# Patient Record
Sex: Female | Born: 2000
Health system: Southern US, Community
[De-identification: ages and names within clinical notes are randomized; demographics above are authoritative.]

## PROBLEM LIST (undated history)

## (undated) DIAGNOSIS — O139 Gestational [pregnancy-induced] hypertension without significant proteinuria, unspecified trimester: Secondary | ICD-10-CM

## (undated) DIAGNOSIS — E0590 Thyrotoxicosis, unspecified without thyrotoxic crisis or storm: Secondary | ICD-10-CM

## (undated) DIAGNOSIS — F419 Anxiety disorder, unspecified: Secondary | ICD-10-CM

## (undated) HISTORY — DX: Anxiety disorder, unspecified: F41.9

## (undated) HISTORY — PX: MYRINGOTOMY: SUR874

## (undated) HISTORY — PX: ANKLE RECONSTRUCTION: SHX1151

## (undated) HISTORY — DX: Gestational (pregnancy-induced) hypertension without significant proteinuria, unspecified trimester: O13.9

## (undated) HISTORY — DX: Thyrotoxicosis, unspecified without thyrotoxic crisis or storm: E05.90

---
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
## 2020-01-10 ENCOUNTER — Telehealth: Payer: BC Managed Care – PPO

## 2020-01-10 ENCOUNTER — Telehealth (INDEPENDENT_AMBULATORY_CARE_PROVIDER_SITE_OTHER): Payer: BC Managed Care – PPO | Admitting: Family

## 2020-01-10 ENCOUNTER — Encounter: Payer: Self-pay | Admitting: Family

## 2020-01-10 DIAGNOSIS — J029 Acute pharyngitis, unspecified: Secondary | ICD-10-CM | POA: Diagnosis not present

## 2020-01-10 MED ORDER — AMOXICILLIN 875 MG PO TABS: 875.0000 mg | ORAL_TABLET | Freq: Two times a day (BID) | ORAL | 0 refills | Status: DC

## 2020-01-10 NOTE — Progress Notes (Addendum)
   Virtual Visit via telephone Note Due to COVID-19 pandemic this visit was conducted virtually. This visit type was conducted due to national recommendations for restrictions regarding the COVID-19 Pandemic (e.g. social distancing, sheltering in place) in an effort to limit this patient's exposure and mitigate transmission in our community. All issues noted in this document were discussed and addressed.  A physical exam was not performed with this format.  I connected with Kristen Mejia on 01/10/20 at 9:40 AM  by video and verified that I am speaking with the correct person using two identifiers. Kristen Mejia is currently located at home and no one is currently with her during visit. The provider, [name], FNP is located in their office at time of visit.  I discussed the limitations, risks, security and privacy concerns of performing an evaluation and management service by telephone and the availability of in person appointments. I also discussed with the patient that there may be a patient responsible charge related to this service. The patient expressed understanding and agreed to proceed.   History and Present Illness:  Sore Throat  This is a new problem. The current episode started yesterday. The problem has been gradually worsening. The pain is worse on the right side. There has been no fever. The pain is at a severity of 5/10. The pain is moderate. Associated symptoms include congestion, coughing, headaches, swollen glands and trouble swallowing. She has tried acetaminophen for the symptoms. The treatment provided mild relief.      Review of Systems  HENT: Positive for congestion and trouble swallowing.   Respiratory: Positive for cough.   Neurological: Positive for headaches.  All other systems reviewed and are negative.    Observations/Objective: No SOB or distress noted,   Assessment and Plan: 1. Acute pharyngitis, unspecified etiology - Take meds as prescribed - Use a  cool mist humidifier  -Use saline nose sprays frequently -Force fluids -For any cough or congestion  Use plain Mucinex- regular strength or max strength is fine -For fever or aces or pains- take tylenol or ibuprofen. -Throat lozenges if help -New toothbrush in 3 days RTO if symptoms worsen or do not improve  - amoxicillin (AMOXIL) 875 MG tablet; Take 1 tablet (875 mg total) by mouth 2 (two) times daily.  Dispense: 14 tablet; Refill: 0    I discussed the assessment and treatment plan with the patient. The patient was provided an opportunity to ask questions and all were answered. The patient agreed with the plan and demonstrated an understanding of the instructions.   The patient was advised to call back or seek an in-person evaluation if the symptoms worsen or if the condition fails to improve as anticipated.  The above assessment and management plan was discussed with the patient. The patient verbalized understanding of and has agreed to the management plan. Patient is aware to call the clinic if symptoms persist or worsen. Patient is aware when to return to the clinic for a follow-up visit. Patient educated on when it is appropriate to go to the emergency department.   Time call ended: 9:50 AM     I provided 10 minutes of -face-to-face time during this encounter.    [name], FNP

## 2020-01-11 ENCOUNTER — Other Ambulatory Visit: Payer: Self-pay | Admitting: Family

## 2020-01-12 DIAGNOSIS — J984 Other disorders of lung: Secondary | ICD-10-CM | POA: Diagnosis not present

## 2020-01-12 DIAGNOSIS — J029 Acute pharyngitis, unspecified: Secondary | ICD-10-CM | POA: Diagnosis not present

## 2020-01-12 DIAGNOSIS — R05 Cough: Secondary | ICD-10-CM | POA: Diagnosis not present

## 2020-01-15 ENCOUNTER — Telehealth: Payer: Self-pay | Admitting: Family

## 2020-01-15 NOTE — Telephone Encounter (Signed)
I sent a work note to Pharmacologist.

## 2020-01-15 NOTE — Telephone Encounter (Signed)
NA to let pt know- VM full

## 2020-01-30 ENCOUNTER — Ambulatory Visit: Payer: BC Managed Care – PPO

## 2020-01-31 ENCOUNTER — Telehealth (INDEPENDENT_AMBULATORY_CARE_PROVIDER_SITE_OTHER): Payer: BC Managed Care – PPO | Admitting: Family Medicine

## 2020-01-31 ENCOUNTER — Encounter: Payer: Self-pay | Admitting: Family Medicine

## 2020-01-31 DIAGNOSIS — A084 Viral intestinal infection, unspecified: Secondary | ICD-10-CM | POA: Diagnosis not present

## 2020-01-31 MED ORDER — ONDANSETRON 4 MG PO TBDP: 4.0000 mg | ORAL_TABLET | Freq: Three times a day (TID) | ORAL | 0 refills | Status: DC | PRN

## 2020-01-31 NOTE — Progress Notes (Signed)
Virtual Visit via Video note  I connected with Kristen Mejia on 01/31/20 at 5:15 PM by video and verified that I am speaking with the correct person using two identifiers. Kristen Mejia is currently located at home and nobody is currently with her during visit. The provider, Loman Brooklyn, FNP is located in their office at time of visit.  I discussed the limitations, risks, security and privacy concerns of performing an evaluation and management service by video and the availability of in person appointments. I also discussed with the patient that there may be a patient responsible charge related to this service. The patient expressed understanding and agreed to proceed.  Subjective: PCP: Sharion Balloon, FNP  Chief Complaint  Patient presents with  . Abdominal Pain  . Emesis   Patient reports vomiting that started Monday night, 3 days ago.  She has also had a fever 100-101, which has resolved as of today.  Diarrhea started yesterday.  She has not taken any medications at home.   ROS: Per HPI  Current Outpatient Medications:  .  ondansetron (ZOFRAN ODT) 4 MG disintegrating tablet, Take 1 tablet (4 mg total) by mouth every 8 (eight) hours as needed for nausea or vomiting., Disp: 30 tablet, Rfl: 0  No Known Allergies History reviewed. No pertinent past medical history.  Observations/Objective: Physical Exam Constitutional:      General: She is not in acute distress.    Appearance: Normal appearance. She is not ill-appearing or toxic-appearing.  Eyes:     General: No scleral icterus.       Right eye: No discharge.        Left eye: No discharge.     Conjunctiva/sclera: Conjunctivae normal.  Pulmonary:     Effort: Pulmonary effort is normal. No respiratory distress.  Neurological:     Mental Status: She is alert and oriented to person, place, and time.  Psychiatric:        Mood and Affect: Mood normal.        Behavior: Behavior normal.        Thought Content: Thought  content normal.        Judgment: Judgment normal.    Assessment and Plan: 1. Viral gastroenteritis - Advised to stop N/V with Zofran so that she can stay hydrated but not to try to stop her diarrhea.  Tylenol/ibuprofen for fever.  Note provided for her as she has missed work since Monday.  She does plan to return tomorrow. - ondansetron (ZOFRAN ODT) 4 MG disintegrating tablet; Take 1 tablet (4 mg total) by mouth every 8 (eight) hours as needed for nausea or vomiting.  Dispense: 30 tablet; Refill: 0   Follow Up Instructions:   I discussed the assessment and treatment plan with the patient. The patient was provided an opportunity to ask questions and all were answered. The patient agreed with the plan and demonstrated an understanding of the instructions.   The patient was advised to call back or seek an in-person evaluation if the symptoms worsen or if the condition fails to improve as anticipated.  The above assessment and management plan was discussed with the patient. The patient verbalized understanding of and has agreed to the management plan. Patient is aware to call the clinic if symptoms persist or worsen. Patient is aware when to return to the clinic for a follow-up visit. Patient educated on when it is appropriate to go to the emergency department.   Time call ended: 5:24 PM  I provided  11 minutes of face-to-face time during this encounter.   Deliah Boston, MSN, APRN, FNP-C [facility] 01/31/20

## 2020-02-01 ENCOUNTER — Ambulatory Visit: Payer: BC Managed Care – PPO | Admitting: Physician Assistant

## 2020-02-01 ENCOUNTER — Telehealth: Payer: Self-pay | Admitting: Family

## 2020-02-01 ENCOUNTER — Encounter: Payer: Self-pay | Admitting: Family Medicine

## 2020-02-01 ENCOUNTER — Encounter: Payer: Self-pay | Admitting: *Deleted

## 2020-02-01 NOTE — Telephone Encounter (Signed)
Request for extended note sent through My Chart message also.

## 2020-02-01 NOTE — Telephone Encounter (Signed)
Note ready

## 2020-02-01 NOTE — Telephone Encounter (Signed)
Ok to extend note 

## 2020-02-02 DIAGNOSIS — R531 Weakness: Secondary | ICD-10-CM | POA: Diagnosis not present

## 2020-02-02 DIAGNOSIS — R000 Tachycardia, unspecified: Secondary | ICD-10-CM | POA: Diagnosis not present

## 2020-02-02 DIAGNOSIS — E860 Dehydration: Secondary | ICD-10-CM | POA: Diagnosis not present

## 2020-02-02 DIAGNOSIS — K2900 Acute gastritis without bleeding: Secondary | ICD-10-CM | POA: Diagnosis not present

## 2020-02-02 DIAGNOSIS — R1013 Epigastric pain: Secondary | ICD-10-CM | POA: Diagnosis not present

## 2020-02-02 DIAGNOSIS — K920 Hematemesis: Secondary | ICD-10-CM | POA: Diagnosis not present

## 2020-02-02 DIAGNOSIS — R112 Nausea with vomiting, unspecified: Secondary | ICD-10-CM | POA: Diagnosis not present

## 2020-02-05 ENCOUNTER — Other Ambulatory Visit: Payer: Self-pay | Admitting: Family

## 2020-02-05 DIAGNOSIS — R112 Nausea with vomiting, unspecified: Secondary | ICD-10-CM | POA: Diagnosis not present

## 2020-02-05 DIAGNOSIS — R109 Unspecified abdominal pain: Secondary | ICD-10-CM | POA: Diagnosis not present

## 2020-02-12 ENCOUNTER — Encounter: Payer: Self-pay | Admitting: Internal Medicine

## 2020-02-18 ENCOUNTER — Encounter: Payer: Self-pay | Admitting: Family Medicine

## 2020-02-18 ENCOUNTER — Ambulatory Visit (INDEPENDENT_AMBULATORY_CARE_PROVIDER_SITE_OTHER): Payer: BC Managed Care – PPO | Admitting: Family Medicine

## 2020-02-18 ENCOUNTER — Other Ambulatory Visit: Payer: Self-pay

## 2020-02-18 ENCOUNTER — Other Ambulatory Visit (HOSPITAL_COMMUNITY)
Admission: RE | Admit: 2020-02-18 | Discharge: 2020-02-18 | Disposition: A | Discharge status: Home / Self Care | Payer: BC Managed Care – PPO | Source: Ambulatory Visit | Attending: Family Medicine | Admitting: Family Medicine

## 2020-02-18 VITALS — BP 112/72 | HR 87 | Temp 98.0°F | Resp 20 | Ht 62.0 in | Wt 130.2 lb

## 2020-02-18 DIAGNOSIS — N898 Other specified noninflammatory disorders of vagina: Secondary | ICD-10-CM | POA: Diagnosis not present

## 2020-02-18 NOTE — Progress Notes (Signed)
Chief Complaint  Patient presents with  . Vaginal irritation    HPI  Patient presents today for vaginal itching for 2 to 3 days. Last sexual activity was 3 to 4 days ago with no protection. She has been with the same guy for 2 to 3 years. She is on her period now. Denies discharge. Denies dysuria.  PMH: Smoking status noted ROS: Per HPI  Objective: BP 112/72   Pulse 87   Temp 98 F (36.7 C) (Temporal)   Resp 20   Ht 5\' 2"  (1.575 m)   Wt 130 lb 4 oz (59.1 kg)   SpO2 99%   BMI 23.82 kg/m  Gen: NAD, alert, cooperative with exam HEENT: NCAT, EOMI, PERRL CV: RRR, good S1/S2, no murmur Resp: CTABL, no wheezes, non-labored Genitourinary: Pelvic exam shows moderate blood in the vaginal vault with small amounts coming from the cervix. Cervical specimen was obtained for evaluation. Ext: No edema, warm Neuro: Alert and oriented, No gross deficits  Assessment and plan:  1. Vaginal irritation     No orders of the defined types were placed in this encounter.   Orders Placed This Encounter  Procedures  . WET PREP FOR TRICH, YEAST, CLUE   For now she should use pads instead of tampons. Report any change in her condition. Will contact her with test results and treatment as needed. Follow up as needed.  , MD

## 2020-02-19 LAB — WET PREP FOR TRICH, YEAST, CLUE
Clue Cell Exam: NEGATIVE
Trichomonas Exam: NEGATIVE
Yeast Exam: NEGATIVE

## 2020-02-20 LAB — GC/CHLAMYDIA PROBE AMP (~~LOC~~) NOT AT ARMC
Chlamydia: NEGATIVE
Comment: NEGATIVE
Comment: NORMAL
Neisseria Gonorrhea: NEGATIVE

## 2020-03-10 ENCOUNTER — Telehealth: Payer: Self-pay | Admitting: Family

## 2020-03-11 NOTE — Telephone Encounter (Signed)
See MyChart

## 2020-03-19 ENCOUNTER — Ambulatory Visit: Payer: BC Managed Care – PPO | Admitting: Internal Medicine

## 2020-03-19 ENCOUNTER — Encounter: Payer: Self-pay | Admitting: Internal Medicine

## 2020-04-11 ENCOUNTER — Other Ambulatory Visit: Payer: Self-pay | Admitting: Family

## 2020-04-11 DIAGNOSIS — Z202 Contact with and (suspected) exposure to infections with a predominantly sexual mode of transmission: Secondary | ICD-10-CM

## 2020-06-24 ENCOUNTER — Telehealth: Payer: Self-pay

## 2020-06-24 ENCOUNTER — Encounter: Payer: Self-pay | Admitting: Family

## 2020-06-24 ENCOUNTER — Ambulatory Visit (INDEPENDENT_AMBULATORY_CARE_PROVIDER_SITE_OTHER): Payer: BC Managed Care – PPO | Admitting: Family

## 2020-06-24 ENCOUNTER — Other Ambulatory Visit: Payer: Self-pay

## 2020-06-24 VITALS — BP 110/73 | HR 54 | Temp 97.5°F | Ht 62.0 in | Wt 128.2 lb

## 2020-06-24 DIAGNOSIS — Z23 Encounter for immunization: Secondary | ICD-10-CM

## 2020-06-24 DIAGNOSIS — B009 Herpesviral infection, unspecified: Secondary | ICD-10-CM

## 2020-06-24 DIAGNOSIS — F411 Generalized anxiety disorder: Secondary | ICD-10-CM

## 2020-06-24 MED ORDER — FLUOXETINE HCL 20 MG PO TABS: 20.0000 mg | ORAL_TABLET | Freq: Every day | ORAL | 3 refills | Status: DC

## 2020-06-24 MED ORDER — BUSPIRONE HCL 5 MG PO TABS: 5.0000 mg | ORAL_TABLET | Freq: Three times a day (TID) | ORAL | 1 refills | Status: DC | PRN

## 2020-06-24 NOTE — Telephone Encounter (Signed)
Covermymeds  No Prior Authorization is required at this time based on the alternative drug you chose to prescribe.;CaseId:[id number];Status:Cancelled;  Prozac/Fluoxetine.

## 2020-06-24 NOTE — Progress Notes (Signed)
   Subjective:    Patient ID: Kristen Mejia, female    DOB: 08-30-2000, 19 y.o.   MRN: [id number]  Chief Complaint  Patient presents with  . Anxiety    was on generic prozac and did not like    Pt presents to the office today to discuss GAD. She tried Lexapro, but states she did not feel like this really helped.   PT also requesting to retest STD panel. She had a positive HSV 2 and wants to make sure this is accurate.  Anxiety Presents for follow-up visit. Symptoms include excessive worry, irritability, nervous/anxious behavior and restlessness. Symptoms occur occasionally. The severity of symptoms is moderate.        Review of Systems  Constitutional: Positive for irritability.  Psychiatric/Behavioral: The patient is nervous/anxious.   All other systems reviewed and are negative.      Objective:   Physical Exam Vitals reviewed.  Constitutional:      General: She is not in acute distress.    Appearance: She is well-developed.  HENT:     Head: Normocephalic and atraumatic.     Right Ear: Tympanic membrane normal.     Left Ear: Tympanic membrane normal.  Eyes:     Pupils: Pupils are equal, round, and reactive to light.  Neck:     Thyroid: No thyromegaly.  Cardiovascular:     Rate and Rhythm: Normal rate and regular rhythm.     Heart sounds: Normal heart sounds. No murmur heard.   Pulmonary:     Effort: Pulmonary effort is normal. No respiratory distress.     Breath sounds: Normal breath sounds. No wheezing.  Abdominal:     General: Bowel sounds are normal. There is no distension.     Palpations: Abdomen is soft.     Tenderness: There is no abdominal tenderness.  Musculoskeletal:        General: No tenderness. Normal range of motion.     Cervical back: Normal range of motion and neck supple.  Skin:    General: Skin is warm and dry.  Neurological:     Mental Status: She is alert and oriented to person, place, and time.     Cranial Nerves: No cranial nerve deficit.      Deep Tendon Reflexes: Reflexes are normal and symmetric.  Psychiatric:        Behavior: Behavior normal.        Thought Content: Thought content normal.        Judgment: Judgment normal.       BP 110/73   Pulse (!) 54   Temp (!) 97.5 F (36.4 C) (Temporal)   Ht 5\' 2"  (1.575 m)   Wt 128 lb 3.2 oz (58.2 kg)   BMI 23.45 kg/m      Assessment & Plan:  Kristen Mejia comes in today with chief complaint of Anxiety (was on generic lexapro and did not like )   Diagnosis and orders addressed:  1. GAD (generalized anxiety disorder) Will start Prozac 20 mg today  Buspar 5 mg as needed Stress management  - FLUoxetine (PROZAC) 20 MG tablet; Take 1 tablet (20 mg total) by mouth daily.  Dispense: 90 tablet; Refill: 3 - busPIRone (BUSPAR) 5 MG tablet; Take 1 tablet (5 mg total) by mouth 3 (three) times daily as needed.  Dispense: 90 tablet; Refill: 1  2. Herpes  - STD Screen (8)  [name], FNP

## 2020-06-24 NOTE — Patient Instructions (Signed)

## 2020-06-25 ENCOUNTER — Telehealth: Payer: Self-pay | Admitting: *Deleted

## 2020-06-25 ENCOUNTER — Telehealth: Payer: Self-pay

## 2020-06-25 LAB — STD SCREEN (8)
HIV Screen 4th Generation wRfx: NONREACTIVE
HSV 1 Glycoprotein G Ab, IgG: <0.91 index (ref 0.00–0.90)
HSV 2 IgG, Type Spec: <0.91 index (ref 0.00–0.90)
Hep A IgM: NEGATIVE
Hep B C IgM: NEGATIVE
Hep C Virus Ab: 0.1 s/co ratio (ref 0.0–0.9)
Hepatitis B Surface Ag: NEGATIVE
RPR Ser Ql: NONREACTIVE

## 2020-06-25 MED ORDER — FLUOXETINE HCL 20 MG PO CAPS
20.0000 mg | ORAL_CAPSULE | Freq: Every day | ORAL | 1 refills | Status: DC
Start: 2020-06-25 — End: 2020-09-10

## 2020-06-25 NOTE — Addendum Note (Signed)
Addended by: [name] A on: 06/25/2020 01:08 PM   Modules accepted: Orders

## 2020-06-25 NOTE — Telephone Encounter (Signed)
Rx changed and sent to pharmacy.  °

## 2020-06-25 NOTE — Telephone Encounter (Signed)
PA is not needed for capsules, Rx is written for tablets Please advise on changing to capsules

## 2020-06-25 NOTE — Telephone Encounter (Signed)
See result note.  

## 2020-07-15 ENCOUNTER — Telehealth (INDEPENDENT_AMBULATORY_CARE_PROVIDER_SITE_OTHER): Payer: BC Managed Care – PPO | Admitting: Nurse Practitioner

## 2020-07-15 DIAGNOSIS — G43819 Other migraine, intractable, without status migrainosus: Secondary | ICD-10-CM

## 2020-07-15 MED ORDER — SUMATRIPTAN SUCCINATE 50 MG PO TABS
50.0000 mg | ORAL_TABLET | ORAL | 0 refills | Status: DC | PRN
Start: 2020-07-15 — End: 2020-09-10

## 2020-07-15 NOTE — Progress Notes (Signed)
Virtual Visit via video Note   Due to COVID-19 pandemic this visit was conducted virtually. This visit type was conducted due to national recommendations for restrictions regarding the COVID-19 Pandemic (e.g. social distancing, sheltering in place) in an effort to limit this patient's exposure and mitigate transmission in our community. All issues noted in this document were discussed and addressed.  A physical exam was not performed with this format.  I connected with  Kristen Mejia  on 07/15/20 at 1:05 by video and verified that I am speaking with the correct person using two identifiers. Kristen Mejia is currently located at home and nonoe is currently with her during visit. The provider, Mary-Margaret Daphine Deutscher, FNP is located in their office at time of visit.  I discussed the limitations, risks, security and privacy concerns of performing an evaluation and management service by video  and the availability of in person appointments. I also discussed with the patient that there may be a patient responsible charge related to this service. The patient expressed understanding and agreed to proceed.   History and Present Illness:   Chief Complaint: Migraine   HPI Patient being seen today for migraine. Started yesterday morning at about 6:30. She was on buspar and prozac and she accidently threw it away about 5 days ago and wonders if that is what has caused headache. She has been taking goody powders and BC , Excedrin and motrin and has not helped. Pain is located in the front toward the right temple . Rates pain 6/10 currently. Has slight nausea.  Review of Systems  Eyes: Positive for photophobia. Negative for blurred vision and double vision.  Respiratory: Negative.   Cardiovascular: Negative.   Neurological: Positive for dizziness and headaches.  Psychiatric/Behavioral: Negative.        Observations/Objective: Alert and oriented- answers all questions appropriately No  distress Pupils equal * lost video so could not do cranial nerve assessment   Assessment and Plan: Kristen Mejia in today with chief complaint of Migraine   1. Other migraine without status migrainosus, intractable Rest Turn lights off Cool rag  To head Meds ordered this encounter  Medications  . SUMAtriptan (IMITREX) 50 MG tablet    Sig: Take 1 tablet (50 mg total) by mouth every 2 (two) hours as needed for migraine. May repeat in 2 hours if headache persists or recurs.    Dispense:  10 tablet    Refill:  0    Order Specific Question:   Supervising Provider    Answer:   Arville Care A [[id number]]        Follow Up Instructions: prn    I discussed the assessment and treatment plan with the patient. The patient was provided an opportunity to ask questions and all were answered. The patient agreed with the plan and demonstrated an understanding of the instructions.   The patient was advised to call back or seek an in-person evaluation if the symptoms worsen or if the condition fails to improve as anticipated.  The above assessment and management plan was discussed with the patient. The patient verbalized understanding of and has agreed to the management plan. Patient is aware to call the clinic if symptoms persist or worsen. Patient is aware when to return to the clinic for a follow-up visit. Patient educated on when it is appropriate to go to the emergency department.   Time call ended: 1:17  I provided 12 minutes of face-to-face time during this encounter.    Mary-Margaret Daphine Deutscher, FNP

## 2020-07-17 ENCOUNTER — Telehealth: Payer: Self-pay | Admitting: Family

## 2020-07-17 MED ORDER — CHLORPHEN-PE-ACETAMINOPHEN 4-10-325 MG PO TABS
1.0000 | ORAL_TABLET | Freq: Four times a day (QID) | ORAL | 0 refills | Status: DC | PRN
Start: 2020-07-17 — End: 2020-09-10

## 2020-07-17 NOTE — Telephone Encounter (Signed)
Lest try norel AD and see if coming from sinus congestion- if no better let me know'

## 2020-07-17 NOTE — Telephone Encounter (Signed)
  Incoming Patient Call  07/17/2020  What symptoms do you have? Migraine and congestion   How long have you been sick? Monday morning 12/6  Have you been seen for this problem? Yes, for migraine 12/7 by MMM but not getting better with medicine she prescribed (sumatriptan)  If your provider decides to give you a prescription, which pharmacy would you like for it to be sent to? [facility]    Patient informed that this information will be sent to the clinical staff for review and that they should receive a follow up call.

## 2020-07-22 NOTE — Telephone Encounter (Signed)
NA  No call back - this encounter will be closed.

## 2020-08-07 ENCOUNTER — Ambulatory Visit: Payer: BC Managed Care – PPO | Admitting: Nurse Practitioner

## 2020-08-21 ENCOUNTER — Other Ambulatory Visit: Payer: Self-pay | Admitting: Family

## 2020-08-21 DIAGNOSIS — F411 Generalized anxiety disorder: Secondary | ICD-10-CM

## 2020-09-10 ENCOUNTER — Other Ambulatory Visit: Payer: Self-pay

## 2020-09-10 ENCOUNTER — Ambulatory Visit (INDEPENDENT_AMBULATORY_CARE_PROVIDER_SITE_OTHER): Payer: BC Managed Care – PPO | Admitting: Family Medicine

## 2020-09-10 ENCOUNTER — Ambulatory Visit (INDEPENDENT_AMBULATORY_CARE_PROVIDER_SITE_OTHER): Payer: BC Managed Care – PPO

## 2020-09-10 ENCOUNTER — Ambulatory Visit: Payer: BC Managed Care – PPO | Admitting: Nurse Practitioner

## 2020-09-10 ENCOUNTER — Ambulatory Visit (HOSPITAL_COMMUNITY)
Admission: RE | Admit: 2020-09-10 | Discharge: 2020-09-10 | Disposition: A | Discharge status: Home / Self Care | Payer: BC Managed Care – PPO | Source: Ambulatory Visit | Attending: Family Medicine | Admitting: Family Medicine

## 2020-09-10 ENCOUNTER — Encounter: Payer: Self-pay | Admitting: Family Medicine

## 2020-09-10 VITALS — BP 103/58 | HR 67 | Temp 97.8°F | Ht 62.0 in | Wt 128.4 lb

## 2020-09-10 DIAGNOSIS — N200 Calculus of kidney: Secondary | ICD-10-CM | POA: Diagnosis not present

## 2020-09-10 DIAGNOSIS — R109 Unspecified abdominal pain: Secondary | ICD-10-CM

## 2020-09-10 DIAGNOSIS — R194 Change in bowel habit: Secondary | ICD-10-CM | POA: Diagnosis not present

## 2020-09-10 DIAGNOSIS — R112 Nausea with vomiting, unspecified: Secondary | ICD-10-CM | POA: Insufficient documentation

## 2020-09-10 DIAGNOSIS — N134 Hydroureter: Secondary | ICD-10-CM | POA: Diagnosis not present

## 2020-09-10 DIAGNOSIS — N210 Calculus in bladder: Secondary | ICD-10-CM | POA: Diagnosis not present

## 2020-09-10 DIAGNOSIS — N201 Calculus of ureter: Secondary | ICD-10-CM | POA: Diagnosis not present

## 2020-09-10 LAB — MICROSCOPIC EXAMINATION: RBC, Urine: >30 /hpf — AB (ref 0–2)

## 2020-09-10 LAB — URINALYSIS, COMPLETE
Bilirubin, UA: NEGATIVE
Glucose, UA: NEGATIVE
Ketones, UA: NEGATIVE
Leukocytes,UA: NEGATIVE
Nitrite, UA: NEGATIVE
Specific Gravity, UA: 1.025 (ref 1.005–1.030)
Urobilinogen, Ur: 0.2 mg/dL (ref 0.2–1.0)
pH, UA: 7 (ref 5.0–7.5)

## 2020-09-10 MED ORDER — ONDANSETRON HCL 4 MG PO TABS: 4.0000 mg | ORAL_TABLET | Freq: Three times a day (TID) | ORAL | 0 refills | Status: DC | PRN

## 2020-09-10 MED ORDER — TAMSULOSIN HCL 0.4 MG PO CAPS: 0.4000 mg | ORAL_CAPSULE | Freq: Every day | ORAL | 3 refills | Status: DC

## 2020-09-10 MED ORDER — TRAMADOL HCL 50 MG PO TABS: 50.0000 mg | ORAL_TABLET | Freq: Three times a day (TID) | ORAL | 0 refills | Status: AC | PRN

## 2020-09-10 NOTE — Progress Notes (Signed)
Established Patient Office Visit  Subjective:  Patient ID: Kristen Mejia, female    DOB: [birth year]  Age: 20 y.o. MRN: [id number]  CC:  Chief Complaint  Patient presents with  . Flank Pain    HPI Kristen Mejia presents for right flank pain.  Kristen Mejia reports right flank pain for 2-3 days. The pain is intermittent and a dull achy pain. She reports that the pain has gotten worse. Pain is 5/10. Denies urinary symptoms, fever, or blood in urine. She also reports abdominal pain in the center for 4-5 days. The pain is crampy and constant. It is a 4. She is not sure if it radiates. She also reports nausea and two episodes of non bilious, non bloody vomiting. She denies diarrhea. She has been constipated for about a week, last BM was 2 days ago.    History reviewed. No pertinent past medical history.  Past Surgical History:  Procedure Laterality Date  . ANKLE RECONSTRUCTION Left     History reviewed. No pertinent family history.  Social History   Socioeconomic History  . Marital status: Single    Spouse name: Not on file  . Number of children: Not on file  . Years of education: Not on file  . Highest education level: Not on file  Occupational History  . Not on file  Tobacco Use  . Smoking status: Passive Smoke Exposure - Never Smoker  . Smokeless tobacco: Never Used  Vaping Use  . Vaping Use: Never used  Substance and Sexual Activity  . Alcohol use: No  . Drug use: No  . Sexual activity: Not Currently  Other Topics Concern  . Not on file  Social History Narrative  . Not on file   Social Determinants of Health   Financial Resource Strain: Not on file  Food Insecurity: Not on file  Transportation Needs: Not on file  Physical Activity: Not on file  Stress: Not on file  Social Connections: Not on file  Intimate Partner Violence: Not on file    Outpatient Medications Prior to Visit  Medication Sig Dispense Refill  . busPIRone (BUSPAR) 5 MG tablet Take 1 tablet (5 mg  total) by mouth 3 (three) times daily as needed. (Needs to be seen before next refill) 90 tablet 0  . Chlorphen-PE-Acetaminophen 4-10-325 MG TABS Take 1 tablet by mouth every 6 (six) hours as needed. 20 tablet 0  . FLUoxetine (PROZAC) 20 MG capsule Take 1 capsule (20 mg total) by mouth daily. 90 capsule 1  . SUMAtriptan (IMITREX) 50 MG tablet Take 1 tablet (50 mg total) by mouth every 2 (two) hours as needed for migraine. May repeat in 2 hours if headache persists or recurs. 10 tablet 0   No facility-administered medications prior to visit.    No Known Allergies  ROS Review of Systems Negative unless specially indicated above in HPI.   Objective:    Physical Exam Vitals and nursing note reviewed.  Constitutional:      Appearance: Normal appearance. She is not toxic-appearing or diaphoretic.  Cardiovascular:     Rate and Rhythm: Normal rate and regular rhythm.     Pulses: Normal pulses.     Heart sounds: Normal heart sounds.  Pulmonary:     Effort: Pulmonary effort is normal. No respiratory distress.     Breath sounds: Normal breath sounds. No wheezing or rales.  Abdominal:     General: Abdomen is flat. There is no distension.     Palpations: Abdomen is soft. There  is no shifting dullness, fluid wave, mass or pulsatile mass.     Tenderness: There is abdominal tenderness in the right upper quadrant and right lower quadrant. There is right CVA tenderness. There is no guarding or rebound. Negative signs include Murphy's sign, Rovsing's sign, McBurney's sign, psoas sign and obturator sign.     Hernia: No hernia is present.  Musculoskeletal:     Right lower leg: No edema.     Left lower leg: No edema.  Skin:    General: Skin is warm and dry.     Capillary Refill: Capillary refill takes less than 2 seconds.  Neurological:     Mental Status: She is alert and oriented to person, place, and time.  Psychiatric:        Mood and Affect: Mood normal.        Behavior: Behavior normal.    Urine dipstick shows positive for RBC's.  Micro exam: 0-5 WBC's per HPF, >30 RBC's per HPF and few bacteria.   BP (!) 103/58   Pulse 67   Temp 97.8 F (36.6 C) (Temporal)   Ht 5' 2"  (1.575 m)   Wt 128 lb 6 oz (58.2 kg)   LMP 08/27/2020   BMI 23.48 kg/m  Wt Readings from Last 3 Encounters:  09/10/20 128 lb 6 oz (58.2 kg)  06/24/20 128 lb 3.2 oz (58.2 kg) (50 %, Z= 0.01)*  02/18/20 130 lb 4 oz (59.1 kg) (55 %, Z= 0.13)*   * Growth percentiles are based on CDC (Girls, 2-20 Years) data.     There are no preventive care reminders to display for this patient.  There are no preventive care reminders to display for this patient.  Lab Results  Component Value Date   TSH 1.500 10/01/2014   Lab Results  Component Value Date   WBC 7.1 06/06/2018   HGB 11.8 06/06/2018   HCT 36.1 06/06/2018   MCV 84 06/06/2018   PLT 376 06/06/2018   Lab Results  Component Value Date   NA 140 10/01/2014   K 4.1 10/01/2014   CO2 23 10/01/2014   GLUCOSE 78 10/01/2014   BUN 10 10/01/2014   CREATININE 0.69 10/01/2014   BILITOT 0.8 10/01/2014   ALKPHOS 115 10/01/2014   AST 18 10/01/2014   ALT 15 10/01/2014   PROT 7.3 10/01/2014   ALBUMIN 4.6 10/01/2014   CALCIUM 9.4 10/01/2014   No results found for: CHOL No results found for: HDL No results found for: LDLCALC No results found for: TRIG No results found for: CHOLHDL No results found for: HGBA1C    Assessment & Plan:    Kristen Mejia was seen today for flank pain.  Diagnoses and all orders for this visit:  Flank pain Concern for kidney stone. CT scan ordered stat. Flomax daily, tramadol as needed. Push fluids. -     Urinalysis, Complete -     tamsulosin (FLOMAX) 0.4 MG CAPS capsule; Take 1 capsule (0.4 mg total) by mouth daily. -     traMADol (ULTRAM) 50 MG tablet; Take 1 tablet (50 mg total) by mouth every 8 (eight) hours as needed for up to 5 days. -     CT RENAL STONE STUDY; Future -     Microscopic Examination  Acute abdominal  pain Tenderness on exam. CT ordered stat. Labs as below.  -     CBC with Differential/Platelet -     CMP14+EGFR -     Lipase -     CT RENAL ABD  W/WO; Future -     traMADol (ULTRAM) 50 MG tablet; Take 1 tablet (50 mg total) by mouth every 8 (eight) hours as needed for up to 5 days.  Change in stool habits Normal KUB today in office.  -     DG Abd 1 View; Future  Non-intractable vomiting with nausea, unspecified vomiting type Labs pending as below. CT order for abdominal tenderness and possible renal stone. Zofran as needed for nausea. Stay well hydrated.  -     CBC with Differential/Platelet -     CMP14+EGFR -     Lipase -     CT RENAL ABD W/WO; Future -     ondansetron (ZOFRAN) 4 MG tablet; Take 1 tablet (4 mg total) by mouth every 8 (eight) hours as needed for nausea or vomiting. -     CT RENAL STONE STUDY; Future  Follow-up: Return if symptoms worsen or fail to improve.   The patient indicates understanding of these issues and agrees with the plan.    Gwenlyn Perking, FNP

## 2020-09-10 NOTE — Addendum Note (Signed)
Addended by: Gabriel Earing on: 09/10/2020 08:01 PM   Modules accepted: Level of Service

## 2020-09-10 NOTE — Patient Instructions (Addendum)
Abdominal Pain, Adult Pain in the abdomen (abdominal pain) can be caused by many things. Often, abdominal pain is not serious and it gets better with no treatment or by being treated at home. However, sometimes abdominal pain is serious. Your health care provider will ask questions about your medical history and do a physical exam to try to determine the cause of your abdominal pain. Follow these instructions at home: Medicines  Take over-the-counter and prescription medicines only as told by your health care provider.  Do not take a laxative unless told by your health care provider. General instructions  Watch your condition for any changes.  Drink enough fluid to keep your urine pale yellow.  Keep all follow-up visits as told by your health care provider. This is important.   Contact a health care provider if:  Your abdominal pain changes or gets worse.  You are not hungry or you lose weight without trying.  You are constipated or have diarrhea for more than 2-3 days.  You have pain when you urinate or have a bowel movement.  Your abdominal pain wakes you up at night.  Your pain gets worse with meals, after eating, or with certain foods.  You are vomiting and cannot keep anything down.  You have a fever.  You have blood in your urine. Get help right away if:  Your pain does not go away as soon as your health care provider told you to expect.  You cannot stop vomiting.  Your pain is only in areas of the abdomen, such as the right side or the left lower portion of the abdomen. Pain on the right side could be caused by appendicitis.  You have bloody or black stools, or stools that look like tar.  You have severe pain, cramping, or bloating in your abdomen.  You have signs of dehydration, such as: ? Dark urine, very little urine, or no urine. ? Cracked lips. ? Dry mouth. ? Sunken eyes. ? Sleepiness. ? Weakness.  You have trouble breathing or chest  pain. Summary  Often, abdominal pain is not serious and it gets better with no treatment or by being treated at home. However, sometimes abdominal pain is serious.  Watch your condition for any changes.  Kidney Stones  Kidney stones are solid, rock-like deposits that form inside of the kidneys. The kidneys are a pair of organs that make urine. A kidney stone may form in a kidney and move into other parts of the urinary tract, including the tubes that connect the kidneys to the bladder (ureters), the bladder, and the tube that carries urine out of the body (urethra). As the stone moves through these areas, it can cause intense pain and block the flow of urine. Kidney stones are created when high levels of certain minerals are found in the urine. The stones are usually passed out of the body through urination, but in some cases, medical treatment may be needed to remove them. What are the causes? Kidney stones may be caused by: A condition in which certain glands produce too much parathyroid hormone (primary hyperparathyroidism), which causes too much calcium buildup in the blood. A buildup of uric acid crystals in the bladder (hyperuricosuria). Uric acid is a chemical that the body produces when you eat certain foods. It usually exits the body in the urine. Narrowing (stricture) of one or both of the ureters. A kidney blockage that is present at birth (congenital obstruction). Past surgery on the kidney or the ureters, such  as gastric bypass surgery. What increases the risk? The following factors may make you more likely to develop this condition: Having had a kidney stone in the past. Having a family history of kidney stones. Not drinking enough water. Eating a diet that is high in protein, salt (sodium), or sugar. Being overweight or obese. What are the signs or symptoms? Symptoms of a kidney stone may include: Pain in the side of the abdomen, right below the ribs (flank pain). Pain  usually spreads (radiates) to the groin. Needing to urinate frequently or urgently. Painful urination. Blood in the urine (hematuria). Nausea. Vomiting. Fever and chills. How is this diagnosed? This condition may be diagnosed based on: Your symptoms and medical history. A physical exam. Blood tests. Urine tests. These may be done before and after the stone passes out of your body through urination. Imaging tests, such as a CT scan, abdominal X-ray, or ultrasound. A procedure to examine the inside of the bladder (cystoscopy). How is this treated? Treatment for kidney stones depends on the size, location, and makeup of the stones. Kidney stones will often pass out of the body through urination. You may need to: Increase your fluid intake to help pass the stone. In some cases, you may be given fluids through an IV and may need to be monitored at the hospital. Take medicine for pain. Make changes in your diet to help prevent kidney stones from coming back. Sometimes, medical procedures are needed to remove a kidney stone. This may involve: A procedure to break up kidney stones using: A focused beam of light (laser therapy). Shock waves (extracorporeal shock wave lithotripsy). Surgery to remove kidney stones. This may be needed if you have severe pain or have stones that block your urinary tract. Follow these instructions at home: Medicines Take over-the-counter and prescription medicines only as told by your health care provider. Ask your health care provider if the medicine prescribed to you requires you to avoid driving or using heavy machinery. Eating and drinking Drink enough fluid to keep your urine pale yellow. You may be instructed to drink at least 8-10 glasses of water each day. This will help you pass the kidney stone. If directed, change your diet. This may include: Limiting how much sodium you eat. Eating more fruits and vegetables. Limiting how much animal protein--such as  red meat, poultry, fish, and eggs--you eat. Follow instructions from your health care provider about eating or drinking restrictions. General instructions Collect urine samples as told by your health care provider. You may need to collect a urine sample: 24 hours after you pass the stone. 8-12 weeks after passing the kidney stone, and every 6-12 months after that. Strain your urine every time you urinate, for as long as directed. Use the strainer that your health care provider recommends. Do not throw out the kidney stone after passing it. Keep the stone so it can be tested by your health care provider. Testing the makeup of your kidney stone may help prevent you from getting kidney stones in the future. Keep all follow-up visits as told by your health care provider. This is important. You may need follow-up X-rays or ultrasounds to make sure that your stone has passed. How is this prevented? To prevent another kidney stone: Drink enough fluid to keep your urine pale yellow. This is the best way to prevent kidney stones. Eat a healthy diet and follow recommendations from your health care provider about foods to avoid. You may be instructed to eat  a low-protein diet. Recommendations vary depending on the type of kidney stone that you have. Maintain a healthy weight.   Where to find more information National Kidney Foundation (NKF): www.kidney.org [facility]): www.urologyhealth.org Contact a health care provider if: You have pain that gets worse or does not get better with medicine. Get help right away if: You have a fever or chills. You develop severe pain. You develop new abdominal pain. You faint. You are unable to urinate. Summary Kidney stones are solid, rock-like deposits that form inside of the kidneys. Kidney stones can cause nausea, vomiting, blood in the urine, abdominal pain, and the urge to urinate frequently. Treatment for kidney stones depends on the size,  location, and makeup of the stones. Kidney stones will often pass out of the body through urination. Kidney stones can be prevented by drinking enough fluids, eating a healthy diet, and maintaining a healthy weight. This information is not intended to replace advice given to you by your health care provider. Make sure you discuss any questions you have with your health care provider. Document Revised: 12/12/2018 Document Reviewed: 12/12/2018 Elsevier Patient Education  2021 Elsevier Inc.  Take over-the-counter and prescription medicines only as told by your health care provider.  Contact a health care provider if your abdominal pain changes or gets worse.  Get help right away if you have severe pain, cramping, or bloating in your abdomen. This information is not intended to replace advice given to you by your health care provider. Make sure you discuss any questions you have with your health care provider. Document Revised: 09/14/2019 Document Reviewed: 12/04/2018 Elsevier Patient Education  2021 [employer].

## 2020-09-11 LAB — CMP14+EGFR
ALT: 15 IU/L (ref 0–32)
AST: 20 IU/L (ref 0–40)
Albumin/Globulin Ratio: 2 (ref 1.2–2.2)
Albumin: 4.6 g/dL (ref 3.9–5.0)
Alkaline Phosphatase: 70 IU/L (ref 42–106)
BUN/Creatinine Ratio: 14 (ref 9–23)
BUN: 10 mg/dL (ref 6–20)
Bilirubin Total: 0.8 mg/dL (ref 0.0–1.2)
CO2: 22 mmol/L (ref 20–29)
Calcium: 9.5 mg/dL (ref 8.7–10.2)
Chloride: 105 mmol/L (ref 96–106)
Creatinine, Ser: 0.73 mg/dL (ref 0.57–1.00)
GFR calc Af Amer: 137 mL/min/{1.73_m2} (ref >59)
GFR calc non Af Amer: 119 mL/min/{1.73_m2} (ref >59)
Globulin, Total: 2.3 g/dL (ref 1.5–4.5)
Glucose: 81 mg/dL (ref 65–99)
Potassium: 5.1 mmol/L (ref 3.5–5.2)
Sodium: 141 mmol/L (ref 134–144)
Total Protein: 6.9 g/dL (ref 6.0–8.5)

## 2020-09-11 LAB — CBC WITH DIFFERENTIAL/PLATELET
Basophils Absolute: 0 10*3/uL (ref 0.0–0.2)
Basos: 1 %
EOS (ABSOLUTE): 0.5 10*3/uL — ABNORMAL HIGH (ref 0.0–0.4)
Eos: 7 %
Hematocrit: 36.4 % (ref 34.0–46.6)
Hemoglobin: 12.3 g/dL (ref 11.1–15.9)
Immature Grans (Abs): 0.1 10*3/uL (ref 0.0–0.1)
Immature Granulocytes: 1 %
Lymphocytes Absolute: 1.8 10*3/uL (ref 0.7–3.1)
Lymphs: 23 %
MCH: 30.4 pg (ref 26.6–33.0)
MCHC: 33.8 g/dL (ref 31.5–35.7)
MCV: 90 fL (ref 79–97)
Monocytes Absolute: 0.5 10*3/uL (ref 0.1–0.9)
Monocytes: 7 %
Neutrophils Absolute: 4.8 10*3/uL (ref 1.4–7.0)
Neutrophils: 61 %
Platelets: 288 10*3/uL (ref 150–450)
RBC: 4.05 x10E6/uL (ref 3.77–5.28)
RDW: 13.6 % (ref 11.7–15.4)
WBC: 7.7 10*3/uL (ref 3.4–10.8)

## 2020-09-11 LAB — LIPASE: Lipase: 31 U/L (ref 14–72)

## 2020-10-12 NOTE — Addendum Note (Signed)
Encounter addended by: Novella Olive on: 10/12/2020 12:43 PM  Actions taken: Letter saved

## 2020-10-16 ENCOUNTER — Telehealth: Payer: Self-pay

## 2020-10-16 NOTE — Telephone Encounter (Signed)
Pt called stating that she received a letter in the mail regarding her CT Scan results from 09/10/20 and wanted to know if she needed to make a follow up appt with her PCP.  Reviewed CT Scan results with pt and pt said she has not picked up/taken the flomax Rx that was sent to pharmacy for her. Pt is going to call pharmacy to make sure they still have Rx for her to pick up.  Will call back if there is any issues. Says she is still "kind of" having the same symptoms, but are not as bad.

## 2020-10-16 NOTE — Telephone Encounter (Signed)
Tried to call pt but no answer and voicemail was full

## 2020-10-16 NOTE — Telephone Encounter (Signed)
Per office policy - patient has to been seen if requesting any time of controlled substance Please call patient to scheduled

## 2020-10-16 NOTE — Telephone Encounter (Signed)
No answer, mailbox full

## 2020-10-16 NOTE — Telephone Encounter (Signed)
Pt aware she needs an apt for pain medication. Pt is asking what can she take OTC for pain. Please call back

## 2020-10-16 NOTE — Telephone Encounter (Signed)
Pt called back and said they found a kidney stone on the other side and she would like pain meds

## 2020-10-17 NOTE — Telephone Encounter (Signed)
Attempted to contact patient - NA, mailboxfull  Per office policy she NTBS if wanting pain medication  Please schedule

## 2020-10-17 NOTE — Telephone Encounter (Signed)
Patient is taking Ibuprofen.

## 2020-10-18 DIAGNOSIS — Z20822 Contact with and (suspected) exposure to covid-19: Secondary | ICD-10-CM | POA: Diagnosis not present

## 2020-10-18 DIAGNOSIS — Z110 Encounter for screening for intestinal infectious diseases: Secondary | ICD-10-CM | POA: Diagnosis not present

## 2020-10-18 DIAGNOSIS — R110 Nausea: Secondary | ICD-10-CM | POA: Diagnosis not present

## 2020-10-29 ENCOUNTER — Other Ambulatory Visit: Payer: Self-pay

## 2020-10-29 ENCOUNTER — Ambulatory Visit: Payer: BC Managed Care – PPO | Admitting: Nurse Practitioner

## 2020-11-05 ENCOUNTER — Ambulatory Visit (INDEPENDENT_AMBULATORY_CARE_PROVIDER_SITE_OTHER): Payer: BC Managed Care – PPO | Admitting: Family Medicine

## 2020-11-05 ENCOUNTER — Encounter: Payer: Self-pay | Admitting: Family Medicine

## 2020-11-05 DIAGNOSIS — N200 Calculus of kidney: Secondary | ICD-10-CM | POA: Diagnosis not present

## 2020-11-05 NOTE — Progress Notes (Signed)
   Virtual Visit via Telephone Note  I connected with Kristen Mejia on 11/05/20 at 2:26 AM by telephone and verified that I am speaking with the correct person using two identifiers. Kristen Mejia is currently located at home and nobody is currently with her during this visit. The provider, Gwenlyn Fudge, FNP is located in their home at time of visit.  I discussed the limitations, risks, security and privacy concerns of performing an evaluation and management service by telephone and the availability of in person appointments. I also discussed with the patient that there may be a patient responsible charge related to this service. The patient expressed understanding and agreed to proceed.  Subjective: PCP: Junie Spencer, FNP  Chief Complaint  Patient presents with  . Flank Pain   Patient reports her right side started hurting last night and she threw up this morning.  She reports this is the same way she felt when she was diagnosed with a kidney stone previously.  She has no more of the Flomax.  ROS: Per HPI  Current Outpatient Medications:  .  ondansetron (ZOFRAN) 4 MG tablet, Take 1 tablet (4 mg total) by mouth every 8 (eight) hours as needed for nausea or vomiting., Disp: 20 tablet, Rfl: 0 .  tamsulosin (FLOMAX) 0.4 MG CAPS capsule, Take 1 capsule (0.4 mg total) by mouth daily., Disp: 30 capsule, Rfl: 3  No Known Allergies History reviewed. No pertinent past medical history.  Observations/Objective: A&O  No respiratory distress or wheezing audible over the phone Mood, judgement, and thought processes all WNL   Assessment and Plan: 1. Kidney stone Patient has refills of Flomax at the pharmacy that she was unaware of.  Education sent to her via MyChart on dietary guidelines to help prevent kidney stones.   Follow Up Instructions:  I discussed the assessment and treatment plan with the patient. The patient was provided an opportunity to ask questions and all were answered.  The patient agreed with the plan and demonstrated an understanding of the instructions.   The patient was advised to call back or seek an in-person evaluation if the symptoms worsen or if the condition fails to improve as anticipated.  The above assessment and management plan was discussed with the patient. The patient verbalized understanding of and has agreed to the management plan. Patient is aware to call the clinic if symptoms persist or worsen. Patient is aware when to return to the clinic for a follow-up visit. Patient educated on when it is appropriate to go to the emergency department.   Time call ended: 2:33 PM  I provided 7 minutes of non-face-to-face time during this encounter.  Deliah Boston, MSN, APRN, FNP-C [facility] 11/05/20

## 2020-11-05 NOTE — Patient Instructions (Signed)
Dietary Guidelines to Help Prevent Kidney Stones Kidney stones are deposits of minerals and salts that form inside your kidneys. Your risk of developing kidney stones may be greater depending on your diet, your lifestyle, the medicines you take, and whether you have certain medical conditions. Most people can lower their chances of developing kidney stones by following the instructions below. Your dietitian may give you more specific instructions depending on your overall health and the type of kidney stones you tend to develop. What are tips for following this plan? Reading food labels  Choose foods with "no salt added" or "low-salt" labels. Limit your salt (sodium) intake to less than 1,500 mg a day.  Choose foods with calcium for each meal and snack. Try to eat about 300 mg of calcium at each meal. Foods that contain 200-500 mg of calcium a serving include: ? 8 oz (237 mL) of milk, calcium-fortifiednon-dairy milk, and calcium-fortifiedfruit juice. Calcium-fortified means that calcium has been added to these drinks. ? 8 oz (237 mL) of kefir, yogurt, and soy yogurt. ? 4 oz (114 g) of tofu. ? 1 oz (28 g) of cheese. ? 1 cup (150 g) of dried figs. ? 1 cup (91 g) of cooked broccoli. ? One 3 oz (85 g) can of sardines or mackerel. Most people need 1,000-1,500 mg of calcium a day. Talk to your dietitian about how much calcium is recommended for you.   Shopping  Buy plenty of fresh fruits and vegetables. Most people do not need to avoid fruits and vegetables, even if these foods contain nutrients that may contribute to kidney stones.  When shopping for convenience foods, choose: ? Whole pieces of fruit. ? Pre-made salads with dressing on the side. ? Low-fat fruit and yogurt smoothies.  Avoid buying frozen meals or prepared deli foods. These can be high in sodium.  Look for foods with live cultures, such as yogurt and kefir.  Choose high-fiber grains, such as whole-wheat breads, oat bran, and  wheat cereals. Cooking  Do not add salt to food when cooking. Place a salt shaker on the table and allow each person to add his or her own salt to taste.  Use vegetable protein, such as beans, textured vegetable protein (TVP), or tofu, instead of meat in pasta, casseroles, and soups. Meal planning  Eat less salt, if told by your dietitian. To do this: ? Avoid eating processed or pre-made food. ? Avoid eating fast food.  Eat less animal protein, including cheese, meat, poultry, or fish, if told by your dietitian. To do this: ? Limit the number of times you have meat, poultry, fish, or cheese each week. Eat a diet free of meat at least 2 days a week. ? Eat only one serving each day of meat, poultry, fish, or seafood. ? When you prepare animal protein, cut pieces into small portion sizes. For most meat and fish, one serving is about the size of the palm of your hand.  Eat at least five servings of fresh fruits and vegetables each day. To do this: ? Keep fruits and vegetables on hand for snacks. ? Eat one piece of fruit or a handful of berries with breakfast. ? Have a salad and fruit at lunch. ? Have two kinds of vegetables at dinner.  Limit foods that are high in a substance called oxalate. These include: ? Spinach (cooked), rhubarb, beets, sweet potatoes, and Swiss chard. ? Peanuts. ? Potato chips, french fries, and baked potatoes with skin on. ? Nuts and   nut products. ? Chocolate.  If you regularly take a diuretic medicine, make sure to eat at least 1 or 2 servings of fruits or vegetables that are high in potassium each day. These include: ? Avocado. ? Banana. ? Orange, prune, carrot, or tomato juice. ? Baked potato. ? Cabbage. ? Beans and split peas. Lifestyle  Drink enough fluid to keep your urine pale yellow. This is the most important thing you can do. Spread your fluid intake throughout the day.  If you drink alcohol: ? Limit how much you use to:  0-1 drink a day for  women who are not pregnant.  0-2 drinks a day for men. ? Be aware of how much alcohol is in your drink. In the [place] one drink equals one 12 oz bottle of beer (355 mL), one 5 oz glass of wine (148 mL), or one 1 oz glass of hard liquor (44 mL).  Lose weight if told by your health care provider. Work with your dietitian to find an eating plan and weight loss strategies that work best for you.   General information  Talk to your health care provider and dietitian about taking daily supplements. You may be told the following depending on your health and the cause of your kidney stones: ? Not to take supplements with vitamin C. ? To take a calcium supplement. ? To take a daily probiotic supplement. ? To take other supplements such as magnesium, fish oil, or vitamin B6.  Take over-the-counter and prescription medicines only as told by your health care provider. These include supplements. What foods should I limit? Limit your intake of the following foods, or eat them as told by your dietitian. Vegetables Spinach. Rhubarb. Beets. Canned vegetables. Pickles. Olives. Baked potatoes with skin. Grains Wheat bran. Baked goods. Salted crackers. Cereals high in sugar. Meats and other proteins Nuts. Nut butters. Large portions of meat, poultry, or fish. Salted, precooked, or cured meats, such as sausages, meat loaves, and hot dogs. Dairy Cheese. Beverages Regular soft drinks. Regular vegetable juice. Seasonings and condiments Seasoning blends with salt. Salad dressings. Soy sauce. Ketchup. Barbecue sauce. Other foods Canned soups. Canned pasta sauce. Casseroles. Pizza. Lasagna. Frozen meals. Potato chips. French fries. The items listed above may not be a complete list of foods and beverages you should limit. Contact a dietitian for more information. What foods should I avoid? Talk to your dietitian about specific foods you should avoid based on the type of kidney stones you have and your overall  health. Fruits Grapefruit. The item listed above may not be a complete list of foods and beverages you should avoid. Contact a dietitian for more information. Summary  Kidney stones are deposits of minerals and salts that form inside your kidneys.  You can lower your risk of kidney stones by making changes to your diet.  The most important thing you can do is drink enough fluid. Drink enough fluid to keep your urine pale yellow.  Talk to your dietitian about how much calcium you should have each day, and eat less salt and animal protein as told by your dietitian. This information is not intended to replace advice given to you by your health care provider. Make sure you discuss any questions you have with your health care provider. Document Revised: 07/19/2019 Document Reviewed: 07/19/2019 Elsevier Patient Education  2021 [employer] Inc[employer]  

## 2020-11-06 ENCOUNTER — Encounter: Payer: Self-pay | Admitting: Family Medicine

## 2020-11-06 ENCOUNTER — Telehealth: Payer: Self-pay

## 2020-11-06 NOTE — Telephone Encounter (Signed)
Letter up front - pt aware 

## 2020-11-06 NOTE — Telephone Encounter (Signed)
That is fine 

## 2020-11-06 NOTE — Telephone Encounter (Signed)
Patient had visit with Alona Bene- please advise

## 2020-11-26 ENCOUNTER — Ambulatory Visit (INDEPENDENT_AMBULATORY_CARE_PROVIDER_SITE_OTHER): Payer: BC Managed Care – PPO | Admitting: Family Medicine

## 2020-11-26 ENCOUNTER — Encounter: Payer: Self-pay | Admitting: Family Medicine

## 2020-11-26 DIAGNOSIS — J0100 Acute maxillary sinusitis, unspecified: Secondary | ICD-10-CM

## 2020-11-26 DIAGNOSIS — J301 Allergic rhinitis due to pollen: Secondary | ICD-10-CM

## 2020-11-26 MED ORDER — PREDNISONE 10 MG (21) PO TBPK: ORAL_TABLET | ORAL | 0 refills | Status: DC

## 2020-11-26 MED ORDER — FEXOFENADINE-PSEUDOEPHED ER 180-240 MG PO TB24: 1.0000 | ORAL_TABLET | Freq: Every evening | ORAL | 11 refills | Status: DC

## 2020-11-26 MED ORDER — AMOXICILLIN-POT CLAVULANATE 875-125 MG PO TABS: 1.0000 | ORAL_TABLET | Freq: Two times a day (BID) | ORAL | 0 refills | Status: DC

## 2020-11-26 NOTE — Progress Notes (Signed)
Subjective:    Patient ID: Kristen Mejia, female    DOB: [birth year], 20 y.o.   MRN: [id number]   HPI: Kristen Mejia is a 20 y.o. female presenting for 2 weeks of allergy problems. Using various OTC preps with no relief. So congested that she can't hear from right ear. Minimal pain. Eyes itching, watering. Blowing nose, cough. Dry cough. Clear mucoid nasal drainage Having post nasal drainage. Denies facial pressure. Having HA. No fever.   Depression screen [facility] 2/9 06/24/2020 02/18/2020 06/15/2019 01/03/2019 09/04/2018  Decreased Interest 2 0 3 0 0  Down, Depressed, Hopeless 2 0 2 0 0  PHQ - 2 Score 4 0 5 0 0  Altered sleeping 3 - 3 0 0  Tired, decreased energy 3 - 3 0 0  Change in appetite 2 - 1 0 0  Feeling bad or failure about yourself  1 - 3 0 0  Trouble concentrating 2 - 3 0 0  Moving slowly or fidgety/restless 2 - 2 0 0  Suicidal thoughts 0 - 2 0 0  PHQ-9 Score 17 - 22 0 0  Difficult doing work/chores Very difficult - Very difficult - -  Some recent data might be hidden     Relevant past medical, surgical, family and social history reviewed and updated as indicated.  Interim medical history since our last visit reviewed. Allergies and medications reviewed and updated.  ROS:  Review of Systems  Constitutional: Negative for chills, diaphoresis and fever.  HENT: Positive for congestion, ear pain, hearing loss (on right), postnasal drip and rhinorrhea. Negative for sore throat.   Respiratory: Positive for cough. Negative for chest tightness and shortness of breath.   Cardiovascular: Negative for chest pain.  Skin: Negative for rash.     Social History   Tobacco Use  Smoking Status Passive Smoke Exposure - Never Smoker  Smokeless Tobacco Never Used       Objective:     Wt Readings from Last 3 Encounters:  09/10/20 128 lb 6 oz (58.2 kg)  06/24/20 128 lb 3.2 oz (58.2 kg) (50 %, Z= 0.01)*  02/18/20 130 lb 4 oz (59.1 kg) (55 %, Z= 0.13)*   * Growth percentiles are  based on CDC (Girls, 2-20 Years) data.     Exam deferred. Pt. Harboring due to COVID 19. Phone visit performed.   Assessment & Plan:   1. Seasonal allergic rhinitis due to pollen   2. Acute maxillary sinusitis, recurrence not specified     Meds ordered this encounter  Medications  . fexofenadine-pseudoephedrine (ALLEGRA-D 24) 180-240 MG 24 hr tablet    Sig: Take 1 tablet by mouth every evening. For allergy and congestion    Dispense:  30 tablet    Refill:  11  . predniSONE (STERAPRED UNI-PAK 21 TAB) 10 MG (21) TBPK tablet    Sig: Use as package directs    Dispense:  21 tablet    Refill:  0  . amoxicillin-clavulanate (AUGMENTIN) 875-125 MG tablet    Sig: Take 1 tablet by mouth 2 (two) times daily. Take all of this medication    Dispense:  20 tablet    Refill:  0    No orders of the defined types were placed in this encounter.     Diagnoses and all orders for this visit:  Seasonal allergic rhinitis due to pollen -     fexofenadine-pseudoephedrine (ALLEGRA-D 24) 180-240 MG 24 hr tablet; Take 1 tablet by mouth every evening. For allergy and congestion -  predniSONE (STERAPRED UNI-PAK 21 TAB) 10 MG (21) TBPK tablet; Use as package directs  Acute maxillary sinusitis, recurrence not specified -     amoxicillin-clavulanate (AUGMENTIN) 875-125 MG tablet; Take 1 tablet by mouth 2 (two) times daily. Take all of this medication    Virtual Visit via telephone Note  I discussed the limitations, risks, security and privacy concerns of performing an evaluation and management service by telephone and the availability of in person appointments. The patient was identified with two identifiers. Pt.expressed understanding and agreed to proceed. Pt. Is at home. Dr. Darlyn Read is in his office.  Follow Up Instructions:   I discussed the assessment and treatment plan with the patient. The patient was provided an opportunity to ask questions and all were answered. The patient agreed with the  plan and demonstrated an understanding of the instructions.   The patient was advised to call back or seek an in-person evaluation if the symptoms worsen or if the condition fails to improve as anticipated.   Total minutes including chart review and phone contact time: 13   Follow up plan: Return if symptoms worsen or fail to improve.  Mechele Claude, MD [facility]

## 2020-12-17 ENCOUNTER — Encounter: Payer: Self-pay | Admitting: Family Medicine

## 2020-12-17 ENCOUNTER — Ambulatory Visit (INDEPENDENT_AMBULATORY_CARE_PROVIDER_SITE_OTHER): Payer: BC Managed Care – PPO | Admitting: Family Medicine

## 2020-12-17 DIAGNOSIS — R110 Nausea: Secondary | ICD-10-CM | POA: Diagnosis not present

## 2020-12-17 DIAGNOSIS — R109 Unspecified abdominal pain: Secondary | ICD-10-CM

## 2020-12-17 MED ORDER — ONDANSETRON HCL 4 MG PO TABS: 4.0000 mg | ORAL_TABLET | Freq: Three times a day (TID) | ORAL | 0 refills | Status: DC | PRN

## 2020-12-17 NOTE — Progress Notes (Signed)
   Virtual Visit  Note Due to COVID-19 pandemic this visit was conducted virtually. This visit type was conducted due to national recommendations for restrictions regarding the COVID-19 Pandemic (e.g. social distancing, sheltering in place) in an effort to limit this patient's exposure and mitigate transmission in our community. All issues noted in this document were discussed and addressed.  A physical exam was not performed with this format.  I connected with Kristen Mejia on 12/17/20 at 0846 by telephone and verified that I am speaking with the correct person using two identifiers. Kristen Mejia is currently located at home and no one is currently with her during the visit. The provider, Gabriel Earing, FNP is located in their office at time of visit.  I discussed the limitations, risks, security and privacy concerns of performing an evaluation and management service by telephone and the availability of in person appointments. I also discussed with the patient that there may be a patient responsible charge related to this service. The patient expressed understanding and agreed to proceed.  CC: abdominal cramping  History and Present Illness:  HPI  Kristen Mejia reports abdominal cramping since this morning. She reports that the cramping is all over her abdominal but is worse in her lower abdomen. The pain is moderate. She also reports nausea that started last night. She reports that it is about 2-3 days before her cycle. She denies possibility of pregnancy. Denies fever, vomiting, diarrhea, HA, congestion, cough, shortness of breath, dysuria, frequency, or urgency. She has taken ibuprofen with a little relief.   ROS As per HPI.   Observations/Objective: Alert and oriented x 3. Able to speak in full sentences without difficulty.   Assessment and Plan: Kristen Mejia was seen today for abdominal cramping.  Diagnoses and all orders for this visit:  Abdominal cramping/Nausea Unsure of etiology.  Discussed possibility of stomach virus. Zofran for nausea. Pepto bismol and ibuprofen for cramping. Discussed imodium if diarrhea occurs. Return to office for new or worsening symptoms, or if symptoms persist.  -     ondansetron (ZOFRAN) 4 MG tablet; Take 1 tablet (4 mg total) by mouth every 8 (eight) hours as needed for nausea or vomiting.   Follow Up Instructions: Return for in person evaluation if symptoms worsen or persist.     I discussed the assessment and treatment plan with the patient. The patient was provided an opportunity to ask questions and all were answered. The patient agreed with the plan and demonstrated an understanding of the I nstructions.   The patient was advised to call back or seek an in-person evaluation if the symptoms worsen or if the condition fails to improve as anticipated.  The above assessment and management plan was discussed with the patient. The patient verbalized understanding of and has agreed to the management plan. Patient is aware to call the clinic if symptoms persist or worsen. Patient is aware when to return to the clinic for a follow-up visit. Patient educated on when it is appropriate to go to the emergency department.   Time call ended:  0848  I provided 12 minutes of  non face-to-face time during this encounter.    Gabriel Earing, FNP

## 2020-12-23 ENCOUNTER — Ambulatory Visit: Payer: BC Managed Care – PPO | Admitting: Family

## 2020-12-25 ENCOUNTER — Encounter: Payer: Self-pay | Admitting: Family Medicine

## 2020-12-25 ENCOUNTER — Ambulatory Visit (INDEPENDENT_AMBULATORY_CARE_PROVIDER_SITE_OTHER): Payer: BC Managed Care – PPO | Admitting: Family Medicine

## 2020-12-25 ENCOUNTER — Other Ambulatory Visit: Payer: Self-pay

## 2020-12-25 VITALS — BP 113/79 | HR 84 | Temp 97.9°F | Ht 62.0 in | Wt 134.0 lb

## 2020-12-25 DIAGNOSIS — N898 Other specified noninflammatory disorders of vagina: Secondary | ICD-10-CM

## 2020-12-25 DIAGNOSIS — Z3A01 Less than 8 weeks gestation of pregnancy: Secondary | ICD-10-CM

## 2020-12-25 LAB — URINALYSIS, ROUTINE W REFLEX MICROSCOPIC
Bilirubin, UA: NEGATIVE
Glucose, UA: NEGATIVE
Ketones, UA: NEGATIVE
Leukocytes,UA: NEGATIVE
Nitrite, UA: NEGATIVE
Protein,UA: NEGATIVE
RBC, UA: NEGATIVE
Specific Gravity, UA: >1.03 — ABNORMAL HIGH (ref 1.005–1.030)
Urobilinogen, Ur: 0.2 mg/dL (ref 0.2–1.0)
pH, UA: 5.5 (ref 5.0–7.5)

## 2020-12-25 LAB — WET PREP FOR TRICH, YEAST, CLUE
Clue Cell Exam: NEGATIVE
Trichomonas Exam: NEGATIVE
Yeast Exam: NEGATIVE

## 2020-12-25 NOTE — Progress Notes (Signed)
   Assessment & Plan:  1. Less than [redacted] weeks gestation of pregnancy Advised to start prenatal vitamin and schedule an appointment with OB.   2. Vagina itching Symptom resolved. Wet prep and UA negative.  - WET PREP FOR TRICH, YEAST, CLUE - Urinalysis, Routine w reflex microscopic   Follow up plan: Return if symptoms worsen or fail to improve.  Deliah Boston, MSN, APRN, FNP-C [facility]  Subjective:   Patient ID: Kristen Mejia, female    DOB: 08-26-2000, 20 y.o.   MRN: [id number]  HPI: Kristen Mejia is a 20 y.o. female presenting on 12/25/2020 for Vaginal Itching (X 1 day/) and Urinary Frequency (X 2 days)  Patient reports she was having some vaginal itching yesterday when she got off work. States she does a lot of walking and was hot and sweaty at work. Better this morning.  Also reports urinary frequency x2 days.  After discussing the above and test results, patient reports she has had 4 positive pregnancy tests. Last period the middle of April.    ROS: Negative unless specifically indicated above in HPI.   Relevant past medical history reviewed and updated as indicated.   Allergies and medications reviewed and updated.   Current Outpatient Medications:  .  fexofenadine-pseudoephedrine (ALLEGRA-D 24) 180-240 MG 24 hr tablet, Take 1 tablet by mouth every evening. For allergy and congestion, Disp: 30 tablet, Rfl: 11  No Known Allergies  Objective:   BP 113/79   Pulse 84   Temp 97.9 F (36.6 C) (Temporal)   Ht 5\' 2"  (1.575 m)   Wt 134 lb (60.8 kg)   LMP 12/11/2020 (Approximate)   SpO2 98%   BMI 24.51 kg/m    Physical Exam Vitals reviewed.  Constitutional:      General: She is not in acute distress.    Appearance: Normal appearance. She is not ill-appearing, toxic-appearing or diaphoretic.  HENT:     Head: Normocephalic and atraumatic.  Eyes:     General: No scleral icterus.       Right eye: No discharge.        Left eye: No discharge.      Conjunctiva/sclera: Conjunctivae normal.  Cardiovascular:     Rate and Rhythm: Normal rate.  Pulmonary:     Effort: Pulmonary effort is normal. No respiratory distress.  Musculoskeletal:        General: Normal range of motion.     Cervical back: Normal range of motion.  Skin:    General: Skin is warm and dry.     Capillary Refill: Capillary refill takes less than 2 seconds.  Neurological:     General: No focal deficit present.     Mental Status: She is alert and oriented to person, place, and time. Mental status is at baseline.  Psychiatric:        Mood and Affect: Mood normal.        Behavior: Behavior normal.        Thought Content: Thought content normal.        Judgment: Judgment normal.

## 2020-12-26 ENCOUNTER — Encounter: Payer: Self-pay | Admitting: Family

## 2021-01-08 ENCOUNTER — Encounter: Payer: Self-pay | Admitting: Family

## 2021-01-08 ENCOUNTER — Ambulatory Visit (INDEPENDENT_AMBULATORY_CARE_PROVIDER_SITE_OTHER): Payer: BC Managed Care – PPO | Admitting: Family

## 2021-01-08 DIAGNOSIS — Z3A01 Less than 8 weeks gestation of pregnancy: Secondary | ICD-10-CM

## 2021-01-08 DIAGNOSIS — O219 Vomiting of pregnancy, unspecified: Secondary | ICD-10-CM

## 2021-01-08 DIAGNOSIS — R112 Nausea with vomiting, unspecified: Secondary | ICD-10-CM

## 2021-01-08 MED ORDER — DOXYLAMINE-PYRIDOXINE 10-10 MG PO TBEC
2.0000 | DELAYED_RELEASE_TABLET | Freq: Every day | ORAL | 1 refills | Status: DC
Start: 2021-01-08 — End: 2021-03-17

## 2021-01-08 NOTE — Progress Notes (Signed)
   Virtual Visit  Note Due to COVID-19 pandemic this visit was conducted virtually. This visit type was conducted due to national recommendations for restrictions regarding the COVID-19 Pandemic (e.g. social distancing, sheltering in place) in an effort to limit this patient's exposure and mitigate transmission in our community. All issues noted in this document were discussed and addressed.  A physical exam was not performed with this format.  I connected with Kristen Mejia on 01/08/21 at 1:20 pm  by telephone and verified that I am speaking with the correct person using two identifiers. Kristen Mejia is currently located at home and no one is currently with her during visit. The provider, [name], FNP is located in their office at time of visit.  I discussed the limitations, risks, security and privacy concerns of performing an evaluation and management service by telephone and the availability of in person appointments. I also discussed with the patient that there may be a patient responsible charge related to this service. The patient expressed understanding and agreed to proceed.   History and Present Illness:  HPI Pt calls the office today with nausea. She reports she is pregnant and around 7-8 weeks. She has a GYN visit scheduled for Monday.   However, she reports she has not been able to eat and only keep crackers down. She reports she threw up at work twice yesterday and having fatigue.   She has taken dramamine with mild relief.   Review of Systems  Constitutional: Positive for malaise/fatigue.  Gastrointestinal: Positive for nausea and vomiting.  All other systems reviewed and are negative.    Observations/Objective: No SOB or distress noted   Assessment and Plan: 1. Nausea and vomiting during pregnancy  - Doxylamine-Pyridoxine 10-10 MG TBEC; Take 2 tablets by mouth at bedtime.  Dispense: 60 tablet; Refill: 1  2. Less than [redacted] weeks gestation of pregnancy  Force  fluids  Bland diet Keep GYN appt Prenatal vitamin      I discussed the assessment and treatment plan with the patient. The patient was provided an opportunity to ask questions and all were answered. The patient agreed with the plan and demonstrated an understanding of the instructions.   The patient was advised to call back or seek an in-person evaluation if the symptoms worsen or if the condition fails to improve as anticipated.  The above assessment and management plan was discussed with the patient. The patient verbalized understanding of and has agreed to the management plan. Patient is aware to call the clinic if symptoms persist or worsen. Patient is aware when to return to the clinic for a follow-up visit. Patient educated on when it is appropriate to go to the emergency department.   Time call ended:  1:31 pm     I provided 11 minutes of  non face-to-face time during this encounter.    [name], FNP

## 2021-01-09 ENCOUNTER — Telehealth: Payer: Self-pay | Admitting: Family

## 2021-01-09 NOTE — Telephone Encounter (Signed)
Patient aware note up front to be picked up.  

## 2021-01-09 NOTE — Telephone Encounter (Signed)
Pcp off okay for extension

## 2021-01-14 NOTE — Telephone Encounter (Signed)
Yes that is fine to go ahead and extend the note for her

## 2021-01-14 NOTE — Telephone Encounter (Signed)
PCP off today  Will send to covering provider  Please advise

## 2021-01-14 NOTE — Telephone Encounter (Signed)
Letter done and patient aware

## 2021-01-14 NOTE — Telephone Encounter (Signed)
Pt not feeling well today and wants to know if Kristen Mejia will extended work note. Pt wants to extend work note to go back tomorrow 01/15/2021. Please call back when ready to pick up.

## 2021-01-15 ENCOUNTER — Telehealth: Payer: Self-pay | Admitting: Family

## 2021-01-15 ENCOUNTER — Encounter: Payer: Self-pay | Admitting: Family

## 2021-01-15 NOTE — Telephone Encounter (Signed)
Letter written and placed up front patient aware

## 2021-01-15 NOTE — Telephone Encounter (Signed)
Ok to extend

## 2021-01-15 NOTE — Telephone Encounter (Signed)
LOV 01/08/21 please advise if okay to extend note

## 2021-01-15 NOTE — Telephone Encounter (Signed)
Note was extended yesterday to return to work today Please advise

## 2021-02-16 ENCOUNTER — Ambulatory Visit (INDEPENDENT_AMBULATORY_CARE_PROVIDER_SITE_OTHER): Payer: BC Managed Care – PPO | Admitting: Nurse Practitioner

## 2021-02-16 ENCOUNTER — Encounter: Payer: Self-pay | Admitting: Nurse Practitioner

## 2021-02-16 DIAGNOSIS — R399 Unspecified symptoms and signs involving the genitourinary system: Secondary | ICD-10-CM

## 2021-02-16 MED ORDER — NITROFURANTOIN MONOHYD MACRO 100 MG PO CAPS: 100.0000 mg | ORAL_CAPSULE | Freq: Two times a day (BID) | ORAL | 0 refills | Status: DC

## 2021-02-16 NOTE — Progress Notes (Signed)
   Virtual Visit  Note Due to COVID-19 pandemic this visit was conducted virtually. This visit type was conducted due to national recommendations for restrictions regarding the COVID-19 Pandemic (e.g. social distancing, sheltering in place) in an effort to limit this patient's exposure and mitigate transmission in our community. All issues noted in this document were discussed and addressed.  A physical exam was not performed with this format.  I connected with Kristen Mejia on 02/16/21 at 1:38 by telephone and verified that I am speaking with the correct person using two identifiers. Kristen Mejia is currently located at  work and none is currently with her during visit. The provider, Mary-Margaret Daphine Deutscher, FNP is located in their office at time of visit.  I discussed the limitations, risks, security and privacy concerns of performing an evaluation and management service by telephone and the availability of in person appointments. I also discussed with the patient that there may be a patient responsible charge related to this service. The patient expressed understanding and agreed to proceed.   History and Present Illness:   Chief Complaint: UTI  HPI Patient calls in stating that she has been taking AZO for 3 days . She has constant urge to void. She is only peeing a scant amount.    Review of Systems  Constitutional:  Negative for chills and fever.  Respiratory: Negative.    Cardiovascular: Negative.   Genitourinary:  Positive for frequency and urgency. Negative for dysuria, flank pain and hematuria.  Musculoskeletal:  Negative for back pain.    Observations/Objective: Alert and oriented- answers all questions appropriately No distress    Assessment and Plan: Kristen Mejia in today with chief complaint of Urinary Tract Infection   1. UTI symptoms Take medication as prescribe Cotton underwear Take shower not bath Cranberry juice, yogurt Force fluids AZO over the counter X2  days RTO prn  - nitrofurantoin, macrocrystal-monohydrate, (MACROBID) 100 MG capsule; Take 1 capsule (100 mg total) by mouth 2 (two) times daily. 1 po BId  Dispense: 14 capsule; Refill: 0    Follow Up Instructions: prn    I discussed the assessment and treatment plan with the patient. The patient was provided an opportunity to ask questions and all were answered. The patient agreed with the plan and demonstrated an understanding of the instructions.   The patient was advised to call back or seek an in-person evaluation if the symptoms worsen or if the condition fails to improve as anticipated.  The above assessment and management plan was discussed with the patient. The patient verbalized understanding of and has agreed to the management plan. Patient is aware to call the clinic if symptoms persist or worsen. Patient is aware when to return to the clinic for a follow-up visit. Patient educated on when it is appropriate to go to the emergency department.   Time call ended:  1:50  I provided 12 minutes of  non face-to-face time during this encounter.    Mary-Margaret Daphine Deutscher, FNP

## 2021-03-17 ENCOUNTER — Telehealth: Payer: Self-pay | Admitting: Family

## 2021-03-17 ENCOUNTER — Ambulatory Visit (INDEPENDENT_AMBULATORY_CARE_PROVIDER_SITE_OTHER): Payer: BC Managed Care – PPO | Admitting: Family Medicine

## 2021-03-17 ENCOUNTER — Other Ambulatory Visit: Payer: Self-pay

## 2021-03-17 ENCOUNTER — Encounter: Payer: Self-pay | Admitting: Family Medicine

## 2021-03-17 VITALS — BP 117/78 | HR 75 | Temp 97.6°F | Resp 20 | Ht 62.0 in | Wt 138.0 lb

## 2021-03-17 DIAGNOSIS — L249 Irritant contact dermatitis, unspecified cause: Secondary | ICD-10-CM

## 2021-03-17 MED ORDER — METHYLPREDNISOLONE ACETATE 40 MG/ML IJ SUSP: 60.0000 mg | Freq: Once | INTRAMUSCULAR | Status: DC

## 2021-03-17 NOTE — Telephone Encounter (Signed)
If the person had something on them, such as lotion, that she is allergic to, it could have caused the reaction.        Per Kari Baars, FNP  Patient aware.

## 2021-03-17 NOTE — Progress Notes (Signed)
Subjective:     Kristen Mejia is a 20 y.o. female who presents for evaluation of a rash involving the face. Rash started  several   minutes  ago. Lesions are pink and red, and raised in texture. Rash has not changed over time. Rash is pruritic. Associated symptoms: none. Patient denies: abdominal pain, arthralgia, congestion, cough, crankiness, decrease in appetite, decrease in energy level, fever, headache, irritability, myalgia, nausea, sore throat, and vomiting. Patient has not had contacts with similar rash. Patient has not had new exposures (soaps, lotions, laundry detergents, foods, medications, plants, insects or animals).  The following portions of the patient's history were reviewed and updated as appropriate: allergies, current medications, past family history, past medical history, past social history, past surgical history, and problem list.  Review of Systems A comprehensive review of systems was negative except for: Integument/breast: positive for pruritus, rash, and skin color change    Objective:    BP 117/78   Pulse 75   Temp 97.6 F (36.4 C)   Resp 20   Ht 5\' 2"  (1.575 m)   Wt 138 lb (62.6 kg)   LMP 02/17/2021 (Approximate)   SpO2 99%   BMI 25.24 kg/m  Physical Exam Vitals and nursing note reviewed.  Constitutional:      General: She is not in acute distress.    Appearance: Normal appearance. She is well-developed and well-groomed. She is not ill-appearing, toxic-appearing or diaphoretic.  HENT:     Head: Normocephalic and atraumatic.     Jaw: There is normal jaw occlusion.     Right Ear: Hearing normal.     Left Ear: Hearing normal.     Nose: Nose normal. No congestion or rhinorrhea.     Mouth/Throat:     Lips: Pink.     Mouth: Mucous membranes are moist.     Pharynx: Oropharynx is clear. Uvula midline.  Eyes:     General: Lids are normal.     Extraocular Movements: Extraocular movements intact.     Conjunctiva/sclera: Conjunctivae normal.     Pupils:  Pupils are equal, round, and reactive to light.  Neck:     Thyroid: No thyroid mass, thyromegaly or thyroid tenderness.     Vascular: No carotid bruit or JVD.     Trachea: Trachea and phonation normal.  Cardiovascular:     Rate and Rhythm: Normal rate and regular rhythm.     Chest Wall: PMI is not displaced.     Pulses: Normal pulses.     Heart sounds: Normal heart sounds. No murmur heard.   No friction rub. No gallop.  Pulmonary:     Effort: Pulmonary effort is normal. No respiratory distress.     Breath sounds: Normal breath sounds. No stridor. No wheezing, rhonchi or rales.  Chest:     Chest wall: No tenderness.  Abdominal:     General: Bowel sounds are normal. There is no distension or abdominal bruit.     Palpations: Abdomen is soft. There is no hepatomegaly or splenomegaly.     Tenderness: There is no abdominal tenderness. There is no right CVA tenderness or left CVA tenderness.     Hernia: No hernia is present.  Musculoskeletal:        General: Normal range of motion.     Cervical back: Normal range of motion and neck supple.     Right lower leg: No edema.     Left lower leg: No edema.  Lymphadenopathy:     Cervical:  No cervical adenopathy.  Skin:    General: Skin is warm and dry.     Capillary Refill: Capillary refill takes less than 2 seconds.     Coloration: Skin is not cyanotic, jaundiced or pale.     Findings: Erythema and rash present. Rash is papular.       Neurological:     General: No focal deficit present.     Mental Status: She is alert and oriented to person, place, and time.     Cranial Nerves: Cranial nerves are intact.     Sensory: Sensation is intact.     Motor: Motor function is intact.     Coordination: Coordination is intact.     Gait: Gait is intact.     Deep Tendon Reflexes: Reflexes are normal and symmetric.  Psychiatric:        Attention and Perception: Attention and perception normal.        Mood and Affect: Mood and affect normal.         Speech: Speech normal.        Behavior: Behavior normal. Behavior is cooperative.        Thought Content: Thought content normal.        Cognition and Memory: Cognition and memory normal.        Judgment: Judgment normal.    [name] was seen today for rash.  Diagnoses and all orders for this visit:  Irritant contact dermatitis, unspecified trigger Unknown cause of reaction. No s/s of anaphylaxis present. Local to forehead. Symptomatic care discussed in detail. To use Aveeno eczema daily, claritin or allegra nightly, pepcid 20 mg nightly for 14 days. Pt aware to report any new or worsening symptoms. Will burst with steroids in office today.   -     methylPREDNISolone acetate (DEPO-MEDROL) injection 60 mg   The above assessment and management plan was discussed with the patient. The patient verbalized understanding of and has agreed to the management plan. Patient is aware to call the clinic if they develop any new symptoms or if symptoms fail to improve or worsen. Patient is aware when to return to the clinic for a follow-up visit. Patient educated on when it is appropriate to go to the emergency department.  Kari Baars, FNP-C [facility] [address] [place], [place] [address] [phone]
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
## 2021-03-17 NOTE — Patient Instructions (Signed)
Place rash patient instructions here.

## 2021-05-08 ENCOUNTER — Ambulatory Visit: Payer: BC Managed Care – PPO | Admitting: Nurse Practitioner

## 2021-06-02 ENCOUNTER — Ambulatory Visit: Payer: BC Managed Care – PPO | Admitting: Family Medicine

## 2021-06-10 ENCOUNTER — Other Ambulatory Visit: Payer: Self-pay

## 2021-06-10 ENCOUNTER — Ambulatory Visit (INDEPENDENT_AMBULATORY_CARE_PROVIDER_SITE_OTHER): Payer: BC Managed Care – PPO | Admitting: Nurse Practitioner

## 2021-06-10 ENCOUNTER — Encounter: Payer: Self-pay | Admitting: Nurse Practitioner

## 2021-06-10 VITALS — BP 108/75 | HR 62 | Temp 98.0°F | Ht 62.0 in | Wt 134.8 lb

## 2021-06-10 DIAGNOSIS — N926 Irregular menstruation, unspecified: Secondary | ICD-10-CM | POA: Diagnosis not present

## 2021-06-10 DIAGNOSIS — Z23 Encounter for immunization: Secondary | ICD-10-CM | POA: Diagnosis not present

## 2021-06-10 DIAGNOSIS — R739 Hyperglycemia, unspecified: Secondary | ICD-10-CM | POA: Diagnosis not present

## 2021-06-10 MED ORDER — NORGESTIM-ETH ESTRAD TRIPHASIC 0.18/0.215/0.25 MG-25 MCG PO TABS: 1.0000 | ORAL_TABLET | Freq: Every day | ORAL | 11 refills | Status: DC

## 2021-06-10 NOTE — Progress Notes (Signed)
Acute Office Visit  Subjective:    Patient ID: Kristen Mejia, female    DOB: June 25, 2001, 20 y.o.   MRN: [id number]  Chief Complaint  Patient presents with   Menorrhagia    Always had irregular peroids hx of abortion 01/16/21.    HPI Patient is in today for Irregular Menstruation: Patient complains of irregular menses. Patient's last menstrual period was 06/10/2021. Periods are irregular, lasting 5 days. Dysmenorrhea:moderate, occurring premenstrually. Cyclic symptoms include none.  Current contraception: none. History of infertility: no. History of abnormal Pap smear: Recent abortion 01/16/2021     Past Surgical History:  Procedure Laterality Date   ANKLE RECONSTRUCTION Left     History reviewed. No pertinent family history.  Social History   Socioeconomic History   Marital status: Single    Spouse name: Not on file   Number of children: Not on file   Years of education: Not on file   Highest education level: Not on file  Occupational History   Not on file  Tobacco Use   Smoking status: Passive Smoke Exposure - Never Smoker   Smokeless tobacco: Never  Vaping Use   Vaping Use: Never used  Substance and Sexual Activity   Alcohol use: No   Drug use: No   Sexual activity: Not Currently  Other Topics Concern   Not on file  Social History Narrative   Not on file   Social Determinants of Health   Financial Resource Strain: Not on file  Food Insecurity: Not on file  Transportation Needs: Not on file  Physical Activity: Not on file  Stress: Not on file  Social Connections: Not on file  Intimate Partner Violence: Not on file    Outpatient Medications Prior to Visit  Medication Sig Dispense Refill   methylPREDNISolone acetate (DEPO-MEDROL) injection 60 mg      No facility-administered medications prior to visit.    No Known Allergies  Review of Systems  Constitutional: Negative.   HENT: Negative.    Eyes: Negative.   Respiratory: Negative.     Cardiovascular: Negative.   Gastrointestinal:  Negative for abdominal distention, abdominal pain, constipation and nausea.  Skin:  Negative for rash.  All other systems reviewed and are negative.     Objective:    Physical Exam Vitals and nursing note reviewed.  Constitutional:      Appearance: Normal appearance.  HENT:     Head: Normocephalic.     Right Ear: Ear canal and external ear normal.     Left Ear: Ear canal and external ear normal.     Nose: Nose normal.     Mouth/Throat:     Mouth: Mucous membranes are moist.     Pharynx: Oropharynx is clear.  Eyes:     Conjunctiva/sclera: Conjunctivae normal.  Cardiovascular:     Rate and Rhythm: Normal rate and regular rhythm.     Pulses: Normal pulses.     Heart sounds: Normal heart sounds.  Pulmonary:     Effort: Pulmonary effort is normal.     Breath sounds: Normal breath sounds.  Abdominal:     General: Bowel sounds are normal.  Skin:    General: Skin is warm.     Findings: No rash.  Neurological:     Mental Status: She is alert and oriented to person, place, and time.  Psychiatric:        Behavior: Behavior normal.    BP 108/75   Pulse 62   Temp 98 F (36.7 C) (  Temporal)   Ht 5\' 2"  (1.575 m)   Wt 134 lb 12.8 oz (61.1 kg)   LMP 06/10/2021   BMI 24.66 kg/m  Wt Readings from Last 3 Encounters:  06/10/21 134 lb 12.8 oz (61.1 kg)  03/17/21 138 lb (62.6 kg)  12/25/20 134 lb (60.8 kg)    Health Maintenance Due  Topic Date Due   HPV VACCINES (2 - 3-dose series) 07/18/2017       Topic Date Due   HPV VACCINES (2 - 3-dose series) 07/18/2017     Lab Results  Component Value Date   TSH 1.500 10/01/2014   Lab Results  Component Value Date   WBC 7.7 09/10/2020   HGB 12.3 09/10/2020   HCT 36.4 09/10/2020   MCV 90 09/10/2020   PLT 288 09/10/2020   Lab Results  Component Value Date   NA 141 09/10/2020   K 5.1 09/10/2020   CO2 22 09/10/2020   GLUCOSE 81 09/10/2020   BUN 10 09/10/2020   CREATININE  0.73 09/10/2020   BILITOT 0.8 09/10/2020   ALKPHOS 70 09/10/2020   AST 20 09/10/2020   ALT 15 09/10/2020   PROT 6.9 09/10/2020   ALBUMIN 4.6 09/10/2020   CALCIUM 9.5 09/10/2020      Assessment & Plan:   Problem List Items Addressed This Visit       Other   Irregular periods - Primary    Patient currently on her period, completed blood hCG, started patient on oral contraceptive to help regulate periods.  Provided education to patient printed handouts given.  Rx sent to pharmacy.  Follow-up with unresolved symptoms.      Relevant Medications   Norgestimate-Ethinyl Estradiol Triphasic (ORTHO TRI-CYCLEN LO) 0.18/0.215/0.25 MG-25 MCG tab   Other Relevant Orders   hCG, serum, qualitative   Other Visit Diagnoses     Need for immunization against influenza       Relevant Orders   Flu Vaccine QUAD 25mo+IM (Fluarix, Fluzone & Alfiuria Quad PF) (Completed)        Meds ordered this encounter  Medications   Norgestimate-Ethinyl Estradiol Triphasic (ORTHO TRI-CYCLEN LO) 0.18/0.215/0.25 MG-25 MCG tab    Sig: Take 1 tablet by mouth daily.    Dispense:  28 tablet    Refill:  11    Order Specific Question:   Supervising Provider    Answer:   09-06-1981 Mechele Claude     [[id number]], NP

## 2021-06-10 NOTE — Patient Instructions (Signed)
Abnormal Uterine Bleeding Abnormal uterine bleeding means bleeding more than usual from your womb (uterus). It can include: Bleeding between menstrual periods. Bleeding after sex. Bleeding that is heavier than normal. Menstrual periods that last longer than usual. Bleeding after you have stopped having your menstrual period (menopause). There are many problems that may cause this. You should see a doctor for any kind of bleeding that is not normal. Treatment depends on the cause of the bleeding. Follow these instructions at home: Medicines Take over-the-counter and prescription medicines only as told by your doctor. Tell your doctor about other medicines that you take. If told by your doctor, stop taking aspirin or medicines that have aspirin in them. These medicines can make you bleed more. You may be given iron pills to replace iron that your body loses because of this condition. Take them as told by your doctor. Managing constipation If you are taking iron pills, you may have trouble pooping (constipation). To prevent or treat trouble pooping, you may need to: Drink enough fluid to keep your pee (urine) pale yellow. Take over-the-counter or prescription medicines. Eat foods that are high in fiber. These include beans, whole grains, and fresh fruits and vegetables. Limit foods that are high in fat and sugar. These include fried or sweet foods. General instructions Watch your condition for any changes. Do not use tampons, douche, or have sex, if your doctor tells you not to. Change your pads often. Get regular exams. This includes pelvic exams and cervical cancer screenings. It is up to you to get the results of any tests that are done. Ask your doctor, or the department that is doing the tests, when your results will be ready. Keep all follow-up visits as told by your doctor. This is important. Contact a doctor if: The bleeding lasts more than 1 week. You feel dizzy at times. You feel  like you may vomit (nausea). You vomit. You feel light-headed or weak. Your symptoms get worse. Get help right away if: You pass out. You have to change pads every hour. You have pain in your belly. You have a fever or chills. You get sweaty. You get weak. You pass large blood clots from your vagina. Summary Abnormal uterine bleeding means bleeding more than usual from your womb (uterus). Any kind of bleeding that is not normal should be checked by a doctor. Treatment depends on the cause of the bleeding. Get help right away if you pass out, you have to change pads every hour, or you pass large blood clots from your vagina. This information is not intended to replace advice given to you by your health care provider. Make sure you discuss any questions you have with your health care provider. Document Revised: 05/29/2019 Document Reviewed: 05/29/2019 Elsevier Patient Education  2022 Elsevier Inc.  

## 2021-06-10 NOTE — Assessment & Plan Note (Signed)
Patient currently on her period, completed blood hCG, started patient on oral contraceptive to help regulate periods.  Provided education to patient printed handouts given.  Rx sent to pharmacy.  Follow-up with unresolved symptoms.

## 2021-06-11 LAB — HCG, SERUM, QUALITATIVE: hCG,Beta Subunit,Qual,Serum: NEGATIVE m[IU]/mL (ref <6)

## 2021-07-10 ENCOUNTER — Encounter: Payer: Self-pay | Admitting: Family Medicine

## 2021-07-10 ENCOUNTER — Ambulatory Visit (INDEPENDENT_AMBULATORY_CARE_PROVIDER_SITE_OTHER): Payer: BC Managed Care – PPO | Admitting: Family Medicine

## 2021-07-10 VITALS — BP 107/68 | HR 81 | Temp 98.1°F | Ht 62.0 in | Wt 136.2 lb

## 2021-07-10 DIAGNOSIS — Z113 Encounter for screening for infections with a predominantly sexual mode of transmission: Secondary | ICD-10-CM | POA: Diagnosis not present

## 2021-07-10 DIAGNOSIS — Z7251 High risk heterosexual behavior: Secondary | ICD-10-CM | POA: Diagnosis not present

## 2021-07-10 NOTE — Addendum Note (Signed)
Addended by: Gabriel Earing on: 07/10/2021 02:05 PM   Modules accepted: Orders

## 2021-07-10 NOTE — Patient Instructions (Signed)
Safe Sex Practicing safe sex means taking steps before and during sex to reduce your risk of: Getting an STI (sexually transmitted infection). Giving your partner an STI. Unwanted or unplanned pregnancy. How to practice safe sex Ways you can practice safe sex  Limit your sexual partners to only one partner who is having sex with only you. Avoid using alcohol and drugs before having sex. Alcohol and drugs can affect your judgment. Before having sex with a new partner: Talk to your partner about past partners, past STIs, and drug use. Get screened for STIs and discuss the results with your partner. Ask your partner to get screened too. Check your body regularly for sores, blisters, rashes, or unusual discharge. If you notice any of these problems, visit your health care provider. Avoid sexual contact if you have symptoms of an infection or you are being treated for an STI. While having sex, use a condom. Make sure to: Use a condom every time you have vaginal, oral, or anal sex. Both females and males should wear condoms during oral sex. Keep condoms in place from the beginning to the end of sexual activity. Use a latex condom, if possible. Latex condoms offer the best protection. Use only water-based lubricants with a condom. Using petroleum-based lubricants or oils will weaken the condom and increase the chance that it will break. Ways your health care provider can help you practice safe sex  See your health care provider for regular screenings, exams, and tests for STIs. Talk with your health care provider about what kind of birth control (contraception) is best for you. Get vaccinated against hepatitis B and human papillomavirus (HPV). If you are at risk of being infected with HIV (human immunodeficiency virus), talk with your health care provider about taking a prescription medicine to prevent HIV infection. You are at risk for HIV if you: Are a man who has sex with other men. Are  sexually active with more than one partner. Take drugs by injection. Have a sex partner who has HIV. Have unprotected sex. Have sex with someone who has sex with both men and women. Have had an STI. Follow these instructions at home: Take over-the-counter and prescription medicines only as told by your health care provider. Keep all follow-up visits. This is important. Where to find more information Centers for Disease Control and Prevention: www.cdc.gov Planned Parenthood: www.plannedparenthood.org Office on Women's Health: www.womenshealth.gov Summary Practicing safe sex means taking steps before and during sex to reduce your risk getting an STI, giving your partner an STI, and having an unwanted or unplanned pregnancy. Before having sex with a new partner, talk to your partner about past partners, past STIs, and drug use. Use a condom every time you have vaginal, oral, or anal sex. Both females and males should wear condoms during oral sex. Check your body regularly for sores, blisters, rashes, or unusual discharge. If you notice any of these problems, visit your health care provider. See your health care provider for regular screenings, exams, and tests for STIs. This information is not intended to replace advice given to you by your health care provider. Make sure you discuss any questions you have with your health care provider. Document Revised: 12/31/2019 Document Reviewed: 12/31/2019 Elsevier Patient Education  2022 Elsevier Inc.  

## 2021-07-10 NOTE — Progress Notes (Signed)
Acute Office Visit  Subjective:    Patient ID: Kristen Mejia, female    DOB: Aug 30, 2000, 20 y.o.   MRN: [id number]  Chief Complaint  Patient presents with   std screening    HPI Patient is in today for STD screening. She is sexually active with 1 female partner. She denies any know exposure or symptoms. Her LMP was 06/15/21.   No past medical history on file.  Past Surgical History:  Procedure Laterality Date   ANKLE RECONSTRUCTION Left     No family history on file.  Social History   Socioeconomic History   Marital status: Single    Spouse name: Not on file   Number of children: Not on file   Years of education: Not on file   Highest education level: Not on file  Occupational History   Not on file  Tobacco Use   Smoking status: Never    Passive exposure: Yes   Smokeless tobacco: Never  Vaping Use   Vaping Use: Never used  Substance and Sexual Activity   Alcohol use: No   Drug use: No   Sexual activity: Not Currently  Other Topics Concern   Not on file  Social History Narrative   Not on file   Social Determinants of Health   Financial Resource Strain: Not on file  Food Insecurity: Not on file  Transportation Needs: Not on file  Physical Activity: Not on file  Stress: Not on file  Social Connections: Not on file  Intimate Partner Violence: Not on file    Outpatient Medications Prior to Visit  Medication Sig Dispense Refill   Norgestimate-Ethinyl Estradiol Triphasic (ORTHO TRI-CYCLEN LO) 0.18/0.215/0.25 MG-25 MCG tab Take 1 tablet by mouth daily. 28 tablet 11   No facility-administered medications prior to visit.    No Known Allergies  Review of Systems As per HPI.     Objective:    Physical Exam Vitals and nursing note reviewed.  Constitutional:      General: She is not in acute distress.    Appearance: She is not ill-appearing, toxic-appearing or diaphoretic.  Pulmonary:     Effort: Pulmonary effort is normal. No respiratory distress.   Musculoskeletal:     Right lower leg: No edema.     Left lower leg: No edema.  Skin:    General: Skin is warm and dry.  Neurological:     General: No focal deficit present.     Mental Status: She is alert and oriented to person, place, and time.  Psychiatric:        Mood and Affect: Mood normal.        Behavior: Behavior normal.    BP 107/68   Pulse 81   Temp 98.1 F (36.7 C) (Temporal)   Ht 5\' 2"  (1.575 m)   Wt 136 lb 4 oz (61.8 kg)   LMP 06/10/2021   BMI 24.92 kg/m  Wt Readings from Last 3 Encounters:  07/10/21 136 lb 4 oz (61.8 kg)  06/10/21 134 lb 12.8 oz (61.1 kg)  03/17/21 138 lb (62.6 kg)    Health Maintenance Due  Topic Date Due   HPV VACCINES (2 - 3-dose series) 07/18/2017       Topic Date Due   HPV VACCINES (2 - 3-dose series) 07/18/2017     Lab Results  Component Value Date   TSH 1.500 10/01/2014   Lab Results  Component Value Date   WBC 7.7 09/10/2020   HGB 12.3 09/10/2020  HCT 36.4 09/10/2020   MCV 90 09/10/2020   PLT 288 09/10/2020   Lab Results  Component Value Date   NA 141 09/10/2020   K 5.1 09/10/2020   CO2 22 09/10/2020   GLUCOSE 81 09/10/2020   BUN 10 09/10/2020   CREATININE 0.73 09/10/2020   BILITOT 0.8 09/10/2020   ALKPHOS 70 09/10/2020   AST 20 09/10/2020   ALT 15 09/10/2020   PROT 6.9 09/10/2020   ALBUMIN 4.6 09/10/2020   CALCIUM 9.5 09/10/2020   No results found for: CHOL No results found for: HDL No results found for: LDLCALC No results found for: TRIG No results found for: CHOLHDL No results found for: YVOP9Y     Assessment & Plan:   Kristen Mejia was seen today for std screening.  Diagnoses and all orders for this visit:  Screening for STD (sexually transmitted disease) Labs pending as below. Safe sex discussed and handout given. Will notify patient of results.  -     HepB+HepC+HIV Panel -     RPR -     HIV antibody (with reflex) -     GC/Chlamydia Probe Amp  Return to office for new or worsening  symptoms, or if symptoms persist.   The patient indicates understanding of these issues and agrees with the plan.  Gabriel Earing, FNP

## 2021-07-12 ENCOUNTER — Telehealth: Payer: BC Managed Care – PPO | Admitting: Family

## 2021-07-12 DIAGNOSIS — K120 Recurrent oral aphthae: Secondary | ICD-10-CM

## 2021-07-12 MED ORDER — TRIAMCINOLONE ACETONIDE 0.1 % MT PSTE: 1.0000 "application " | PASTE | Freq: Two times a day (BID) | OROMUCOSAL | 0 refills | Status: DC

## 2021-07-12 NOTE — Progress Notes (Signed)
Virtual Visit Consent   [name], you are scheduled for a virtual visit with a [facility] provider today.     Just as with appointments in the office, your consent must be obtained to participate.  Your consent will be active for this visit and any virtual visit you may have with one of our providers in the next 365 days.     If you have a MyChart account, a copy of this consent can be sent to you electronically.  All virtual visits are billed to your insurance company just like a traditional visit in the office.    As this is a virtual visit, video technology does not allow for your provider to perform a traditional examination.  This may limit your provider's ability to fully assess your condition.  If your provider identifies any concerns that need to be evaluated in person or the need to arrange testing (such as labs, EKG, etc.), we will make arrangements to do so.     Although advances in technology are sophisticated, we cannot ensure that it will always work on either your end or our end.  If the connection with a video visit is poor, the visit may have to be switched to a telephone visit.  With either a video or telephone visit, we are not always able to ensure that we have a secure connection.     I need to obtain your verbal consent now.   Are you willing to proceed with your visit today?    [name] has provided verbal consent on 07/12/2021 for a virtual visit (video or telephone).   Kristen Rodney, FNP   Date: 07/12/2021 8:53 AM   Virtual Visit via Video Note   I, Kristen Mejia, connected with  Kristen Mejia  ([id number], 03-20-2001) on 07/12/21 at  8:45 AM EST by a video-enabled telemedicine application and verified that I am speaking with the correct person using two identifiers.  Location: Patient: Virtual Visit Location Patient: Home Provider: Virtual Visit Location Provider: Home Office   I discussed the limitations of evaluation and management by telemedicine and  the availability of in person appointments. The patient expressed understanding and agreed to proceed.    History of Present Illness: Kristen Mejia is a 20 y.o. who identifies as a female who was assigned female at birth, and is being seen today for a lesion right inner jaw that she noticed yesterday. Unsure if she bite her jaw, but this morning it is larger. She report mild aching pain of 3 out 10.   HPI: HPI  Problems:  Patient Active Problem List   Diagnosis Date Noted   Irregular periods 06/10/2021   Irritable bowel syndrome with both constipation and diarrhea 10/20/2018   GAD (generalized anxiety disorder) 04/25/2018    Allergies: No Known Allergies Medications:  Current Outpatient Medications:    triamcinolone (KENALOG) 0.1 % paste, Use as directed 1 application in the mouth or throat 2 (two) times daily., Disp: 10 g, Rfl: 0  Observations/Objective: Patient is well-developed, well-nourished in no acute distress.  Resting comfortably  at home.  Head is normocephalic, atraumatic.  No labored breathing.  Speech is clear and coherent with logical content.  Patient is alert and oriented at baseline.  Small ulcer in right inner cheek  Assessment and Plan: 1. Canker sore - triamcinolone (KENALOG) 0.1 % paste; Use as directed 1 application in the mouth or throat 2 (two) times daily.  Dispense: 10 g; Refill: 0 Avoid spicy or salty  foods Use Kenalog cream BID Rinse mouth after meals Follow up if symptoms worsen or do not improve   Follow Up Instructions: I discussed the assessment and treatment plan with the patient. The patient was provided an opportunity to ask questions and all were answered. The patient agreed with the plan and demonstrated an understanding of the instructions.  A copy of instructions were sent to the patient via MyChart unless otherwise noted below.     The patient was advised to call back or seek an in-person evaluation if the symptoms worsen or if the  condition fails to improve as anticipated.  Time:  I spent 11 minutes with the patient via telehealth technology discussing the above problems/concerns.    Kristen Rodney, FNP

## 2021-07-12 NOTE — Patient Instructions (Signed)
Canker Sores ?Canker sores are small, painful sores that develop inside your mouth. You can get one or more canker sores on the inside of your lips or cheeks, on your tongue, or anywhere inside your mouth. Canker sores cannot be passed from person to person (are not contagious). These sores are different from the sores that you may get on the outside of your lips (cold sores or fever blisters). ?What are the causes? ?The cause of this condition is not known. The condition may be passed down from a parent (genetic). ?What increases the risk? ?This condition is more likely to develop in: ?Women. ?People in their teens or 20s. ?Women who are having their menstrual period. ?People who are under a lot of emotional stress. ?People who do not get enough iron or B vitamins. ?People who do not take care of their mouth and teeth (have poor oral hygiene). ?People who have an injury inside the mouth, such as after having dental work or from chewing something hard. ?What are the signs or symptoms? ?Canker sores usually start as painful red bumps. Then they turn into small white, yellow, or gray sores that have red borders. The sores may be painful, and the pain may get worse when you eat or drink. Along with the canker sore, symptoms may also include: ?Fever. ?Fatigue. ?Swollen lymph nodes in your neck. ?How is this diagnosed? ?This condition may be diagnosed based on your symptoms and an exam of the inside of your mouth. If you get canker sores often or if they are very bad, you may have tests, such as: ?Blood tests to rule out possible causes. ?Swabbing a fluid sample from the sore to be tested for infection. ?Removing a small tissue sample from the sore (biopsy) to test it for cancer. ?How is this treated? ?Most canker sores go away without treatment in about 1 week. Home care is usually the only treatment that you will need. Over-the-counter medicines can relieve discomfort. If you have severe canker sores, your health care  provider may prescribe: ?Numbing ointment to relieve pain. ?Do not use numbing gels or products containing benzocaine in children who are 2 years of age or younger. ?Vitamins. ?Steroid medicines. These may be given as pills, mouth rinses, or gels. ?Antibiotic mouth rinse. ?Follow these instructions at home: ? ?Apply, take, or use over-the-counter and prescription medicines only as told by your health care provider. These include vitamins and ointments. ?If you were prescribed an antibiotic mouth rinse, use it as told by your health care provider. Do not stop using the antibiotic even if your condition improves. ?Until the sores are healed: ?Do not drink coffee or citrus juices. ?Do not eat spicy or salty foods. ?Use a mild, over-the-counter mouth rinse as recommended by your health care provider. ?Practice good oral hygiene by: ?Flossing your teeth every day. ?Brushing your teeth with a soft toothbrush twice each day. ?Contact a health care provider if: ?Your symptoms do not get better after 2 weeks. ?You also have a fever or swollen glands in your neck. ?You get canker sores often. ?You have a canker sore that is getting larger. ?You cannot eat or drink due to your canker sores. ?Summary ?Canker sores are small, painful sores that develop inside your mouth. ?Canker sores usually start as painful red bumps that turn into small white, yellow, or gray sores that have red borders. ?The sores may be quite painful, and the pain may get worse when you eat or drink. ?Most canker sores   clear up without treatment in about 1 week. Over-the-counter medicines can relieve discomfort. ?This information is not intended to replace advice given to you by your health care provider. Make sure you discuss any questions you have with your health care provider. ?Document Revised: 01/29/2021 Document Reviewed: 04/18/2020 ?Elsevier Patient Education ? 2022 [employer] ? ?

## 2021-07-13 ENCOUNTER — Telehealth: Payer: Self-pay | Admitting: Family

## 2021-07-13 LAB — HEPB+HEPC+HIV PANEL
HIV Screen 4th Generation wRfx: NONREACTIVE
Hep B C IgM: NEGATIVE
Hep B Core Total Ab: POSITIVE — AB
Hep B E Ab: NEGATIVE
Hep B E Ag: NEGATIVE
Hep B Surface Ab, Qual: REACTIVE
Hep C Virus Ab: 0.2 s/co ratio (ref 0.0–0.9)
Hepatitis B Surface Ag: NEGATIVE

## 2021-07-13 LAB — RPR: RPR Ser Ql: NONREACTIVE

## 2021-07-13 NOTE — Telephone Encounter (Signed)
Pt called stating that she sees some of her lab results on her my chart and needs clarification about the HEP B that says positive and reactive.   Please advise and call patient.

## 2021-07-13 NOTE — Telephone Encounter (Signed)
Please see result note 

## 2021-07-15 ENCOUNTER — Other Ambulatory Visit: Payer: Self-pay | Admitting: Family Medicine

## 2021-07-15 DIAGNOSIS — N760 Acute vaginitis: Secondary | ICD-10-CM

## 2021-07-15 LAB — CT, NG, MYCOPLASMAS NAA, URINE
Chlamydia trachomatis, NAA: NEGATIVE
Mycoplasma genitalium NAA: POSITIVE — AB
Mycoplasma hominis NAA: NEGATIVE
Neisseria gonorrhoeae, NAA: NEGATIVE
Ureaplasma spp NAA: POSITIVE — AB

## 2021-07-15 MED ORDER — DOXYCYCLINE HYCLATE 100 MG PO TABS
100.0000 mg | ORAL_TABLET | Freq: Two times a day (BID) | ORAL | 0 refills | Status: AC
Start: 2021-07-15 — End: 2021-07-22

## 2021-07-21 NOTE — Telephone Encounter (Signed)
Results have been discussed with patient since her phone call by patient message.

## 2021-09-23 ENCOUNTER — Ambulatory Visit: Payer: BC Managed Care – PPO | Admitting: Family Medicine

## 2021-09-30 ENCOUNTER — Encounter: Payer: Self-pay | Admitting: Family Medicine

## 2021-09-30 ENCOUNTER — Ambulatory Visit (INDEPENDENT_AMBULATORY_CARE_PROVIDER_SITE_OTHER): Payer: BC Managed Care – PPO | Admitting: Family Medicine

## 2021-09-30 DIAGNOSIS — R1011 Right upper quadrant pain: Secondary | ICD-10-CM

## 2021-09-30 NOTE — Progress Notes (Signed)
Virtual Visit via telephone Note  I connected with Kristen Mejia on 09/30/21 at 1707 by telephone and verified that I am speaking with the correct person using two identifiers. Kristen Mejia is currently located at home and patient are currently with her during visit. The provider, Kristen Kaufmann Annaliyah Willig, MD is located in their office at time of visit.  Call ended at 1719  I discussed the limitations, risks, security and privacy concerns of performing an evaluation and management service by telephone and the availability of in person appointments. I also discussed with the patient that there may be a patient responsible charge related to this service. The patient expressed understanding and agreed to proceed.   History and Present Illness: Patient is calling for right side pain that started yesterday.  This worsened today. She has pain that is worsening. She denies bowel or urinary symptoms.  She denies nausea but it hurts more if eats.  Worse after and during bowel movements.  She denies recent trauma. She describes it more of a sharp abdominal pain in 1 specific spot. She had this in the past and had a kidney stone.   1. Right upper quadrant abdominal pain     Outpatient Encounter Medications as of 09/30/2021  Medication Sig   triamcinolone (KENALOG) 0.1 % paste Use as directed 1 application in the mouth or throat 2 (two) times daily.   No facility-administered encounter medications on file as of 09/30/2021.    Review of Systems  Constitutional:  Negative for chills and fever.  Eyes:  Negative for visual disturbance.  Respiratory:  Negative for chest tightness and shortness of breath.   Cardiovascular:  Negative for chest pain and leg swelling.  Gastrointestinal:  Positive for abdominal pain and nausea. Negative for blood in stool, constipation, diarrhea and vomiting.  Genitourinary:  Negative for difficulty urinating, dysuria, flank pain and hematuria.  Musculoskeletal:  Negative for  back pain and gait problem.  Skin:  Negative for rash.  Neurological:  Negative for light-headedness and headaches.  Psychiatric/Behavioral:  Negative for agitation and behavioral problems.   All other systems reviewed and are negative.  Observations/Objective: Patient sounds comfortable and in no acute distress  Assessment and Plan: Problem List Items Addressed This Visit   None Visit Diagnoses     Right upper quadrant abdominal pain    -  Primary     Due to nature of abdominal pain we will wave this visit and turned into an in person visit tomorrow morning.   Want her to leave urine and do a physical exam and then we can assess whether or not she needs a CT scan Follow up plan: Return if symptoms worsen or fail to improve.     I discussed the assessment and treatment plan with the patient. The patient was provided an opportunity to ask questions and all were answered. The patient agreed with the plan and demonstrated an understanding of the instructions.   The patient was advised to call back or seek an in-person evaluation if the symptoms worsen or if the condition fails to improve as anticipated.  The above assessment and management plan was discussed with the patient. The patient verbalized understanding of and has agreed to the management plan. Patient is aware to call the clinic if symptoms persist or worsen. Patient is aware when to return to the clinic for a follow-up visit. Patient educated on when it is appropriate to go to the emergency department.    I provided 12  minutes of non-face-to-face time during this encounter.    Worthy Rancher, MD

## 2021-10-01 ENCOUNTER — Ambulatory Visit: Payer: BC Managed Care – PPO | Admitting: Family Medicine

## 2021-11-04 ENCOUNTER — Ambulatory Visit (INDEPENDENT_AMBULATORY_CARE_PROVIDER_SITE_OTHER): Payer: BC Managed Care – PPO | Admitting: Nurse Practitioner

## 2021-11-04 ENCOUNTER — Encounter: Payer: Self-pay | Admitting: Nurse Practitioner

## 2021-11-04 DIAGNOSIS — R110 Nausea: Secondary | ICD-10-CM

## 2021-11-04 DIAGNOSIS — R109 Unspecified abdominal pain: Secondary | ICD-10-CM

## 2021-11-04 MED ORDER — NAPROXEN 500 MG PO TABS: 500.0000 mg | ORAL_TABLET | Freq: Two times a day (BID) | ORAL | 0 refills | Status: DC

## 2021-11-04 MED ORDER — ONDANSETRON HCL 4 MG PO TABS: 4.0000 mg | ORAL_TABLET | Freq: Three times a day (TID) | ORAL | 0 refills | Status: DC | PRN

## 2021-11-04 NOTE — Progress Notes (Signed)
? ?  Virtual Visit  Note ?Due to COVID-19 pandemic this visit was conducted virtually. This visit type was conducted due to national recommendations for restrictions regarding the COVID-19 Pandemic (e.g. social distancing, sheltering in place) in an effort to limit this patient's exposure and mitigate transmission in our community. All issues noted in this document were discussed and addressed.  A physical exam was not performed with this format. ? ?I connected with Kristen Mejia on 11/04/21 at 2:30 pm  by telephone and verified that I am speaking with the correct person using two identifiers. Harlan Vinal is currently located at home  during visit. The provider, Daryll Drown, NP is located in their office at time of visit. ? ?I discussed the limitations, risks, security and privacy concerns of performing an evaluation and management service by telephone and the availability of in person appointments. I also discussed with the patient that there may be a patient responsible charge related to this service. The patient expressed understanding and agreed to proceed. ? ? ?History and Present Illness: ? ?Abdominal Pain ?This is a new problem. The current episode started yesterday. The onset quality is sudden. The problem is unchanged. The pain is located in the LLQ and RLQ. The pain is at a severity of 7/10. The pain is severe. The quality of the pain is described as cramping and aching. The pain does not radiate. Associated symptoms include nausea. Pertinent negatives include no anorexia, fever or rash. Nothing relieves the symptoms. Treatments tried: Midol. The treatment provided no improvement relief.  ? ? ? ?Review of Systems  ?Constitutional: Negative.  Negative for fever and malaise/fatigue.  ?HENT: Negative.    ?Respiratory: Negative.    ?Cardiovascular: Negative.   ?Gastrointestinal:  Positive for abdominal pain and nausea. Negative for anorexia.  ?Skin: Negative.  Negative for rash.  ?All other systems  reviewed and are negative. ? ? ?Observations/Objective: ?Televisit patient not in distress ? ?Assessment and Plan: ?Patient presents with abdominal pain/cramping caused from monthly menstrual period ?-Encourage patient to increase hydration ?-Naproxen 500 mg tablet by mouth every 12 hours as needed for pain ?-Zofran for nausea ? ? ?Follow Up Instructions: ?Follow-up for worsening unresolved symptoms. ? ?  ?I discussed the assessment and treatment plan with the patient. The patient was provided an opportunity to ask questions and all were answered. The patient agreed with the plan and demonstrated an understanding of the instructions. ?  ?The patient was advised to call back or seek an in-person evaluation if the symptoms worsen or if the condition fails to improve as anticipated. ? ?The above assessment and management plan was discussed with the patient. The patient verbalized understanding of and has agreed to the management plan. Patient is aware to call the clinic if symptoms persist or worsen. Patient is aware when to return to the clinic for a follow-up visit. Patient educated on when it is appropriate to go to the emergency department.  ? ?Time call ended: 2:41 PM ? ?I provided 11 minutes of  non face-to-face time during this encounter. ? ? ? ?Daryll Drown, NP ?  ?

## 2021-11-05 ENCOUNTER — Telehealth: Payer: Self-pay | Admitting: Family

## 2021-11-05 NOTE — Telephone Encounter (Signed)
Okay for extended note? Please advise   ?

## 2021-11-05 NOTE — Telephone Encounter (Signed)
Patient aware and verbalized understanding. °

## 2021-11-05 NOTE — Telephone Encounter (Signed)
Ok to extend note. Note sent to MyChart ? ?[name], FNP ? ?

## 2022-03-04 ENCOUNTER — Encounter: Payer: Self-pay | Admitting: Family

## 2022-03-04 ENCOUNTER — Ambulatory Visit: Payer: BC Managed Care – PPO | Admitting: Family Medicine

## 2022-03-26 IMAGING — CT CT RENAL STONE PROTOCOL
2 of 6 series · 15 of 46 positions shown, 18 images · non-contrast
Comparison: 09/26/2019

CLINICAL DATA: Right flank pain.  Microscopic hematuria for 3 days.

EXAM:
CT ABDOMEN AND PELVIS WITHOUT CONTRAST
TECHNIQUE: Multidetector CT imaging of the abdomen and pelvis was performed
following the standard protocol without IV contrast.

[Series 2: axial st · axial · 0.59mm/px · z∈[+833,+1218]mm · 12 of 88 slices shown, 15 images]
[im 7/88  soft-tissue]
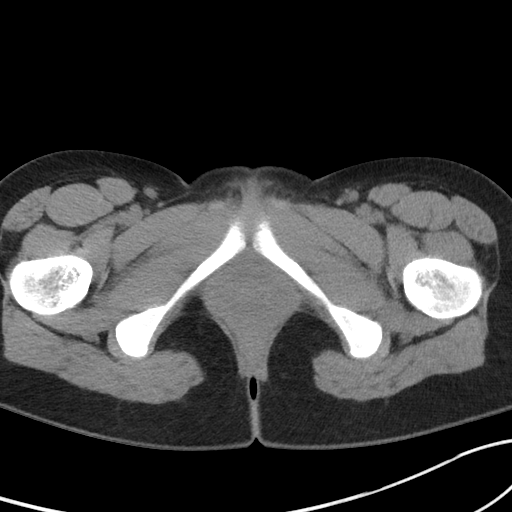
[im 7/88  bone]
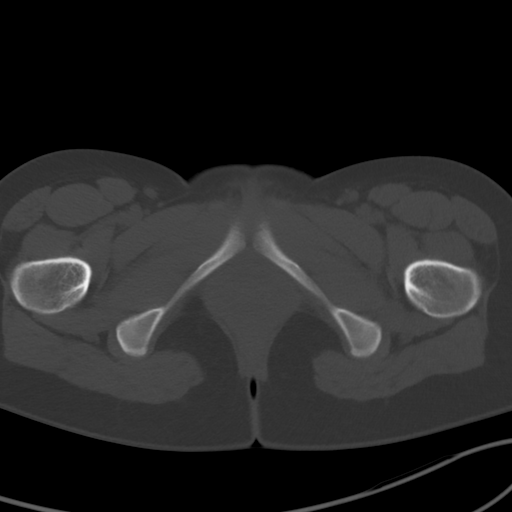
[im 17/88  soft-tissue]
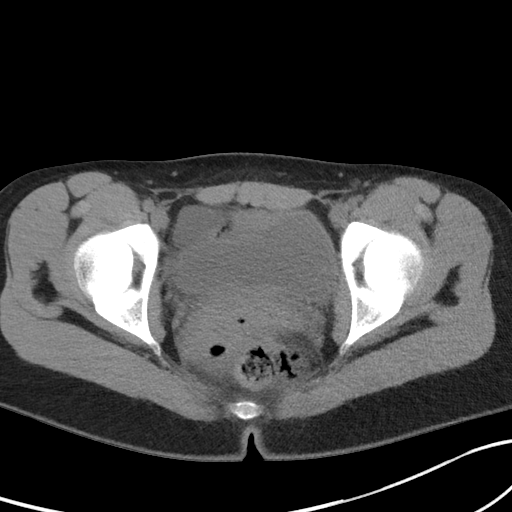
[im 26/88  soft-tissue]
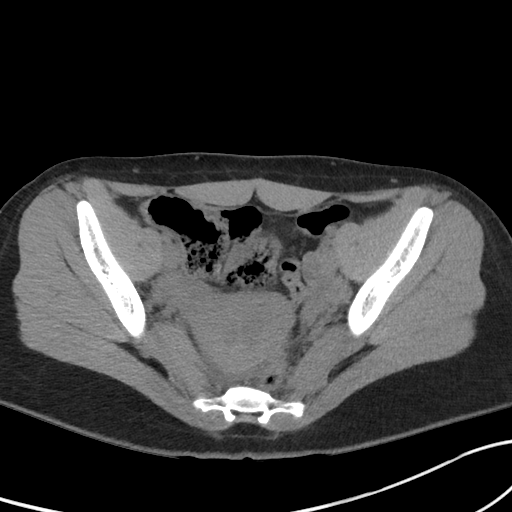
[im 36/88  soft-tissue]
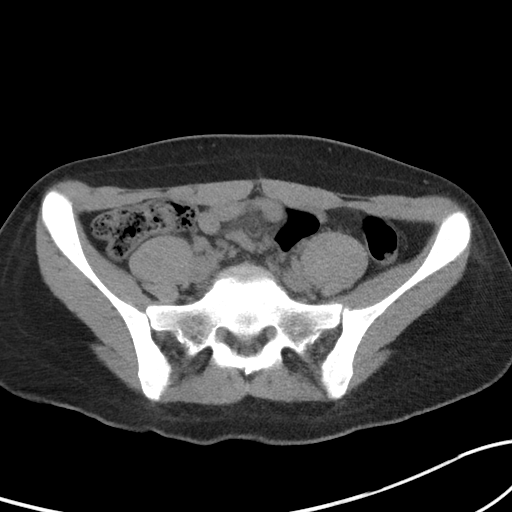
[im 46/88  soft-tissue]
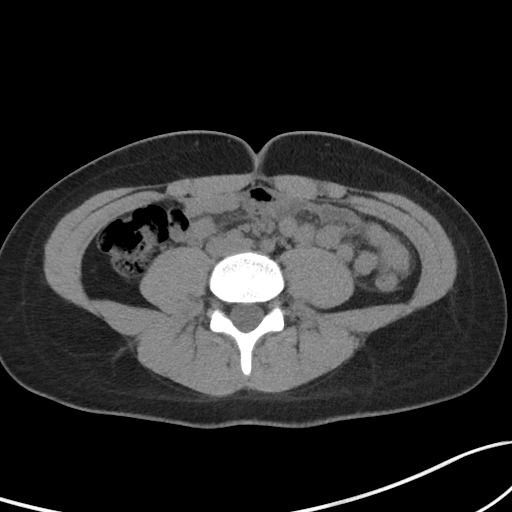
[im 52/88  soft-tissue]
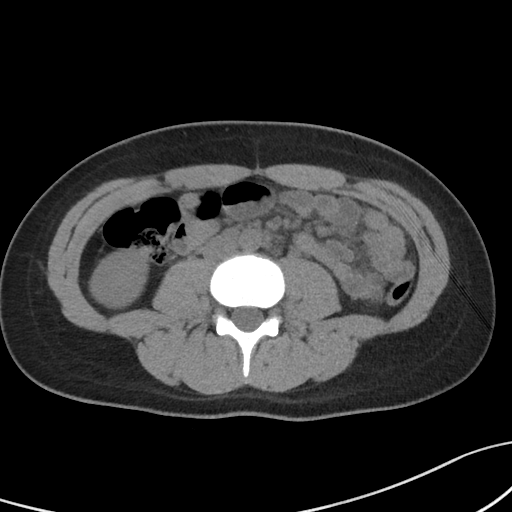
[im 62/88  soft-tissue]
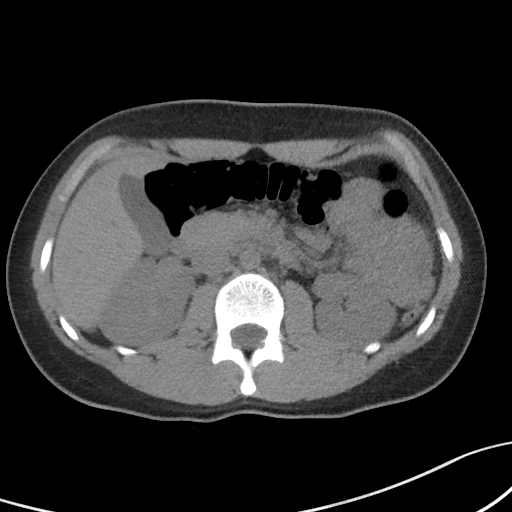
[im 71/88  soft-tissue]
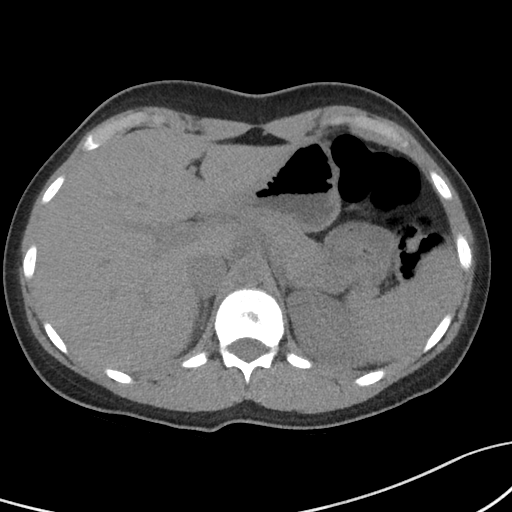
[im 75/88  lung]
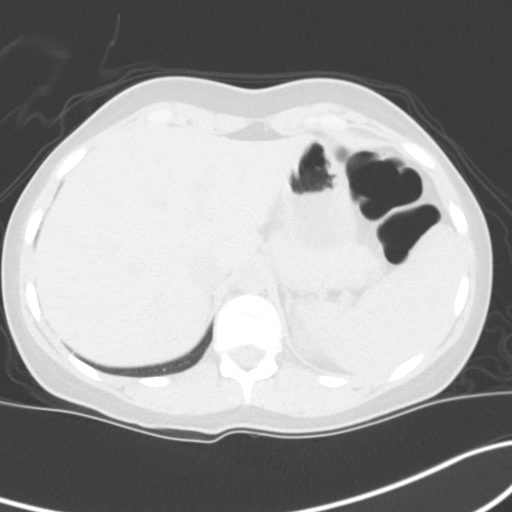
[im 78/88  lung]
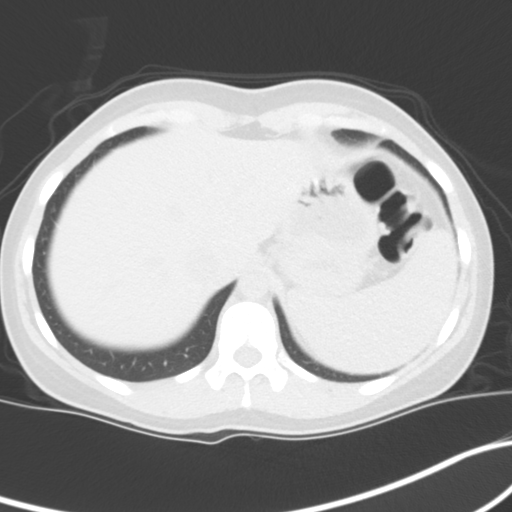
[im 81/88  soft-tissue]
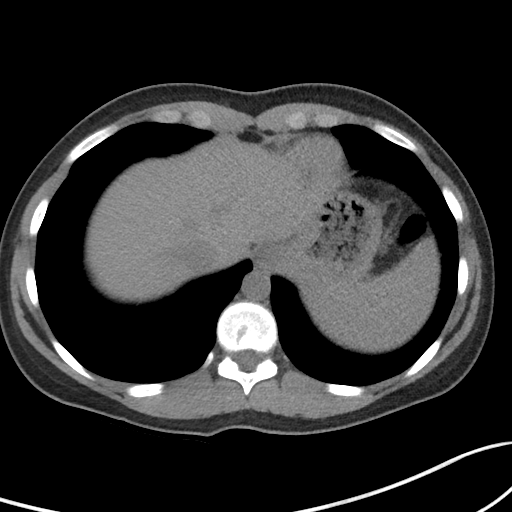
[im 81/88  lung]
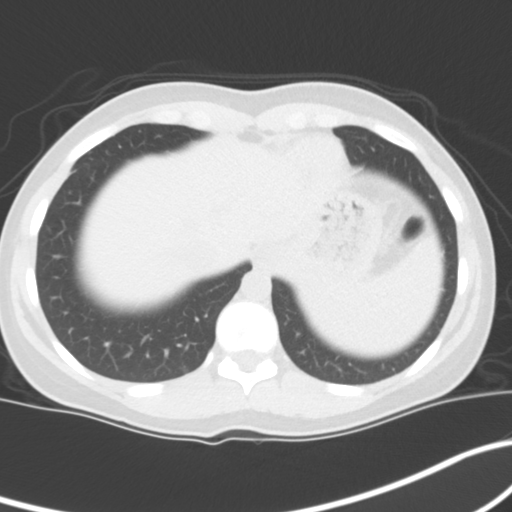
[im 81/88  bone]
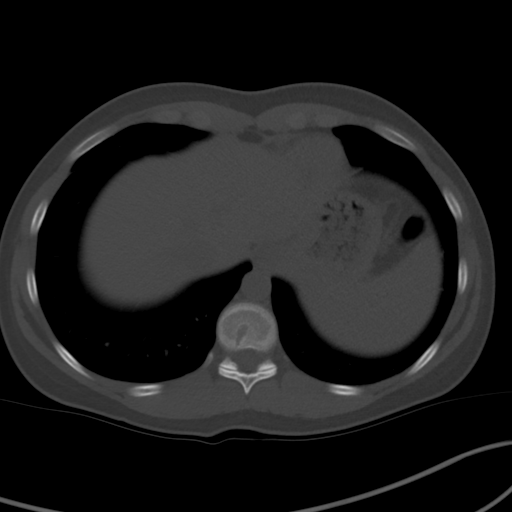
[im 84/88  lung]
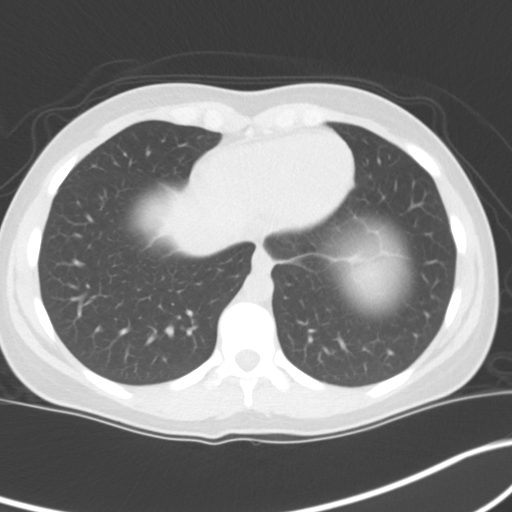

[Series 6: coronal st · coronal · 0.72mm/px · 3 of 77 slices shown]
[im 20/77  soft-tissue]
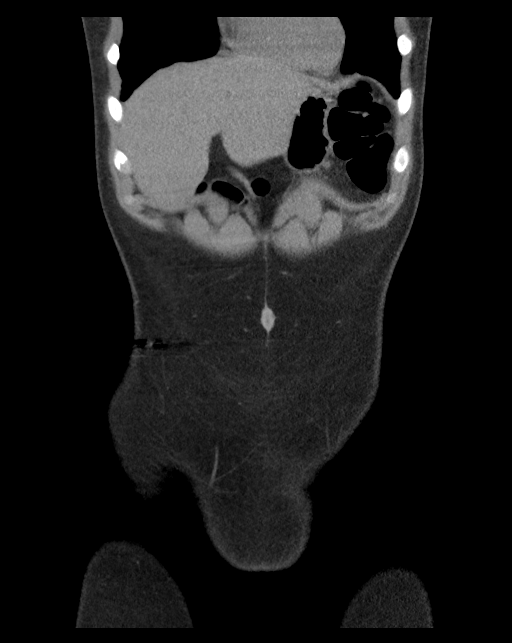
[im 39/77  soft-tissue]
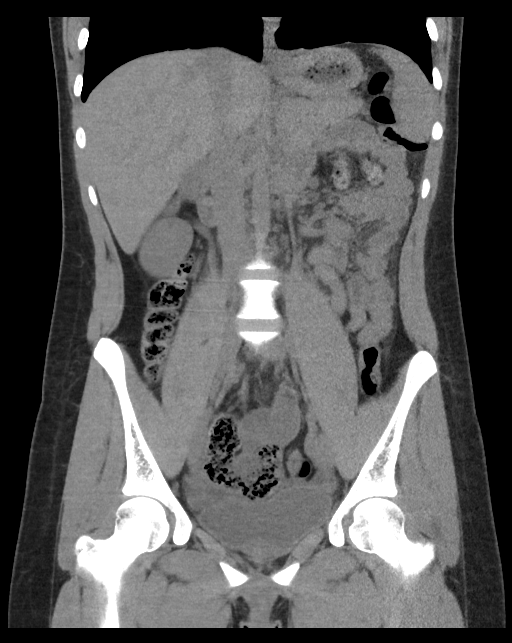
[im 58/77  soft-tissue]
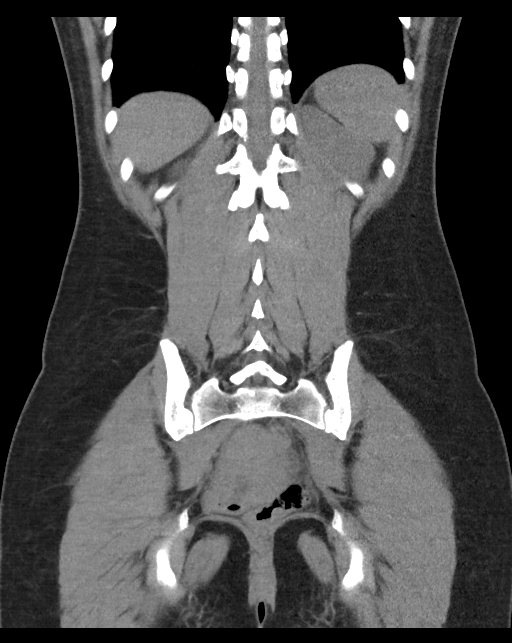

[15 of 46 positions shown; findings below may reference images not displayed]

FINDINGS: Lower chest: Unremarkable

Hepatobiliary: Unremarkable

Pancreas: Unremarkable

Spleen: Unremarkable

Adrenals/Urinary Tract: 2 mm left mid kidney nonobstructive renal
calculus on image 51 series 6.

Borderline right hydroureter. A punctate calcification posterior to
the urinary bladder on image 70 of series 2 is probably vascular
rather than a distal ureteral stone, but was not readily apparent on
the prior CT of 09/26/2019. Tissue planes in this region are
indistinct.

Adrenal glands unremarkable.

Stomach/Bowel: Unremarkable

Vascular/Lymphatic: Scattered small retroperitoneal lymph nodes do
not appear pathologically enlarged by size criteria.

Reproductive: Trace free pelvic fluid. The uterus is not assessed in
great detail, but the possibility of a septate uterus is raised.

Other: Small amount of free pelvic fluid.

Musculoskeletal: Schmorl's nodes at T12 and potentially also at
lower thoracic levels.
IMPRESSION: 1. Borderline right hydroureter. A punctate calcification posterior
to the urinary bladder is probably vascular rather than a distal
ureteral stone, but was not readily apparent on the prior CT of
09/26/2019.
[DATE]. [DATE] mm left mid kidney nonobstructive renal calculus.
3. Small amount of free pelvic fluid, nonspecific.
4. The uterus is not assessed in great detail, but the possibility
of a septate uterus is raised. Consider pelvic sonography to further
investigate the uterus, and also better characterize the ovaries in
the setting of the small amount of free pelvic fluid.

## 2022-04-09 ENCOUNTER — Encounter (HOSPITAL_COMMUNITY): Payer: Self-pay

## 2022-04-09 ENCOUNTER — Other Ambulatory Visit: Payer: Self-pay | Admitting: Family

## 2022-04-09 ENCOUNTER — Ambulatory Visit (INDEPENDENT_AMBULATORY_CARE_PROVIDER_SITE_OTHER): Payer: BC Managed Care – PPO | Admitting: Family

## 2022-04-09 ENCOUNTER — Ambulatory Visit (HOSPITAL_COMMUNITY): Admission: RE | Admit: 2022-04-09 | Payer: BC Managed Care – PPO | Source: Ambulatory Visit

## 2022-04-09 ENCOUNTER — Ambulatory Visit (HOSPITAL_COMMUNITY)
Admission: RE | Admit: 2022-04-09 | Discharge: 2022-04-09 | Disposition: A | Discharge status: Home / Self Care | Payer: BC Managed Care – PPO | Source: Ambulatory Visit | Attending: Family | Admitting: Family

## 2022-04-09 ENCOUNTER — Encounter: Payer: Self-pay | Admitting: Family

## 2022-04-09 VITALS — BP 109/73 | HR 58 | Temp 97.7°F | Ht 62.0 in | Wt 138.0 lb

## 2022-04-09 DIAGNOSIS — R1031 Right lower quadrant pain: Secondary | ICD-10-CM | POA: Insufficient documentation

## 2022-04-09 DIAGNOSIS — R109 Unspecified abdominal pain: Secondary | ICD-10-CM | POA: Diagnosis not present

## 2022-04-09 LAB — URINALYSIS, COMPLETE
Bilirubin, UA: NEGATIVE
Glucose, UA: NEGATIVE
Ketones, UA: NEGATIVE
Leukocytes,UA: NEGATIVE
Nitrite, UA: NEGATIVE
Protein,UA: NEGATIVE
Specific Gravity, UA: >1.03 — ABNORMAL HIGH (ref 1.005–1.030)
Urobilinogen, Ur: 0.2 mg/dL (ref 0.2–1.0)
pH, UA: 5.5 (ref 5.0–7.5)

## 2022-04-09 LAB — MICROSCOPIC EXAMINATION
RBC, Urine: NONE SEEN /hpf (ref 0–2)
Renal Epithel, UA: NONE SEEN /hpf
WBC, UA: NONE SEEN /hpf (ref 0–5)

## 2022-04-09 LAB — CBC WITH DIFFERENTIAL/PLATELET
Basophils Absolute: 0.1 10*3/uL (ref 0.0–0.2)
Basos: 1 %
EOS (ABSOLUTE): 0.6 10*3/uL — ABNORMAL HIGH (ref 0.0–0.4)
Eos: 8 %
Hematocrit: 40.8 % (ref 34.0–46.6)
Hemoglobin: 13.9 g/dL (ref 11.1–15.9)
Immature Grans (Abs): 0 10*3/uL (ref 0.0–0.1)
Immature Granulocytes: 0 %
Lymphocytes Absolute: 1.6 10*3/uL (ref 0.7–3.1)
Lymphs: 22 %
MCH: 31.4 pg (ref 26.6–33.0)
MCHC: 34.1 g/dL (ref 31.5–35.7)
MCV: 92 fL (ref 79–97)
Monocytes Absolute: 0.6 10*3/uL (ref 0.1–0.9)
Monocytes: 8 %
Neutrophils Absolute: 4.5 10*3/uL (ref 1.4–7.0)
Neutrophils: 61 %
Platelets: 317 10*3/uL (ref 150–450)
RBC: 4.43 x10E6/uL (ref 3.77–5.28)
RDW: 12.1 % (ref 11.7–15.4)
WBC: 7.3 10*3/uL (ref 3.4–10.8)

## 2022-04-09 MED ORDER — IOHEXOL 300 MG/ML  SOLN
100.0000 mL | Freq: Once | INTRAMUSCULAR | Status: AC | PRN
  Administered 2022-04-09: 100 mL via INTRAVENOUS

## 2022-04-09 NOTE — Progress Notes (Addendum)
   Subjective:    Patient ID: Kristen Mejia, female    DOB: 2001-03-19, 21 y.o.   MRN: [id number]  Chief Complaint  Patient presents with   Abdominal Pain   PT presents to the office today today with right lower abdominal pain that started a few weeks ago that has worsen.   Denies any fever, constipation, diarrhea, or dysuria. Does report decrease appetite.  Abdominal Pain This is a new problem. The current episode started in the past 7 days. The onset quality is gradual. The problem occurs intermittently. The pain is located in the RLQ. The pain is at a severity of 5/10. The pain is mild. The pain does not radiate. Relieved by: walking, lifting. Treatments tried: motrin. The treatment provided mild relief.      Review of Systems  Gastrointestinal:  Positive for abdominal pain.  All other systems reviewed and are negative.      Objective:   Physical Exam Vitals reviewed.  Constitutional:      General: She is not in acute distress.    Appearance: She is well-developed.  HENT:     Head: Normocephalic and atraumatic.     Right Ear: External ear normal.  Eyes:     Pupils: Pupils are equal, round, and reactive to light.  Neck:     Thyroid: No thyromegaly.  Cardiovascular:     Rate and Rhythm: Normal rate and regular rhythm.     Heart sounds: Normal heart sounds. No murmur heard. Pulmonary:     Effort: Pulmonary effort is normal. No respiratory distress.     Breath sounds: Normal breath sounds. No wheezing.  Abdominal:     General: Bowel sounds are normal. There is no distension.     Palpations: Abdomen is soft.     Tenderness: There is abdominal tenderness in the right upper quadrant.  Musculoskeletal:        General: No tenderness. Normal range of motion.     Cervical back: Normal range of motion and neck supple.  Skin:    General: Skin is warm and dry.  Neurological:     Mental Status: She is alert and oriented to person, place, and time.     Cranial Nerves: No cranial  nerve deficit.     Deep Tendon Reflexes: Reflexes are normal and symmetric.  Psychiatric:        Behavior: Behavior normal.        Thought Content: Thought content normal.        Judgment: Judgment normal.     BP 109/73   Pulse (!) 58   Temp 97.7 F (36.5 C) (Temporal)   Ht 5\' 2"  (1.575 m)   Wt 138 lb (62.6 kg)   LMP 03/25/2022 (Approximate)   BMI 25.24 kg/m      Assessment & Plan:  Kristen Mejia comes in today with chief complaint of Abdominal Pain   Diagnosis and orders addressed:  1. Right lower quadrant abdominal pain Stat [name] ordered to rule out appendix  Do not eat or drink until cleared Appendix Vs ovarian cyst? Labs pending  - Urinalysis, Complete - CBC with Differential/Platelet - CT Abdomen Pelvis W Contrast; Future     [place], FNP

## 2022-04-09 NOTE — Addendum Note (Signed)
Addended by: [name] A on: 04/09/2022 04:30 PM   Modules accepted: Orders

## 2022-04-09 NOTE — Patient Instructions (Signed)

## 2022-04-09 NOTE — Addendum Note (Signed)
Addended by: [name] A on: 04/09/2022 12:08 PM   Modules accepted: Orders

## 2022-04-11 LAB — URINE CULTURE

## 2022-05-05 ENCOUNTER — Ambulatory Visit: Payer: BC Managed Care – PPO | Admitting: Family Medicine

## 2022-05-06 ENCOUNTER — Encounter: Payer: Self-pay | Admitting: Family

## 2022-05-28 ENCOUNTER — Encounter: Payer: Self-pay | Admitting: Family

## 2022-05-28 ENCOUNTER — Telehealth (INDEPENDENT_AMBULATORY_CARE_PROVIDER_SITE_OTHER): Payer: BC Managed Care – PPO | Admitting: Family

## 2022-05-28 DIAGNOSIS — R1030 Lower abdominal pain, unspecified: Secondary | ICD-10-CM

## 2022-05-28 DIAGNOSIS — R109 Unspecified abdominal pain: Secondary | ICD-10-CM

## 2022-05-28 MED ORDER — DICYCLOMINE HCL 10 MG PO CAPS: 10.0000 mg | ORAL_CAPSULE | Freq: Three times a day (TID) | ORAL | 1 refills | Status: DC

## 2022-05-28 NOTE — Progress Notes (Signed)
Virtual Visit Consent   Kristen Mejia, you are scheduled for a virtual visit with a [facility] provider today. Just as with appointments in the office, your consent must be obtained to participate. Your consent will be active for this visit and any virtual visit you may have with one of our providers in the next 365 days. If you have a MyChart account, a copy of this consent can be sent to you electronically.  As this is a virtual visit, video technology does not allow for your provider to perform a traditional examination. This may limit your provider's ability to fully assess your condition. If your provider identifies any concerns that need to be evaluated in person or the need to arrange testing (such as labs, EKG, etc.), we will make arrangements to do so. Although advances in technology are sophisticated, we cannot ensure that it will always work on either your end or our end. If the connection with a video visit is poor, the visit may have to be switched to a telephone visit. With either a video or telephone visit, we are not always able to ensure that we have a secure connection.  By engaging in this virtual visit, you consent to the provision of healthcare and authorize for your insurance to be billed (if applicable) for the services provided during this visit. Depending on your insurance coverage, you may receive a charge related to this service.  I need to obtain your verbal consent now. Are you willing to proceed with your visit today? Delmar Dondero has provided verbal consent on 05/28/2022 for a virtual visit (video or telephone). Evelina Dun, FNP  Date: 05/28/2022 1:19 PM  Virtual Visit via Video Note   I, Evelina Dun, connected with  Kristen Mejia  ([id number], [birth year]) on 05/28/22 at  5:15 PM EDT by a video-enabled telemedicine application and verified that I am speaking with the correct person using two identifiers.  Location: Patient: Virtual Visit Location Patient:  Home Provider: Virtual Visit Location Provider: Home Office   I discussed the limitations of evaluation and management by telemedicine and the availability of in person appointments. The patient expressed understanding and agreed to proceed.    History of Present Illness: Kristen Mejia is a 21 y.o. who identifies as a female who was assigned female at birth, and is being seen today for abdominal pain. This pain is recurrent and we had thought it was related to an ovarian cyst. She had a CT scan that was negative.   HPI: Abdominal Pain This is a recurrent problem. The current episode started in the past 7 days (three days ago). The onset quality is gradual. The problem occurs intermittently. The pain is located in the suprapubic region. The pain is at a severity of 5/10. The pain is moderate. The quality of the pain is described as cramping. The pain does not radiate. Associated symptoms include diarrhea. Pertinent negatives include no anxiety, belching, constipation, dysuria, fever, flatus, myalgias, rash or sore throat. (Nausea ) Relieved by: heating pad.    Problems:  Patient Active Problem List   Diagnosis Date Noted   Irregular periods 06/10/2021   Irritable bowel syndrome with both constipation and diarrhea 10/20/2018   GAD (generalized anxiety disorder) 04/25/2018    Allergies: No Known Allergies Medications:  Current Outpatient Medications:    dicyclomine (BENTYL) 10 MG capsule, Take 1 capsule (10 mg total) by mouth 4 (four) times daily -  before meals and at bedtime., Disp: 90 capsule, Rfl: 1  Observations/Objective:  Patient is well-developed, well-nourished in no acute distress.  Resting comfortably  at home.  Head is normocephalic, atraumatic.  No labored breathing.  Speech is clear and coherent with logical content.  Patient is alert and oriented at baseline.    Assessment and Plan: 1. Lower abdominal pain - Ambulatory referral to Gastroenterology  2. Abdominal  cramping - dicyclomine (BENTYL) 10 MG capsule; Take 1 capsule (10 mg total) by mouth 4 (four) times daily -  before meals and at bedtime.  Dispense: 90 capsule; Refill: 1 - Ambulatory referral to Gastroenterology  Will do referral to GI Start Fiber  Bentyl as needed Follow up if symptoms worsen or do not improve   Follow Up Instructions: I discussed the assessment and treatment plan with the patient. The patient was provided an opportunity to ask questions and all were answered. The patient agreed with the plan and demonstrated an understanding of the instructions.  A copy of instructions were sent to the patient via MyChart unless otherwise noted below.    The patient was advised to call back or seek an in-person evaluation if the symptoms worsen or if the condition fails to improve as anticipated.  Time:  I spent 10 minutes with the patient via telehealth technology discussing the above problems/concerns.    Evelina Dun, FNP

## 2022-05-28 NOTE — Patient Instructions (Signed)
Irritable Bowel Syndrome, Adult  Irritable bowel syndrome (IBS) is a group of symptoms that affects the organs responsible for digestion (gastrointestinal tract, or GI tract). IBS is not one specific disease. To regulate how the GI tract works, the body sends signals back and forth between the intestines and the brain. If you have IBS, there may be a problem with these signals. As a result, the GI tract does not function normally. The intestines may become more sensitive and overreact to certain things. This may be especially true when you eat certain foods or when you are under stress. There are four main types of IBS. These may be determined based on the consistency of your stool (feces): IBS with mostly (predominance of) diarrhea. IBS with predominance of constipation. IBS with mixed bowel habits. This includes both diarrhea and constipation. IBS unclassified. This includes IBS that cannot be categorized into one of the other three main types. It is important to know which type of IBS you have. Certain treatments are more likely to be helpful for certain types of IBS. What are the causes? The exact cause of IBS is not known. What increases the risk? You may have a higher risk for IBS if you: Are female. Are younger than 40 years. Have a family history of IBS. Have a mental health condition, such as depression, anxiety, or post-traumatic stress disorder. Have had a bacterial infection of your GI tract. What are the signs or symptoms? Symptoms of IBS vary from person to person. The main symptom is abdominal pain or discomfort. Other symptoms usually include one or more of the following: Diarrhea, constipation, or both. Swelling or bloating in the abdomen. Feeling full after eating a small or regular-sized meal. Frequent gas. Mucus in the stool. A feeling of having more stool left after a bowel movement. Symptoms tend to come and go. They may be triggered by stress, mental health  conditions, or certain foods. How is this diagnosed? This condition may be diagnosed based on a physical exam, your medical history, and your symptoms. You may have tests, such as: Blood tests. Stool test. Colonoscopy. This is a procedure in which your GI tract is viewed with a long, thin, flexible tube. How is this treated? There is no cure for IBS, but treatment can help relieve symptoms. Treatment depends on the type of IBS you have, and may include: Changes to your diet, such as: Avoiding foods that cause symptoms. Drinking more water. Following a low-FODMAP (fermentable oligosaccharides, disaccharides, monosaccharides, and polyols) diet for up to 6 weeks, or as told by your health care provider. FODMAPs are sugars that are hard for some people to digest. Eating more fiber. Eating small meals at the same times every day. Medicines. These may include: Fiber supplements, if you have constipation. Medicine to control diarrhea (antidiarrheal medicines). Medicine to help control muscle tightening (spasms) in your GI tract (antispasmodic medicines). Medicines to help with mental health conditions, such as antidepressants. Talk therapy or counseling. Working with a dietitian to help create a food plan that is right for you. Managing your stress. Follow these instructions at home: Eating and drinking  Eat a healthy diet. Eat 5-6 small meals a day. Try to eat meals at about the same times each day. Do not eat large meals. Gradually eat more fiber-rich foods. These include whole grains, fruits, and vegetables. This may be especially helpful if you have IBS with constipation. Eat a diet low in FODMAPs. You may need to avoid foods such as   citrus fruits, cabbage, garlic, and onions. Drink enough fluid to keep your urine pale yellow. Keep a journal of foods that seem to trigger symptoms. Avoid foods and drinks that: Contain added sugar. Make your symptoms worse. These may include dairy  products, caffeinated drinks, and carbonated drinks. Alcohol use Do not drink alcohol if: Your health care provider tells you not to drink. You are pregnant, may be pregnant, or are planning to become pregnant. If you drink alcohol: Limit how much you have to: 0-1 drink a day for women. 0-2 drinks a day for men. Know how much alcohol is in your drink. In the [place] one drink equals one 12 oz bottle of beer (355 mL), one 5 oz glass of wine (148 mL), or one 1 oz glass of hard liquor (44 mL) General instructions Take over-the-counter and prescription medicines only as told by your health care provider. This includes supplements. Get enough exercise. Do at least 150 minutes of moderate-intensity exercise each week. Manage your stress. Getting enough sleep and exercise can help you manage stress. Keep all follow-up visits. This is important. This includes all visits with your health care provider and therapist. Where to find more information International Foundation for Functional Gastrointestinal Disorders: aboutibs.org National Institute of Diabetes and Digestive and Kidney Diseases: niddk.nih.gov Contact a health care provider if: You have constant pain. You lose weight. You have diarrhea that gets worse. You have bleeding from the rectum. You vomit often. You have a fever. Get help right away if: You have severe abdominal pain. You have diarrhea with symptoms of dehydration, such as dizziness or dry mouth. You have bloody or black stools. You have severe abdominal bloating. You have vomiting that does not stop. You have blood in your vomit. Summary Irritable bowel syndrome (IBS) is not one specific disease. It is a group of symptoms that affects digestion. Your intestines may become more sensitive and overreact to certain things. This may be especially true when you eat certain foods or when you are under stress. There is no cure for IBS, but treatment can help relieve  symptoms. This information is not intended to replace advice given to you by your health care provider. Make sure you discuss any questions you have with your health care provider. Document Revised: 07/08/2021 Document Reviewed: 07/08/2021 Elsevier Patient Education  2023 Elsevier Inc.  

## 2022-05-30 DIAGNOSIS — R1030 Lower abdominal pain, unspecified: Secondary | ICD-10-CM

## 2022-05-31 ENCOUNTER — Other Ambulatory Visit: Payer: BC Managed Care – PPO

## 2022-05-31 ENCOUNTER — Encounter: Payer: Self-pay | Admitting: Family

## 2022-05-31 ENCOUNTER — Ambulatory Visit (INDEPENDENT_AMBULATORY_CARE_PROVIDER_SITE_OTHER): Payer: BC Managed Care – PPO | Admitting: Family

## 2022-05-31 ENCOUNTER — Ambulatory Visit (HOSPITAL_COMMUNITY): Admission: RE | Admit: 2022-05-31 | Payer: BC Managed Care – PPO | Source: Ambulatory Visit

## 2022-05-31 DIAGNOSIS — R1032 Left lower quadrant pain: Secondary | ICD-10-CM | POA: Diagnosis not present

## 2022-05-31 DIAGNOSIS — R1030 Lower abdominal pain, unspecified: Secondary | ICD-10-CM | POA: Diagnosis not present

## 2022-05-31 NOTE — Progress Notes (Signed)
   Subjective:    Patient ID: Kristen Mejia, female    DOB: [birth year], 21 y.o.   MRN: [id number]  PT presents to the office today with recurrent left lower abdominal pain. She had a video visit on on 05/28/22 and was given Bentyl  for abdominal cramping. States this helped with nausea, but not pain. She had a negative CT on 04/09/22 and thought this was related to an ovarian cyst.   She reports her pain has worsen. We did place a referral to GI.   Denies any fever, diarrhea, or constipation. Abdominal Pain This is a recurrent problem. The current episode started in the past 7 days. The problem occurs constantly. The pain is located in the LLQ. The pain is at a severity of 7/10. The pain is moderate. The quality of the pain is described as cramping. Associated symptoms include nausea. Pertinent negatives include no fever, flatus or frequency.      Review of Systems  Constitutional:  Negative for fever.  Gastrointestinal:  Positive for abdominal pain and nausea. Negative for flatus.  Genitourinary:  Negative for frequency.  All other systems reviewed and are negative.      Objective:   Physical Exam Vitals reviewed.  Constitutional:      General: She is not in acute distress.    Appearance: She is well-developed.  HENT:     Head: Normocephalic and atraumatic.     Right Ear: Tympanic membrane normal.     Left Ear: Tympanic membrane normal.  Eyes:     Pupils: Pupils are equal, round, and reactive to light.  Neck:     Thyroid: No thyromegaly.  Cardiovascular:     Rate and Rhythm: Normal rate and regular rhythm.     Heart sounds: Normal heart sounds. No murmur heard. Pulmonary:     Effort: Pulmonary effort is normal. No respiratory distress.     Breath sounds: Normal breath sounds. No wheezing.  Abdominal:     General: Bowel sounds are normal. There is no distension.     Palpations: Abdomen is soft.     Tenderness: There is abdominal tenderness (LLQ).  Musculoskeletal:         General: No tenderness. Normal range of motion.     Cervical back: Normal range of motion and neck supple.  Skin:    General: Skin is warm and dry.  Neurological:     Mental Status: She is alert and oriented to person, place, and time.     Cranial Nerves: No cranial nerve deficit.     Deep Tendon Reflexes: Reflexes are normal and symmetric.  Psychiatric:        Behavior: Behavior normal.        Thought Content: Thought content normal.        Judgment: Judgment normal.        Assessment & Plan:   Mallory Schaad comes in today with chief complaint of No chief complaint on file.   Diagnosis and orders addressed:  1. Left lower quadrant abdominal pain Will order transvaginal US to rule out ovarian cyst Keep follow up with GI Tylenol as needed CBC pending  Work note given   Go to ED if fevers or increase in pain - US Pelvic Complete With Transvaginal; Future   Evelina Dun, FNP

## 2022-05-31 NOTE — Telephone Encounter (Signed)
Pt is asking for a work note to be excused for 05/28/2022 thru 05/31/2022. Return tomorrow. Please call when ready

## 2022-06-01 ENCOUNTER — Other Ambulatory Visit: Payer: Self-pay | Admitting: Family Medicine

## 2022-06-01 DIAGNOSIS — R109 Unspecified abdominal pain: Secondary | ICD-10-CM

## 2022-06-01 LAB — CBC WITH DIFFERENTIAL/PLATELET
Basophils Absolute: 0 10*3/uL (ref 0.0–0.2)
Basos: 0 %
EOS (ABSOLUTE): 0.2 10*3/uL (ref 0.0–0.4)
Eos: 4 %
Hematocrit: 39.1 % (ref 34.0–46.6)
Hemoglobin: 13.5 g/dL (ref 11.1–15.9)
Immature Grans (Abs): 0 10*3/uL (ref 0.0–0.1)
Immature Granulocytes: 0 %
Lymphocytes Absolute: 1.2 10*3/uL (ref 0.7–3.1)
Lymphs: 24 %
MCH: 31 pg (ref 26.6–33.0)
MCHC: 34.5 g/dL (ref 31.5–35.7)
MCV: 90 fL (ref 79–97)
Monocytes Absolute: 0.5 10*3/uL (ref 0.1–0.9)
Monocytes: 10 %
Neutrophils Absolute: 3.1 10*3/uL (ref 1.4–7.0)
Neutrophils: 62 %
Platelets: 310 10*3/uL (ref 150–450)
RBC: 4.36 x10E6/uL (ref 3.77–5.28)
RDW: 11.8 % (ref 11.7–15.4)
WBC: 5.1 10*3/uL (ref 3.4–10.8)

## 2022-06-01 MED ORDER — DICYCLOMINE HCL 10 MG PO CAPS: 10.0000 mg | ORAL_CAPSULE | Freq: Three times a day (TID) | ORAL | 1 refills | Status: DC

## 2022-06-24 ENCOUNTER — Encounter: Payer: Self-pay | Admitting: Family

## 2022-06-24 ENCOUNTER — Ambulatory Visit (INDEPENDENT_AMBULATORY_CARE_PROVIDER_SITE_OTHER): Payer: BC Managed Care – PPO | Admitting: Family

## 2022-06-24 VITALS — BP 105/62 | HR 60 | Temp 97.8°F | Ht 62.0 in | Wt 132.0 lb

## 2022-06-24 DIAGNOSIS — F902 Attention-deficit hyperactivity disorder, combined type: Secondary | ICD-10-CM | POA: Diagnosis not present

## 2022-06-24 DIAGNOSIS — Z79899 Other long term (current) drug therapy: Secondary | ICD-10-CM | POA: Diagnosis not present

## 2022-06-24 MED ORDER — LISDEXAMFETAMINE DIMESYLATE 30 MG PO CAPS: 30.0000 mg | ORAL_CAPSULE | Freq: Every day | ORAL | 0 refills | Status: DC

## 2022-06-24 NOTE — Progress Notes (Signed)
Subjective:    Patient ID: Nathalya Wolanski, female    DOB: Jan 28, 2001, 21 y.o.   MRN: [id number]  Chief Complaint  Patient presents with   ADHD    Evaluation     HPI PT presents to the office today for ADHD evaluation. Reports since high school she has had a hard time focusing in class, but never wanted to start medications. Now she is college and working and states it is interrupting her life. She states she notices she "day dreams " a lot and can not stay on task. She also reports feeling racing thoughts. She reports her current grades are B's and C's.    Review of Systems  All other systems reviewed and are negative.      Objective:   Physical Exam Vitals reviewed.  Constitutional:      General: She is not in acute distress.    Appearance: She is well-developed.  HENT:     Head: Normocephalic and atraumatic.     Right Ear: Tympanic membrane normal.     Left Ear: Tympanic membrane normal.  Eyes:     Pupils: Pupils are equal, round, and reactive to light.  Neck:     Thyroid: No thyromegaly.  Cardiovascular:     Rate and Rhythm: Normal rate and regular rhythm.     Heart sounds: Normal heart sounds. No murmur heard. Pulmonary:     Effort: Pulmonary effort is normal. No respiratory distress.     Breath sounds: Normal breath sounds. No wheezing.  Abdominal:     General: Bowel sounds are normal. There is no distension.     Palpations: Abdomen is soft.     Tenderness: There is no abdominal tenderness.  Musculoskeletal:        General: No tenderness. Normal range of motion.     Cervical back: Normal range of motion and neck supple.  Skin:    General: Skin is warm and dry.  Neurological:     Mental Status: She is alert and oriented to person, place, and time.     Cranial Nerves: No cranial nerve deficit.     Deep Tendon Reflexes: Reflexes are normal and symmetric.  Psychiatric:        Behavior: Behavior normal.        Thought Content: Thought content normal.         Judgment: Judgment normal.      BP 105/62   Pulse 60   Temp 97.8 F (36.6 C) (Temporal)   Ht 5\' 2"  (1.575 m)   Wt 132 lb (59.9 kg)   BMI 24.14 kg/m      Assessment & Plan:  Jazmen Lindenbaum comes in today with chief complaint of ADHD (Evaluation )   Diagnosis and orders addressed:  1. Attention deficit hyperactivity disorder (ADHD), combined type Start Vyvyanse 30 mg  Meds as prescribed Behavior modification as needed Positive for ASRS, will scan into chart Follow-up for recheck in 3 months Meds as prescribed Behavior modification as needed Follow-up for recheck in 3 months - lisdexamfetamine (VYVANSE) 30 MG capsule; Take 1 capsule (30 mg total) by mouth daily before breakfast.  Dispense: 30 capsule; Refill: 0 - lisdexamfetamine (VYVANSE) 30 MG capsule; Take 1 capsule (30 mg total) by mouth daily before breakfast.  Dispense: 30 capsule; Refill: 0 - lisdexamfetamine (VYVANSE) 30 MG capsule; Take 1 capsule (30 mg total) by mouth daily before breakfast.  Dispense: 30 capsule; Refill: 0  2. Controlled substance agreement signed  Evelina Dun, FNP

## 2022-06-24 NOTE — Patient Instructions (Signed)
Attention Deficit Hyperactivity Disorder, Adult Attention deficit hyperactivity disorder (ADHD) is a mental health disorder that starts during childhood. For many people with ADHD, the disorder continues into the adult years. Treatment can help you manage your symptoms. There are three main types of ADHD: Inattentive. With this type, adults have difficulty paying attention. This may affect cognitive abilities. Hyperactive-impulsive. With this type, adults have a lot of energy and have difficulty controlling their behavior. Combination type. Some people may have symptoms of both types. What are the causes? The exact cause of ADHD is not known. Most experts believe a person's genes and environment possibly contribute to ADHD. What increases the risk? The following factors may make you more likely to develop this condition: Having a first-degree relative such as a parent, brother, or sister, with the condition. Being born before 37 weeks of pregnancy (prematurely) or at a low birth weight. Being born to a mother who smoked tobacco or drank alcohol during pregnancy. Having experienced a brain injury. Being exposed to lead or other toxins in the womb or early in life. What are the signs or symptoms? Symptoms of this condition depend on the type of ADHD. Symptoms of the inattentive type include: Difficulty paying attention or following instructions. Often making simple mistakes. Being disorganized. Avoiding tasks that require time and attention. Losing and forgetting things. Symptoms of the hyperactive-impulsive type include: Restlessness. Talking out of turn, interrupting others, or talking too much. Difficulty with: Sitting still. Feeling motivated. Relaxing. Waiting in line or waiting for a turn. People with the combination type have symptoms of both of the other types. In adults, this condition may lead to certain problems, such as: Keeping jobs. Performing tasks at work. Having  stable relationships. Being on time or keeping to a schedule. How is this diagnosed? This condition is diagnosed based on your current symptoms and your history of symptoms. The diagnosis can be made by a health care provider such as a primary care provider or a mental health care specialist. Your health care provider may use a symptom checklist or a behavior rating scale to evaluate your symptoms. Your health care provider may also want to talk with people who have observed your behaviors throughout your life. How is this treated? This condition can be treated with medicines and behavior therapy. Medicines may be the best option to reduce impulsive behaviors and improve attention. Your health care provider may recommend: Stimulant medicines. These are the most common medicines used for adult ADHD. They affect certain chemicals in the brain (neurotransmitters) and improve your ability to control your symptoms. A non-stimulant medicine. These medicines can also improve focus, attention, and impulsive behavior. It may take weeks to months to see the effects of this medicine. Counseling and behavioral management are also important for treating ADHD. Counseling is often used along with medicine. Your health care provider may suggest: Cognitive behavioral therapy (CBT). This type of therapy teaches you to replace negative thoughts and actions with positive thoughts and actions. When used as part of ADHD treatment, this therapy may also include: Coping strategies for organization, time management, impulse control, and stress reduction. Mindfulness and meditation training. Behavioral management. You may work with a coach who is specially trained to help people with ADHD manage and organize activities and function more effectively. Follow these instructions at home: Medicines  Take over-the-counter and prescription medicines only as told by your health care provider. Talk with your health care provider  about the possible side effects of your medicines and   how to manage them. Alcohol use Do not drink alcohol if: Your health care provider tells you not to drink. You are pregnant, may be pregnant, or are planning to become pregnant. If you drink alcohol: Limit how much you use to: 0-1 drink a day for women. 0-2 drinks a day for men. Know how much alcohol is in your drink. In the [place] one drink equals one 12 oz bottle of beer (355 mL), one 5 oz glass of wine (148 mL), or one 1 oz glass of hard liquor (44 mL). Lifestyle  Do not use illegal drugs. Get enough sleep. Eat a healthy diet. Exercise regularly. Exercise can help to reduce stress and anxiety. General instructions Learn as much as you can about adult ADHD, and work closely with your health care providers to find the treatments that work best for you. Follow the same schedule each day. Use reminder devices like notes, calendars, and phone apps to stay on time and organized. Keep all follow-up visits. Your health care provider will need to monitor your condition and adjust your treatment over time. Where to find more information A health care provider may be able to recommend resources that are available online or over the phone. You could start with: Attention Deficit Disorder Association (ADDA): add.org National Institute of Mental Health (NIMH): nimh.nih.gov Contact a health care provider if: Your symptoms continue to cause problems. You have side effects from your medicine, such as: Repeated muscle twitches, coughing, or speech outbursts. Sleep problems. Loss of appetite. Dizziness. Unusually fast heartbeat. Stomach pains. Headaches. You are struggling with anxiety, depression, or substance abuse. Get help right away if: You have a severe reaction to a medicine. This symptom may be an emergency. Get help right away. Call 911. Do not wait to see if the symptom will go away. Do not drive yourself to the hospital. Take  one of these steps if you feel like you may hurt yourself or others, or have thoughts about taking your own life: Go to your nearest emergency room. Call 911. Call the National Suicide Prevention Lifeline at 1-800-273-8255 or 988. This is open 24 hours a day Text the Crisis Text Line at 741741. Summary ADHD is a mental health disorder that starts during childhood and often continues into your adult years. The exact cause of ADHD is not known. Most experts believe genetics and environmental factors contribute to ADHD. There is no cure for ADHD, but treatment with medicine, cognitive behavioral therapy, or behavioral management can help you manage your condition. This information is not intended to replace advice given to you by your health care provider. Make sure you discuss any questions you have with your health care provider. Document Revised: 11/13/2021 Document Reviewed: 11/13/2021 Elsevier Patient Education  2023 Elsevier Inc.  

## 2022-07-10 DIAGNOSIS — R5383 Other fatigue: Secondary | ICD-10-CM | POA: Diagnosis not present

## 2022-07-10 DIAGNOSIS — J029 Acute pharyngitis, unspecified: Secondary | ICD-10-CM | POA: Diagnosis not present

## 2022-07-16 ENCOUNTER — Telehealth: Payer: BC Managed Care – PPO | Admitting: Physician Assistant

## 2022-07-16 NOTE — Progress Notes (Signed)
The patient no-showed for appointment despite this provider sending direct link x 2 with no response and waiting for at least 10 minutes from appointment time for patient to join. They will be marked as a NS for this appointment/time.   Antonia Jicha M Lakisha Peyser, PA-C    

## 2022-07-17 ENCOUNTER — Telehealth: Payer: BC Managed Care – PPO | Admitting: Nurse Practitioner

## 2022-07-17 DIAGNOSIS — R399 Unspecified symptoms and signs involving the genitourinary system: Secondary | ICD-10-CM | POA: Diagnosis not present

## 2022-07-17 MED ORDER — FLUCONAZOLE 150 MG PO TABS: 150.0000 mg | ORAL_TABLET | Freq: Once | ORAL | 0 refills | Status: AC

## 2022-07-17 MED ORDER — NITROFURANTOIN MONOHYD MACRO 100 MG PO CAPS: 100.0000 mg | ORAL_CAPSULE | Freq: Two times a day (BID) | ORAL | 0 refills | Status: AC

## 2022-07-17 NOTE — Progress Notes (Signed)
I have spent 5 minutes in review of e-visit questionnaire, review and updating patient chart, medical decision making and response to patient.  ° °Mitchelle Goerner W Caryl Fate, NP ° °  °

## 2022-07-17 NOTE — Progress Notes (Signed)

## 2022-07-19 ENCOUNTER — Ambulatory Visit: Payer: BC Managed Care – PPO | Admitting: Family Medicine

## 2022-07-19 ENCOUNTER — Encounter: Payer: Self-pay | Admitting: Nurse Practitioner

## 2022-07-19 ENCOUNTER — Ambulatory Visit (INDEPENDENT_AMBULATORY_CARE_PROVIDER_SITE_OTHER): Payer: BC Managed Care – PPO | Admitting: Nurse Practitioner

## 2022-07-19 DIAGNOSIS — R300 Dysuria: Secondary | ICD-10-CM | POA: Diagnosis not present

## 2022-07-19 NOTE — Progress Notes (Signed)
   Virtual Visit  Note Due to COVID-19 pandemic this visit was conducted virtually. This visit type was conducted due to national recommendations for restrictions regarding the COVID-19 Pandemic (e.g. social distancing, sheltering in place) in an effort to limit this patient's exposure and mitigate transmission in our community. All issues noted in this document were discussed and addressed.  A physical exam was not performed with this format.  I connected with Kristen Mejia on 07/19/22 at 9:10 am  by telephone and verified that I am speaking with the correct person using two identifiers. Kristen Mejia is currently located at home during visit. The provider, Daryll Drown, NP is located in their office at time of visit.  I discussed the limitations, risks, security and privacy concerns of performing an evaluation and management service by telephone and the availability of in person appointments. I also discussed with the patient that there may be a patient responsible charge related to this service. The patient expressed understanding and agreed to proceed.   History and Present Illness:  Dysuria  This is a new problem. Episode onset: 2 days ago. The problem occurs every urination. The problem has been unchanged. The quality of the pain is described as burning. Associated symptoms include frequency and urgency. Pertinent negatives include no chills, flank pain or nausea. She has tried antibiotics for the symptoms. The treatment provided mild relief.      Review of Systems  Constitutional:  Negative for chills.  HENT: Negative.    Cardiovascular: Negative.   Gastrointestinal:  Negative for nausea.  Genitourinary:  Positive for dysuria, frequency and urgency. Negative for flank pain.  Skin: Negative.  Negative for itching and rash.  All other systems reviewed and are negative.    Observations/Objective: Televisit patient not in distress.  Assessment and Plan: Patient was seen via  televisit for dysuria 48 hours ago.  Patient was started on Macrobid.  She is following up today because medication is non therapeutic.  I provided education to patient that it takes a little while for medication to work.  Advised patient to increase hydration, wait for cultures to resolve, AZO over-the-counter and follow-up with unresolved symptoms. Patient verbalized understanding.   -All questions answered   Follow Up Instructions: Follow-up with unresolved symptoms.    I discussed the assessment and treatment plan with the patient. The patient was provided an opportunity to ask questions and all were answered. The patient agreed with the plan and demonstrated an understanding of the instructions.   The patient was advised to call back or seek an in-person evaluation if the symptoms worsen or if the condition fails to improve as anticipated.  The above assessment and management plan was discussed with the patient. The patient verbalized understanding of and has agreed to the management plan. Patient is aware to call the clinic if symptoms persist or worsen. Patient is aware when to return to the clinic for a follow-up visit. Patient educated on when it is appropriate to go to the emergency department.   Time call ended:  9:09 am  I provided 9 minutes of  non face-to-face time during this encounter.    Daryll Drown, NP

## 2022-07-19 NOTE — Patient Instructions (Signed)

## 2022-07-23 ENCOUNTER — Encounter: Payer: BC Managed Care – PPO | Admitting: Family

## 2022-08-03 ENCOUNTER — Encounter: Payer: Self-pay | Admitting: Family

## 2022-08-16 ENCOUNTER — Encounter: Payer: BC Managed Care – PPO | Admitting: Family

## 2022-08-17 ENCOUNTER — Encounter: Payer: Self-pay | Admitting: Family

## 2022-09-03 DIAGNOSIS — S20212A Contusion of left front wall of thorax, initial encounter: Secondary | ICD-10-CM | POA: Diagnosis not present

## 2022-09-03 DIAGNOSIS — R58 Hemorrhage, not elsewhere classified: Secondary | ICD-10-CM | POA: Diagnosis not present

## 2022-09-03 DIAGNOSIS — R000 Tachycardia, unspecified: Secondary | ICD-10-CM | POA: Diagnosis not present

## 2022-09-03 DIAGNOSIS — S0990XA Unspecified injury of head, initial encounter: Secondary | ICD-10-CM | POA: Diagnosis not present

## 2022-09-03 DIAGNOSIS — R52 Pain, unspecified: Secondary | ICD-10-CM | POA: Diagnosis not present

## 2022-09-03 DIAGNOSIS — S022XXA Fracture of nasal bones, initial encounter for closed fracture: Secondary | ICD-10-CM | POA: Diagnosis not present

## 2022-10-15 ENCOUNTER — Other Ambulatory Visit: Payer: Self-pay

## 2022-10-15 ENCOUNTER — Encounter (HOSPITAL_BASED_OUTPATIENT_CLINIC_OR_DEPARTMENT_OTHER): Payer: Self-pay | Admitting: Otolaryngology

## 2022-10-15 DIAGNOSIS — S022XXA Fracture of nasal bones, initial encounter for closed fracture: Secondary | ICD-10-CM | POA: Insufficient documentation

## 2022-10-15 DIAGNOSIS — S022XXD Fracture of nasal bones, subsequent encounter for fracture with routine healing: Secondary | ICD-10-CM | POA: Diagnosis not present

## 2022-10-17 ENCOUNTER — Encounter (HOSPITAL_BASED_OUTPATIENT_CLINIC_OR_DEPARTMENT_OTHER): Payer: Self-pay | Admitting: Otolaryngology

## 2022-10-17 NOTE — Anesthesia Preprocedure Evaluation (Signed)
Anesthesia Evaluation  Patient identified by MRN, date of birth, ID band Patient awake    Reviewed: Allergy & Precautions, NPO status , Patient's Chart, lab work & pertinent test results  Airway Mallampati: II       Dental no notable dental hx. (+) Teeth Intact   Pulmonary neg pulmonary ROS   Pulmonary exam normal breath sounds clear to auscultation       Cardiovascular negative cardio ROS Normal cardiovascular exam+ Valvular Problems/Murmurs  Rhythm:Regular Rate:Normal     Neuro/Psych  PSYCHIATRIC DISORDERS Anxiety     ADHDnegative neurological ROS     GI/Hepatic negative GI ROS,,,(+)     substance abuse  Hx/o substance abuse disorder IBS   Endo/Other  negative endocrine ROS    Renal/GU negative Renal ROS  negative genitourinary   Musculoskeletal Nasal septal Fx   Abdominal   Peds  Hematology negative hematology ROS (+)   Anesthesia Other Findings   Reproductive/Obstetrics                              Anesthesia Physical Anesthesia Plan  ASA: 2  Anesthesia Plan: General   Post-op Pain Management: Minimal or no pain anticipated   Induction: Intravenous  PONV Risk Score and Plan: 4 or greater and Treatment may vary due to age or medical condition, Midazolam and Ondansetron  Airway Management Planned: Mask, Simple Face Mask and LMA  Additional Equipment: None  Intra-op Plan:   Post-operative Plan: Extubation in OR  Informed Consent: I have reviewed the patients History and Physical, chart, labs and discussed the procedure including the risks, benefits and alternatives for the proposed anesthesia with the patient or authorized representative who has indicated his/her understanding and acceptance.     Dental advisory given  Plan Discussed with: Anesthesiologist and CRNA  Anesthesia Plan Comments:          Anesthesia Quick Evaluation

## 2022-10-18 ENCOUNTER — Encounter (HOSPITAL_BASED_OUTPATIENT_CLINIC_OR_DEPARTMENT_OTHER)
Admission: RE | Disposition: A | Discharge status: Home / Self Care | Payer: Self-pay | Source: Ambulatory Visit | Attending: Otolaryngology

## 2022-10-18 ENCOUNTER — Other Ambulatory Visit: Payer: Self-pay

## 2022-10-18 ENCOUNTER — Ambulatory Visit (HOSPITAL_BASED_OUTPATIENT_CLINIC_OR_DEPARTMENT_OTHER): Payer: BC Managed Care – PPO | Admitting: Anesthesiology

## 2022-10-18 ENCOUNTER — Ambulatory Visit (HOSPITAL_BASED_OUTPATIENT_CLINIC_OR_DEPARTMENT_OTHER)
Admission: RE | Admit: 2022-10-18 | Discharge: 2022-10-18 | Disposition: A | Discharge status: Home / Self Care | Payer: BC Managed Care – PPO | Source: Ambulatory Visit | Attending: Otolaryngology | Admitting: Otolaryngology

## 2022-10-18 ENCOUNTER — Encounter (HOSPITAL_BASED_OUTPATIENT_CLINIC_OR_DEPARTMENT_OTHER): Payer: Self-pay | Admitting: Otolaryngology

## 2022-10-18 DIAGNOSIS — S022XXA Fracture of nasal bones, initial encounter for closed fracture: Secondary | ICD-10-CM | POA: Diagnosis not present

## 2022-10-18 DIAGNOSIS — S022XXD Fracture of nasal bones, subsequent encounter for fracture with routine healing: Secondary | ICD-10-CM | POA: Diagnosis not present

## 2022-10-18 DIAGNOSIS — K589 Irritable bowel syndrome without diarrhea: Secondary | ICD-10-CM | POA: Insufficient documentation

## 2022-10-18 DIAGNOSIS — Z01818 Encounter for other preprocedural examination: Secondary | ICD-10-CM

## 2022-10-18 HISTORY — PX: CLOSED REDUCTION NASAL FRACTURE: SHX5365

## 2022-10-18 LAB — POCT PREGNANCY, URINE: Preg Test, Ur: NEGATIVE

## 2022-10-18 SURGERY — CLOSED REDUCTION, FRACTURE, NASAL BONE
Anesthesia: General | Site: Nose

## 2022-10-18 MED ORDER — MIDAZOLAM HCL 2 MG/2ML IJ SOLN
INTRAMUSCULAR | Status: AC
  Filled 2022-10-18: qty 2

## 2022-10-18 MED ORDER — FENTANYL CITRATE (PF) 100 MCG/2ML IJ SOLN
25.0000 ug | INTRAMUSCULAR | Status: DC | PRN
  Administered 2022-10-18: 50 ug via INTRAVENOUS

## 2022-10-18 MED ORDER — LIDOCAINE-EPINEPHRINE 1 %-1:100000 IJ SOLN
INTRAMUSCULAR | Status: DC | PRN
  Administered 2022-10-18: 2 mL

## 2022-10-18 MED ORDER — LIDOCAINE HCL (CARDIAC) PF 100 MG/5ML IV SOSY
PREFILLED_SYRINGE | INTRAVENOUS | Status: DC | PRN
  Administered 2022-10-18: 60 mg via INTRAVENOUS

## 2022-10-18 MED ORDER — OXYMETAZOLINE HCL 0.05 % NA SOLN
2.0000 | NASAL | Status: DC
  Administered 2022-10-18: 2 via NASAL
  Administered 2022-10-18: 1 via NASAL

## 2022-10-18 MED ORDER — LIDOCAINE 2% (20 MG/ML) 5 ML SYRINGE
INTRAMUSCULAR | Status: AC
  Filled 2022-10-18: qty 5

## 2022-10-18 MED ORDER — LACTATED RINGERS IV SOLN: INTRAVENOUS | Status: DC

## 2022-10-18 MED ORDER — DROPERIDOL 2.5 MG/ML IJ SOLN: 0.6250 mg | Freq: Once | INTRAMUSCULAR | Status: DC | PRN

## 2022-10-18 MED ORDER — DEXAMETHASONE SODIUM PHOSPHATE 10 MG/ML IJ SOLN
INTRAMUSCULAR | Status: AC
  Filled 2022-10-18: qty 1

## 2022-10-18 MED ORDER — ONDANSETRON HCL 4 MG/2ML IJ SOLN
INTRAMUSCULAR | Status: AC
  Filled 2022-10-18: qty 2

## 2022-10-18 MED ORDER — FENTANYL CITRATE (PF) 100 MCG/2ML IJ SOLN
INTRAMUSCULAR | Status: AC
  Filled 2022-10-18: qty 2

## 2022-10-18 MED ORDER — FENTANYL CITRATE (PF) 100 MCG/2ML IJ SOLN
INTRAMUSCULAR | Status: DC | PRN
  Administered 2022-10-18 (×2): 50 ug via INTRAVENOUS

## 2022-10-18 MED ORDER — PROPOFOL 10 MG/ML IV BOLUS
INTRAVENOUS | Status: DC | PRN
  Administered 2022-10-18: 200 mg via INTRAVENOUS

## 2022-10-18 MED ORDER — OXYCODONE HCL 5 MG PO TABS
ORAL_TABLET | ORAL | Status: AC
  Filled 2022-10-18: qty 1

## 2022-10-18 MED ORDER — OXYCODONE HCL 5 MG PO TABS
5.0000 mg | ORAL_TABLET | Freq: Once | ORAL | Status: AC | PRN
  Administered 2022-10-18: 5 mg via ORAL

## 2022-10-18 MED ORDER — OXYMETAZOLINE HCL 0.05 % NA SOLN
NASAL | Status: AC
  Filled 2022-10-18: qty 30

## 2022-10-18 MED ORDER — ONDANSETRON HCL 4 MG/2ML IJ SOLN
INTRAMUSCULAR | Status: DC | PRN
  Administered 2022-10-18: 4 mg via INTRAVENOUS

## 2022-10-18 MED ORDER — OXYCODONE HCL 5 MG/5ML PO SOLN: 5.0000 mg | Freq: Once | ORAL | Status: AC | PRN

## 2022-10-18 MED ORDER — OXYMETAZOLINE HCL 0.05 % NA SOLN
NASAL | Status: DC | PRN
  Administered 2022-10-18: 1 via TOPICAL

## 2022-10-18 MED ORDER — DEXAMETHASONE SODIUM PHOSPHATE 10 MG/ML IJ SOLN
INTRAMUSCULAR | Status: DC | PRN
  Administered 2022-10-18: 10 mg via INTRAVENOUS

## 2022-10-18 MED ORDER — ONDANSETRON HCL 4 MG/2ML IJ SOLN: 4.0000 mg | Freq: Once | INTRAMUSCULAR | Status: DC | PRN

## 2022-10-18 MED ORDER — MIDAZOLAM HCL 5 MG/5ML IJ SOLN
INTRAMUSCULAR | Status: DC | PRN
  Administered 2022-10-18: 2 mg via INTRAVENOUS

## 2022-10-18 SURGICAL SUPPLY — 32 items
APL SKNCLS STERI-STRIP NONHPOA (GAUZE/BANDAGES/DRESSINGS) ×1
BENZOIN TINCTURE PRP APPL 2/3 (GAUZE/BANDAGES/DRESSINGS) ×1 IMPLANT
CANISTER SUCT 1200ML W/VALVE (MISCELLANEOUS) ×1 IMPLANT
CNTNR URN SCR LID CUP LEK RST (MISCELLANEOUS) IMPLANT
CONT SPEC 4OZ STRL OR WHT (MISCELLANEOUS) ×1
COVER BACK TABLE 60X90IN (DRAPES) ×1 IMPLANT
DRSG CURAD 3X16 NADH (PACKING) IMPLANT
DRSG NASAL KENNEDY LMNT 8CM (GAUZE/BANDAGES/DRESSINGS) IMPLANT
DRSG NASOPORE 8CM (GAUZE/BANDAGES/DRESSINGS) IMPLANT
DRSG TELFA 3X8 NADH STRL (GAUZE/BANDAGES/DRESSINGS) IMPLANT
GAUZE PACKING IODOFORM 1/2INX (GAUZE/BANDAGES/DRESSINGS) IMPLANT
GAUZE SPONGE 2X2 STRL 8-PLY (GAUZE/BANDAGES/DRESSINGS) ×1 IMPLANT
GAUZE SPONGE 4X4 12PLY STRL LF (GAUZE/BANDAGES/DRESSINGS) ×1 IMPLANT
GLOVE ECLIPSE 7.5 STRL STRAW (GLOVE) ×1 IMPLANT
GOWN STRL REUS W/ TWL LRG LVL3 (GOWN DISPOSABLE) ×1 IMPLANT
GOWN STRL REUS W/TWL LRG LVL3 (GOWN DISPOSABLE)
MARKER SKIN DUAL TIP RULER LAB (MISCELLANEOUS) IMPLANT
NDL HYPO 27GX1-1/4 (NEEDLE) ×1 IMPLANT
NEEDLE HYPO 27GX1-1/4 (NEEDLE) ×1 IMPLANT
PATTIES SURGICAL .5 X3 (DISPOSABLE) ×1 IMPLANT
SHEET MEDIUM DRAPE 40X70 STRL (DRAPES) ×1 IMPLANT
SHEET SILICONE 2X3 0.03 REINF (MISCELLANEOUS) IMPLANT
SPIKE FLUID TRANSFER (MISCELLANEOUS) IMPLANT
SPLINT NASAL DENVER LRG BLUSH (MISCELLANEOUS) ×1 IMPLANT
SPLINT NASAL DENVER SM/MD BLUS (MISCELLANEOUS) IMPLANT
SPONGE SURGIFOAM ABS GEL 12-7 (HEMOSTASIS) IMPLANT
STRIP CLOSURE SKIN 1/4X4 (GAUZE/BANDAGES/DRESSINGS) ×1 IMPLANT
SUT ETHILON 3 0 PS 1 (SUTURE) IMPLANT
SYR CONTROL 10ML LL (SYRINGE) ×1 IMPLANT
TOWEL GREEN STERILE FF (TOWEL DISPOSABLE) ×1 IMPLANT
TUBE CONNECTING 20X1/4 (TUBING) ×1 IMPLANT
YANKAUER SUCT BULB TIP NO VENT (SUCTIONS) IMPLANT
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
## 2022-10-18 NOTE — Anesthesia Postprocedure Evaluation (Signed)
Anesthesia Post Note  Patient: Kristen Mejia  Procedure(s) Performed: CLOSED REDUCTION NASAL FRACTURE (Nose)     Patient location during evaluation: PACU Anesthesia Type: General Level of consciousness: awake and alert and oriented Pain management: pain level controlled Vital Signs Assessment: post-procedure vital signs reviewed and stable Respiratory status: spontaneous breathing, nonlabored ventilation and respiratory function stable Cardiovascular status: blood pressure returned to baseline and stable Postop Assessment: no apparent nausea or vomiting Anesthetic complications: no   No notable events documented.  Last Vitals:  Vitals:   10/18/22 1153 10/18/22 1200  BP: 136/80 131/75  Pulse: 97 100  Resp: 19 16  Temp: 36.8 C   SpO2: 100% 99%    Last Pain:  Vitals:   10/18/22 1211  TempSrc:   PainSc: 5                  Kristen Mejia A.

## 2022-10-18 NOTE — Op Note (Signed)
OPERATIVE REPORT  DATE OF SURGERY: 10/18/2022  PATIENT:  Kristen Mejia,  22 y.o. female  PRE-OPERATIVE DIAGNOSIS:  NASAL FRACTURE  POST-OPERATIVE DIAGNOSIS:  NASAL FRACTURE  PROCEDURE:  Procedure(s): CLOSED REDUCTION NASAL FRACTURE  SURGEON:  Beckie Salts, MD  ASSISTANTS: None  ANESTHESIA:   General   EBL: Less than 20 ml  DRAINS: None  LOCAL MEDICATIONS USED: 1% Xylocaine with epinephrine  SPECIMEN:  none  COUNTS:  Correct  PROCEDURE DETAILS: The patient was taken to the operating room and placed on the operating table in the supine position. Following induction of general endotracheal anesthesia, using laryngeal mask airway, the nose was prepped and draped in standard fashion.  Oxymetazoline spray was used preoperatively in the nasal cavities.  1% Xylocaine with epinephrine was infiltrated into the left side nasal septum and the mucosal and skin side of the nasal bone.  Afrin pledgets were placed bilaterally for several minutes.  A butter knife nasal elevator was used to elevate the left nasal bone with simultaneous digital pressure on the right and was able to achieve movement and what appeared to be anatomic reduction.  The bones were stable in place.  There is minimal bleeding.    PATIENT DISPOSITION:  To PACU, stable

## 2022-10-18 NOTE — Discharge Instructions (Addendum)
Keep the nose dry until the splint falls off.  Once the splint falls off you may pull off the adhesive strips if they come off easily.  Avoid any accidental trauma to the nose for 6 weeks.  Okay to use Tylenol/Motrin if needed.   Post Anesthesia Home Care Instructions  Activity: Get plenty of rest for the remainder of the day. A responsible individual must stay with you for 24 hours following the procedure.  For the next 24 hours, DO NOT: -Drive a car -Paediatric nurse -Drink alcoholic beverages -Take any medication unless instructed by your physician -Make any legal decisions or sign important papers.  Meals: Start with liquid foods such as gelatin or soup. Progress to regular foods as tolerated. Avoid greasy, spicy, heavy foods. If nausea and/or vomiting occur, drink only clear liquids until the nausea and/or vomiting subsides. Call your physician if vomiting continues.  Special Instructions/Symptoms: Your throat may feel dry or sore from the anesthesia or the breathing tube placed in your throat during surgery. If this causes discomfort, gargle with warm salt water. The discomfort should disappear within 24 hours.  If you had a scopolamine patch placed behind your ear for the management of post- operative nausea and/or vomiting:  1. The medication in the patch is effective for 72 hours, after which it should be removed.  Wrap patch in a tissue and discard in the trash. Wash hands thoroughly with soap and water. 2. You may remove the patch earlier than 72 hours if you experience unpleasant side effects which may include dry mouth, dizziness or visual disturbances. 3. Avoid touching the patch. Wash your hands with soap and water after contact with the patch.

## 2022-10-18 NOTE — Anesthesia Procedure Notes (Signed)
Procedure Name: LMA Insertion Date/Time: 10/18/2022 11:24 AM  Performed by: Lowella Dell, CRNAPre-anesthesia Checklist: Patient identified, Emergency Drugs available, Suction available and Patient being monitored Patient Re-evaluated:Patient Re-evaluated prior to induction Oxygen Delivery Method: Circle System Utilized Preoxygenation: Pre-oxygenation with 100% oxygen Induction Type: IV induction Ventilation: Mask ventilation without difficulty LMA: LMA inserted LMA Size: 4.0 Number of attempts: 1 Airway Equipment and Method: Bite block Placement Confirmation: positive ETCO2 Tube secured with: Tape Dental Injury: Teeth and Oropharynx as per pre-operative assessment

## 2022-10-18 NOTE — Transfer of Care (Signed)
Immediate Anesthesia Transfer of Care Note  Patient: Kristen Mejia  Procedure(s) Performed: CLOSED REDUCTION NASAL FRACTURE (Nose)  Patient Location: PACU  Anesthesia Type:General  Level of Consciousness: awake, alert , and oriented  Airway & Oxygen Therapy: Patient Spontanous Breathing and Patient connected to face mask oxygen  Post-op Assessment: Report given to RN and Post -op Vital signs reviewed and stable  Post vital signs: Reviewed and stable  Last Vitals:  Vitals Value Taken Time  BP 136/80 10/18/22 1153  Temp    Pulse 104 10/18/22 1155  Resp 19 10/18/22 1155  SpO2 100 % 10/18/22 1155  Vitals shown include unvalidated device data.  Last Pain:  Vitals:   10/18/22 0957  TempSrc: Oral  PainSc: 0-No pain         Complications: No notable events documented.

## 2022-10-18 NOTE — Interval H&P Note (Signed)
History and Physical Interval Note:  10/18/2022 10:58 AM  Kristen Mejia  has presented today for surgery, with the diagnosis of NASAL FRACTURE.  The various methods of treatment have been discussed with the patient and family. After consideration of risks, benefits and other options for treatment, the patient has consented to  Procedure(s): CLOSED REDUCTION NASAL FRACTURE (N/A) as a surgical intervention.  The patient's history has been reviewed, patient examined, no change in status, stable for surgery.  I have reviewed the patient's chart and labs.  Questions were answered to the patient's satisfaction.     [name]

## 2022-10-19 ENCOUNTER — Encounter (HOSPITAL_BASED_OUTPATIENT_CLINIC_OR_DEPARTMENT_OTHER): Payer: Self-pay | Admitting: Otolaryngology

## 2022-11-02 DIAGNOSIS — S022XXD Fracture of nasal bones, subsequent encounter for fracture with routine healing: Secondary | ICD-10-CM | POA: Diagnosis not present

## 2022-11-06 ENCOUNTER — Emergency Department (HOSPITAL_COMMUNITY): Payer: BC Managed Care – PPO

## 2022-11-06 ENCOUNTER — Other Ambulatory Visit: Payer: Self-pay

## 2022-11-06 ENCOUNTER — Emergency Department (HOSPITAL_COMMUNITY)
Admission: EM | Admit: 2022-11-06 | Discharge: 2022-11-06 | Disposition: A | Discharge status: Home / Self Care | Payer: BC Managed Care – PPO | Attending: Emergency Medicine | Admitting: Emergency Medicine

## 2022-11-06 ENCOUNTER — Encounter (HOSPITAL_COMMUNITY): Payer: Self-pay

## 2022-11-06 DIAGNOSIS — K2921 Alcoholic gastritis with bleeding: Secondary | ICD-10-CM | POA: Diagnosis not present

## 2022-11-06 DIAGNOSIS — R109 Unspecified abdominal pain: Secondary | ICD-10-CM | POA: Diagnosis not present

## 2022-11-06 DIAGNOSIS — Z0389 Encounter for observation for other suspected diseases and conditions ruled out: Secondary | ICD-10-CM | POA: Diagnosis not present

## 2022-11-06 DIAGNOSIS — K2920 Alcoholic gastritis without bleeding: Secondary | ICD-10-CM | POA: Diagnosis not present

## 2022-11-06 DIAGNOSIS — R1110 Vomiting, unspecified: Secondary | ICD-10-CM | POA: Diagnosis not present

## 2022-11-06 DIAGNOSIS — R000 Tachycardia, unspecified: Secondary | ICD-10-CM | POA: Diagnosis not present

## 2022-11-06 LAB — URINALYSIS, ROUTINE W REFLEX MICROSCOPIC
Bilirubin Urine: NEGATIVE
Glucose, UA: NEGATIVE mg/dL
Ketones, ur: 5 mg/dL — AB
Leukocytes,Ua: NEGATIVE
Nitrite: NEGATIVE
Protein, ur: 100 mg/dL — AB
Specific Gravity, Urine: 1.035 — ABNORMAL HIGH (ref 1.005–1.030)
pH: 5 (ref 5.0–8.0)

## 2022-11-06 LAB — CBC WITH DIFFERENTIAL/PLATELET
Abs Immature Granulocytes: 0.02 10*3/uL (ref 0.00–0.07)
Basophils Absolute: 0 10*3/uL (ref 0.0–0.1)
Basophils Relative: 0 %
Eosinophils Absolute: 0 10*3/uL (ref 0.0–0.5)
Eosinophils Relative: 0 %
HCT: 44.5 % (ref 36.0–46.0)
Hemoglobin: 15.2 g/dL — ABNORMAL HIGH (ref 12.0–15.0)
Immature Granulocytes: 0 %
Lymphocytes Relative: 4 %
Lymphs Abs: 0.4 10*3/uL — ABNORMAL LOW (ref 0.7–4.0)
MCH: 29.7 pg (ref 26.0–34.0)
MCHC: 34.2 g/dL (ref 30.0–36.0)
MCV: 87.1 fL (ref 80.0–100.0)
Monocytes Absolute: 0.5 10*3/uL (ref 0.1–1.0)
Monocytes Relative: 5 %
Neutro Abs: 9.4 10*3/uL — ABNORMAL HIGH (ref 1.7–7.7)
Neutrophils Relative %: 91 %
Platelets: 409 10*3/uL — ABNORMAL HIGH (ref 150–400)
RBC: 5.11 MIL/uL (ref 3.87–5.11)
RDW: 12.7 % (ref 11.5–15.5)
WBC: 10.4 10*3/uL (ref 4.0–10.5)
nRBC: 0 % (ref 0.0–0.2)

## 2022-11-06 LAB — COMPREHENSIVE METABOLIC PANEL
ALT: 30 U/L (ref 0–44)
AST: 30 U/L (ref 15–41)
Albumin: 4.6 g/dL (ref 3.5–5.0)
Alkaline Phosphatase: 112 U/L (ref 38–126)
Anion gap: 13 (ref 5–15)
BUN: 16 mg/dL (ref 6–20)
CO2: 20 mmol/L — ABNORMAL LOW (ref 22–32)
Calcium: 9.5 mg/dL (ref 8.9–10.3)
Chloride: 103 mmol/L (ref 98–111)
Creatinine, Ser: 0.6 mg/dL (ref 0.44–1.00)
GFR, Estimated: >60 mL/min (ref >60)
Glucose, Bld: 119 mg/dL — ABNORMAL HIGH (ref 70–99)
Potassium: 3.7 mmol/L (ref 3.5–5.1)
Sodium: 136 mmol/L (ref 135–145)
Total Bilirubin: 2.3 mg/dL — ABNORMAL HIGH (ref 0.3–1.2)
Total Protein: 8.8 g/dL — ABNORMAL HIGH (ref 6.5–8.1)

## 2022-11-06 LAB — HCG, QUANTITATIVE, PREGNANCY: hCG, Beta Chain, Quant, S: <1 m[IU]/mL (ref <5)

## 2022-11-06 LAB — POC URINE PREG, ED: Preg Test, Ur: NEGATIVE

## 2022-11-06 LAB — TYPE AND SCREEN
ABO/RH(D): B POS
Antibody Screen: NEGATIVE

## 2022-11-06 LAB — LIPASE, BLOOD: Lipase: 28 U/L (ref 11–51)

## 2022-11-06 LAB — LACTIC ACID, PLASMA
Lactic Acid, Venous: 2.3 mmol/L (ref 0.5–1.9)
Lactic Acid, Venous: 1.7 mmol/L (ref 0.5–1.9)

## 2022-11-06 MED ORDER — LACTATED RINGERS IV BOLUS
1000.0000 mL | Freq: Once | INTRAVENOUS | Status: AC
  Administered 2022-11-06: 1000 mL via INTRAVENOUS

## 2022-11-06 MED ORDER — LORAZEPAM 2 MG/ML IJ SOLN
1.0000 mg | Freq: Once | INTRAMUSCULAR | Status: AC
  Administered 2022-11-06: 1 mg via INTRAVENOUS
  Filled 2022-11-06: qty 1

## 2022-11-06 MED ORDER — IOHEXOL 300 MG/ML  SOLN
100.0000 mL | Freq: Once | INTRAMUSCULAR | Status: AC | PRN
  Administered 2022-11-06: 100 mL via INTRAVENOUS

## 2022-11-06 MED ORDER — ONDANSETRON HCL 4 MG/2ML IJ SOLN
4.0000 mg | Freq: Once | INTRAMUSCULAR | Status: AC
  Administered 2022-11-06: 4 mg via INTRAVENOUS
  Filled 2022-11-06: qty 2

## 2022-11-06 MED ORDER — ONDANSETRON HCL 4 MG PO TABS: 4.0000 mg | ORAL_TABLET | Freq: Four times a day (QID) | ORAL | 0 refills | Status: DC

## 2022-11-06 MED ORDER — PANTOPRAZOLE SODIUM 20 MG PO TBEC: 20.0000 mg | DELAYED_RELEASE_TABLET | Freq: Every day | ORAL | 2 refills | Status: DC

## 2022-11-06 MED ORDER — PANTOPRAZOLE SODIUM 40 MG IV SOLR
40.0000 mg | Freq: Once | INTRAVENOUS | Status: AC
  Administered 2022-11-06: 40 mg via INTRAVENOUS
  Filled 2022-11-06: qty 10

## 2022-11-06 NOTE — ED Notes (Signed)
POC urine pred NEGATIVE

## 2022-11-06 NOTE — ED Notes (Signed)
Pt resting well.  No tremors noted
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
## 2022-11-06 NOTE — ED Triage Notes (Signed)
Patient arrives with complaints of vomiting since this morning. Patient states she had breakfast and soon after that she started having black emesis. Patient states she has pain on the left lower quadrant of abdomen.

## 2022-11-06 NOTE — ED Notes (Signed)
Pt states she feels so much better.  No nausea and pain relieved

## 2022-11-06 NOTE — ED Notes (Signed)
Pt has noted tremors of arms and hands.  Pt does admit to drinking a lot of alcohol for spring break in [place]. Pt amb to BR with no difficulty.  HR increased up to 138 and goes back down to 100.s when sitting.   Noted ativan has been ordered

## 2022-11-06 NOTE — ED Provider Notes (Signed)
[facility] Provider Note   CSN: [id number] Arrival date & time: 11/06/22  1927     History {Add pertinent medical, surgical, social history, OB history to HPI:1} Chief Complaint  Patient presents with   Emesis    Kristen Mejia is a 22 y.o. female.   Emesis    This is a 22 year old female with history of ADHD, GAD, IBS presents to the emergency department due to hematemesis and melena.  Symptoms started today after eating breakfast at [employer], started having emesis with straight black vomit.  She vomited 10-15 times all with block emesis.  Following the emesis started having epigastric and left lower quadrant abdominal pain which is worse with any vomiting, she also had a dark stool movement without any diarrhea.  No previous abdominal surgeries, she is not on blood thinners, has never needed a blood transfusion.  Denies any anti-inflammatory use but last 3 to 5 weeks has been drinking between 4-5 mixed alcoholic beverages daily.  No history of ulcers.  Patient did take pepto after vomiting before the dark stool.   Home Medications Prior to Admission medications   Medication Sig Start Date End Date Taking? Authorizing Provider  acetaminophen (TYLENOL) 500 MG tablet Take 500 mg by mouth every 6 (six) hours as needed for mild pain.   Yes [provider]  anti-nausea (EMETROL) solution Take 10 mLs by mouth once.   Yes [provider]  ondansetron (ZOFRAN) 4 MG tablet Take 1 tablet (4 mg total) by mouth every 6 (six) hours. 11/06/22  Yes Sherrill Raring, PA-C  pantoprazole (PROTONIX) 20 MG tablet Take 1 tablet (20 mg total) by mouth daily. 11/06/22  Yes Sherrill Raring, PA-C      Allergies    Patient has no known allergies.    Review of Systems   Review of Systems  Gastrointestinal:  Positive for vomiting.    Physical Exam Updated Vital Signs BP 129/69   Pulse (!) 112   Temp 98.3 F (36.8 C) (Oral)   Resp 20   Ht 5\' 5"   (1.651 m)   Wt 65 kg   LMP 09/28/2022 (Exact Date) Comment: DOS UPREG NEGATIVE  SpO2 100%   BMI 23.85 kg/m  Physical Exam Vitals and nursing note reviewed. Exam conducted with a chaperone present.  Constitutional:      Appearance: Normal appearance.  HENT:     Head: Normocephalic and atraumatic.  Eyes:     General: No scleral icterus.       Right eye: No discharge.        Left eye: No discharge.     Extraocular Movements: Extraocular movements intact.     Pupils: Pupils are equal, round, and reactive to light.  Cardiovascular:     Rate and Rhythm: Regular rhythm. Tachycardia present.     Pulses: Normal pulses.     Heart sounds: Normal heart sounds.     No friction rub. No gallop.  Pulmonary:     Effort: Pulmonary effort is normal. No respiratory distress.     Breath sounds: Normal breath sounds.  Abdominal:     General: Abdomen is flat. Bowel sounds are normal. There is no distension.     Palpations: Abdomen is soft.     Tenderness: There is abdominal tenderness.     Comments: Epigastric tenderness, left lower quadrant tenderness.  No rebound tenderness or guarding.  Genitourinary:    Comments: Rectal exam performed with chaperone RN Belenda Cruise in room.  Guaiac negative, no melanotic stool or active hemorrhaging from the rectum.  1 small external hemorrhoid 6:00. Skin:    General: Skin is warm and dry.     Coloration: Skin is not jaundiced.  Neurological:     Mental Status: She is alert. Mental status is at baseline.     Coordination: Coordination normal.     ED Results / Procedures / Treatments   Labs (all labs ordered are listed, but only abnormal results are displayed) Labs Reviewed  COMPREHENSIVE METABOLIC PANEL - Abnormal; Notable for the following components:      Result Value   CO2 20 (*)    Glucose, Bld 119 (*)    Total Protein 8.8 (*)    Total Bilirubin 2.3 (*)    All other components within normal limits  URINALYSIS, ROUTINE W REFLEX MICROSCOPIC -  Abnormal; Notable for the following components:   APPearance HAZY (*)    Specific Gravity, Urine 1.035 (*)    Hgb urine dipstick SMALL (*)    Ketones, ur 5 (*)    Protein, ur 100 (*)    Bacteria, UA RARE (*)    All other components within normal limits  CBC WITH DIFFERENTIAL/PLATELET - Abnormal; Notable for the following components:   Hemoglobin 15.2 (*)    Platelets 409 (*)    Neutro Abs 9.4 (*)    Lymphs Abs 0.4 (*)    All other components within normal limits  LACTIC ACID, PLASMA - Abnormal; Notable for the following components:   Lactic Acid, Venous 2.3 (*)    All other components within normal limits  LIPASE, BLOOD  HCG, QUANTITATIVE, PREGNANCY  LACTIC ACID, PLASMA  POC OCCULT BLOOD, ED  POC URINE PREG, ED  TYPE AND SCREEN    EKG EKG Interpretation  Date/Time:  Saturday November 06 2022 19:44:45 EDT Ventricular Rate:  135 PR Interval:  94 QRS Duration: 72 QT Interval:  278 QTC Calculation: 417 R Axis:   106 Text Interpretation: *** Suspect arm lead reversal, interpretation assumes no reversal Sinus tachycardia with short PR Rightward axis Borderline ECG When compared with ECG of 06-Nov-2022 19:44, No significant change was found Confirmed by Wynona Dove ([id number]) on 11/06/2022 8:19:32 PM  Radiology CT ABDOMEN PELVIS W CONTRAST  Result Date: 11/06/2022 CLINICAL DATA:  Left lower quadrant pain EXAM: CT ABDOMEN AND PELVIS WITH CONTRAST TECHNIQUE: Multidetector CT imaging of the abdomen and pelvis was performed using the standard protocol following bolus administration of intravenous contrast. RADIATION DOSE REDUCTION: This exam was performed according to the departmental dose-optimization program which includes automated exposure control, adjustment of the mA and/or kV according to patient size and/or use of iterative reconstruction technique. CONTRAST:  164mL OMNIPAQUE IOHEXOL 300 MG/ML  SOLN COMPARISON:  04/09/2022 FINDINGS: Lower chest: No acute abnormality Hepatobiliary: No  focal hepatic abnormality. Gallbladder unremarkable. Pancreas: No focal abnormality or ductal dilatation. Spleen: No focal abnormality.  Normal size. Adrenals/Urinary Tract: No adrenal abnormality. No focal renal abnormality. No stones or hydronephrosis. Urinary bladder is unremarkable. Stomach/Bowel: Stomach, large and small bowel grossly unremarkable. Appendix not definitively seen. No pericecal inflammation. Vascular/Lymphatic: No evidence of aneurysm or adenopathy. Reproductive: Uterus and adnexa unremarkable.  No mass. Other: Trace free fluid in the cul-de-sac.  No free air. Musculoskeletal: No acute bony abnormality. IMPRESSION: No acute findings in the abdomen or pelvis. Electronically Signed   By: Rolm Baptise M.D.   On: 11/06/2022 21:28   DG Chest Portable 1 View  Result Date: 11/06/2022 CLINICAL DATA:  Question  free air EXAM: PORTABLE CHEST 1 VIEW COMPARISON:  04/11/2013 FINDINGS: Heart and mediastinal shadows are normal. The lungs are clear. No pneumothorax or hemothorax. No free air seen under the diaphragm. No abnormal bone finding. IMPRESSION: No active disease. No pneumothorax or evidence of intraperitoneal air on this single image. Electronically Signed   By: Nelson Chimes M.D.   On: 11/06/2022 20:20    Procedures Procedures  {Document cardiac monitor, telemetry assessment procedure when appropriate:1}  Medications Ordered in ED Medications  lactated ringers bolus 1,000 mL (0 mLs Intravenous Stopped 11/06/22 2158)  pantoprazole (PROTONIX) injection 40 mg (40 mg Intravenous Given 11/06/22 2016)  ondansetron (ZOFRAN) injection 4 mg (4 mg Intravenous Given 11/06/22 2015)  LORazepam (ATIVAN) injection 1 mg (1 mg Intravenous Given 11/06/22 2101)  iohexol (OMNIPAQUE) 300 MG/ML solution 100 mL (100 mLs Intravenous Contrast Given 11/06/22 2113)  lactated ringers bolus 1,000 mL (1,000 mLs Intravenous New Bag/Given 11/06/22 2104)  LORazepam (ATIVAN) injection 1 mg (1 mg Intravenous Given 11/06/22  2242)    ED Course/ Medical Decision Making/ A&P Clinical Course as of 11/06/22 2251  Sat Nov 06, 2022  2037 POC occult blood, ED Negative  [HS]    Clinical Course User Index [HS] Sherrill Raring, PA-C   {   Click here for ABCD2, HEART and other calculatorsREFRESH Note before signing :1}                          Medical Decision Making Amount and/or Complexity of Data Reviewed Labs: ordered. Decision-making details documented in ED Course. Radiology: ordered.  Risk Prescription drug management.   Patient presents to the emergency department due to abdominal pain, melena hematemesis.  Differential includes GI bleed, symptomatic anemia, diverticulitis, perforated gastric ulcer, gastritis, ovarian cyst, ovarian torsion, diverticulitis, mesenteric ischemia, ectopic pregnancy.   Patient is significantly tachycardic on exam but blood pressure is stable and not hypotensive.  Fecal occult was negative for blood, will treat with antiemetics, PPIs, fluid rehydration.  Will start with labs, CT abdomen pelvis.  I do not really have a high suspicion for mesenteric ischemia based on age and lack of risk factors.    X-ray was obtained to evaluate for perforated ulcer or free air, this was negative.  CT abdomen is negative for acute intra-abdominal process, decreased radiologist.  Patient's abdominal pain is resolved, I do not really think presentation consistent with an ovarian cyst or torsion.  Especially in the setting of possible hematemesis.  Laboratory workup is without any anemia, no gross electrolyte derangement or AKI.  Patient's lactic was initially elevated 2.4.  Likely secondary to dehydration, second liter bolus ordered.  Patient is very well-appearing on reevaluation, she is still mildly tachycardic but heart rate significantly improved compared to initial vitals.  Repeat lactic shows resolution of lactic acidosis, 1.7 compared to 2.4.  I do think that the darker stool was most likely  from the Pepto-Bismol, the darker emesis could have been blood-streaked secondary to alcohol induced gastritis.  He did removed in the emergency department for multiple hours without any active emesis or dry heaving, unable to visualize any of the emesis but questioning if it is actually hematemesis.  It definitely was not coffee-ground, but could been darker colored emesis.  Lower suspicion for an ulcer at this time but I will have her on antiacid medicine, follow-up closely with GI for reevaluation.  Given patient's vitals have improved, pain is improved and there is been no repeat of  bleeding I do not think any additional evaluation is needed emergently.   {Document critical care time when appropriate:1} {Document review of labs and clinical decision tools ie heart score, Chads2Vasc2 etc:1}  {Document your independent review of radiology images, and any outside records:1} {Document your discussion with family members, caretakers, and with consultants:1} {Document social determinants of health affecting pt's care:1} {Document your decision making why or why not admission, treatments were needed:1} Final Clinical Impression(s) / ED Diagnoses Final diagnoses:  Acute alcoholic gastritis with hemorrhage    Rx / DC Orders ED Discharge Orders          Ordered    pantoprazole (PROTONIX) 20 MG tablet  Daily        11/06/22 2240    ondansetron (ZOFRAN) 4 MG tablet  Every 6 hours        11/06/22 2250

## 2022-11-06 NOTE — ED Notes (Signed)
Pt's HR running from 101-120's,  Dr Pearline Cables notified.  Pt DC to home. Instructed to drink lots of water. And no alsohol

## 2022-11-08 LAB — POC OCCULT BLOOD, ED: Fecal Occult Bld: NEGATIVE

## 2022-12-20 ENCOUNTER — Telehealth: Payer: Self-pay | Admitting: Family

## 2022-12-20 NOTE — Telephone Encounter (Signed)
Apppt made patient aware

## 2022-12-21 ENCOUNTER — Ambulatory Visit: Payer: BC Managed Care – PPO | Admitting: Family

## 2022-12-22 ENCOUNTER — Encounter: Payer: Self-pay | Admitting: Family

## 2023-01-10 ENCOUNTER — Encounter: Payer: Self-pay | Admitting: Nurse Practitioner

## 2023-01-10 ENCOUNTER — Ambulatory Visit (INDEPENDENT_AMBULATORY_CARE_PROVIDER_SITE_OTHER): Payer: BC Managed Care – PPO | Admitting: Nurse Practitioner

## 2023-01-10 VITALS — BP 126/83 | HR 91 | Temp 97.8°F | Ht 65.0 in | Wt 156.2 lb

## 2023-01-10 DIAGNOSIS — Z3009 Encounter for other general counseling and advice on contraception: Secondary | ICD-10-CM | POA: Diagnosis not present

## 2023-01-10 DIAGNOSIS — Z30011 Encounter for initial prescription of contraceptive pills: Secondary | ICD-10-CM | POA: Insufficient documentation

## 2023-01-10 DIAGNOSIS — R6889 Other general symptoms and signs: Secondary | ICD-10-CM | POA: Diagnosis not present

## 2023-01-10 DIAGNOSIS — N926 Irregular menstruation, unspecified: Secondary | ICD-10-CM | POA: Diagnosis not present

## 2023-01-10 LAB — PREGNANCY, URINE: Preg Test, Ur: NEGATIVE

## 2023-01-10 MED ORDER — NORGESTIM-ETH ESTRAD TRIPHASIC 0.18/0.215/0.25 MG-25 MCG PO TABS: 1.0000 | ORAL_TABLET | Freq: Every day | ORAL | 1 refills | Status: DC

## 2023-01-10 MED ORDER — NORGESTIM-ETH ESTRAD TRIPHASIC 0.18/0.215/0.25 MG-25 MCG PO TABS: 1.0000 | ORAL_TABLET | Freq: Every day | ORAL | 11 refills | Status: DC

## 2023-01-10 NOTE — Progress Notes (Signed)
Acute Office Visit  Subjective:     Patient ID: Kristen Mejia, female    DOB: 2000-12-25, 22 y.o.   MRN: [id number]  Chief Complaint  Patient presents with   Menstrual Problem    Last period 12/20/22 ended 12/25/22 then started again 01/09/23    HPI Kristen Mejia 22 year old female seen today as an acute visit due to concern of irregular cycle. "last cycle for 13-14, did not have in May.  My Cycle started yesterday and I am concern because since the first time that happen".  Client reports menarche at 22 years old and has been having regular cycles until April of this year.  She denies the possibility that she was pregnant.  She reports experiencing a lot of cramping with this cycle advised on taking Advil for the cramping is heat for relief.  hCG today is negative.  She reports doing with increased stress due to school and work, and eating healthy diet most of fruits and vegetables. She is sexually active and currently not on contraceptive.  "I was on p.o. contraceptive a few years ago and I did not like the way it made me feel so I stopped taking it" today we discussed possible restarting contraceptives she was given the option of Nexplanon IUD or the patch.  She decided to do restart p.o. contraceptive and she does not like it the next option will be the patch. We discussed checking CBC, TSH She denied the use of tobacco, EtOH, illicit drugs  She is due for her first PAP  LMP 01/09/2023, HCG negative  ROS Negative unless indicated in HPI    Objective:    BP 126/83   Pulse 91   Temp 97.8 F (36.6 C) (Temporal)   Ht 5\' 5"  (1.651 m)   Wt 156 lb 3.2 oz (70.9 kg)   LMP 01/09/2023   SpO2 98%   BMI 25.99 kg/m  BP Readings from Last 3 Encounters:  01/10/23 126/83  11/06/22 110/77  10/18/22 (!) 115/94   Wt Readings from Last 3 Encounters:  01/10/23 156 lb 3.2 oz (70.9 kg)  11/06/22 143 lb 4.8 oz (65 kg)  10/18/22 144 lb 13.5 oz (65.7 kg)      Physical Exam Vitals and nursing  note reviewed.  Constitutional:      General: She is not in acute distress.    Appearance: Normal appearance.  HENT:     Head: Normocephalic and atraumatic.  Eyes:     General: No scleral icterus.    Extraocular Movements: Extraocular movements intact.     Conjunctiva/sclera: Conjunctivae normal.     Pupils: Pupils are equal, round, and reactive to light.  Cardiovascular:     Rate and Rhythm: Normal rate.     Heart sounds: Normal heart sounds.  Pulmonary:     Effort: Pulmonary effort is normal.     Breath sounds: Normal breath sounds.  Abdominal:     General: Bowel sounds are normal.     Palpations: Abdomen is soft.  Musculoskeletal:        General: Normal range of motion.     Right lower leg: No edema.     Left lower leg: No edema.  Neurological:     General: No focal deficit present.     Mental Status: She is alert and oriented to person, place, and time. Mental status is at baseline.  Psychiatric:        Mood and Affect: Mood normal.  Behavior: Behavior normal.        Thought Content: Thought content normal.        Judgment: Judgment normal.     Results for orders placed or performed in visit on 01/10/23  Pregnancy, urine  Result Value Ref Range   Preg Test, Ur Negative Negative        Assessment & Plan:  Irregular periods -     Pregnancy, urine -     CBC with Differential/Platelet -     Thyroid Panel With TSH -     Norgestim-Eth Estrad Triphasic; Take 1 tablet by mouth daily.  Dispense: 28 tablet; Refill: 1  Counseling for birth control, oral contraceptives -     Norgestim-Eth Estrad Triphasic; Take 1 tablet by mouth daily.  Dispense: 28 tablet; Refill: 1  OCP (oral contraceptive pills) initiation -     Norgestim-Eth Estrad Triphasic; Take 1 tablet by mouth daily.  Dispense: 28 tablet; Refill: 1  Irregular cycle Start contraceptive Ortho Negative Hcg Labs ordered: CBC, TSH Client was counseled on oral contraceptive If not agreeable with PO  contraceptive, she will try the patch will try the Patch next  The above assessment and management plan was discussed with the patient. The patient verbalized understanding of and has agreed to the management plan. Patient is aware to call the clinic if they develop any new symptoms or if symptoms fail to improve or worsen. Patient is aware when to return to the clinic for a follow-up visit.  follow-up with PCP in 62-months for contraceptive evaluation   [address] [place] Lighter DNP

## 2023-01-11 ENCOUNTER — Telehealth: Payer: Self-pay | Admitting: Family

## 2023-01-11 ENCOUNTER — Other Ambulatory Visit: Payer: Self-pay | Admitting: Nurse Practitioner

## 2023-01-11 DIAGNOSIS — R946 Abnormal results of thyroid function studies: Secondary | ICD-10-CM

## 2023-01-11 DIAGNOSIS — N926 Irregular menstruation, unspecified: Secondary | ICD-10-CM

## 2023-01-11 LAB — THYROID PANEL WITH TSH
Free Thyroxine Index: 5.5 — ABNORMAL HIGH (ref 1.2–4.9)
T3 Uptake Ratio: 42 % — ABNORMAL HIGH (ref 24–39)
T4, Total: 13.2 ug/dL — ABNORMAL HIGH (ref 4.5–12.0)
TSH: <0.005 u[IU]/mL — ABNORMAL LOW (ref 0.450–4.500)

## 2023-01-11 LAB — CBC WITH DIFFERENTIAL/PLATELET
Basophils Absolute: 0 10*3/uL (ref 0.0–0.2)
Basos: 0 %
EOS (ABSOLUTE): 0.3 10*3/uL (ref 0.0–0.4)
Eos: 5 %
Hematocrit: 37.7 % (ref 34.0–46.6)
Hemoglobin: 12.5 g/dL (ref 11.1–15.9)
Immature Grans (Abs): 0 10*3/uL (ref 0.0–0.1)
Immature Granulocytes: 0 %
Lymphocytes Absolute: 1.7 10*3/uL (ref 0.7–3.1)
Lymphs: 36 %
MCH: 28.5 pg (ref 26.6–33.0)
MCHC: 33.2 g/dL (ref 31.5–35.7)
MCV: 86 fL (ref 79–97)
Monocytes Absolute: 0.5 10*3/uL (ref 0.1–0.9)
Monocytes: 10 %
Neutrophils Absolute: 2.3 10*3/uL (ref 1.4–7.0)
Neutrophils: 49 %
Platelets: 304 10*3/uL (ref 150–450)
RBC: 4.39 x10E6/uL (ref 3.77–5.28)
RDW: 13.2 % (ref 11.7–15.4)
WBC: 4.7 10*3/uL (ref 3.4–10.8)

## 2023-01-11 NOTE — Telephone Encounter (Signed)
Email

## 2023-01-14 ENCOUNTER — Other Ambulatory Visit: Payer: Self-pay | Admitting: Family

## 2023-01-14 ENCOUNTER — Telehealth: Payer: Self-pay | Admitting: Family

## 2023-01-14 MED ORDER — ONDANSETRON HCL 4 MG PO TABS: 4.0000 mg | ORAL_TABLET | Freq: Three times a day (TID) | ORAL | 0 refills | Status: DC | PRN

## 2023-01-14 NOTE — Telephone Encounter (Signed)
Can not give thyroid medication because thyroid levels is hyper not hypo. Needs to see specialists. Zofran Prescription sent to pharmacy for nausea.

## 2023-01-24 ENCOUNTER — Telehealth (INDEPENDENT_AMBULATORY_CARE_PROVIDER_SITE_OTHER): Payer: BC Managed Care – PPO | Admitting: Family Medicine

## 2023-01-24 ENCOUNTER — Encounter: Payer: Self-pay | Admitting: Family Medicine

## 2023-01-24 DIAGNOSIS — E069 Thyroiditis, unspecified: Secondary | ICD-10-CM | POA: Diagnosis not present

## 2023-01-24 MED ORDER — PREDNISONE 20 MG PO TABS: ORAL_TABLET | ORAL | 0 refills | Status: DC

## 2023-01-24 NOTE — Progress Notes (Signed)
Virtual Visit via MyChart video note  I connected with Kristen Mejia on 01/24/23 at 1631 by video and verified that I am speaking with the correct person using two identifiers. Kristen Mejia is currently located at car and patient are currently with her during visit. The provider, Elige Radon Langley Flatley, MD is located in their office at time of visit.  Call ended at 1643  I discussed the limitations, risks, security and privacy concerns of performing an evaluation and management service by video and the availability of in person appointments. I also discussed with the patient that there may be a patient responsible charge related to this service. The patient expressed understanding and agreed to proceed.   History and Present Illness: Patient is calling in for sore throat and swelling. She has no cough or fever or runny nose.  She just started this morning with the pain and swelling. She took ibuprofen but it did not help. She was recently diagnosed with hyperthyroidism. She denies sOB or chest pains.   1. Thyroiditis     Outpatient Encounter Medications as of 01/24/2023  Medication Sig   ondansetron (ZOFRAN) 4 MG tablet Take 1 tablet (4 mg total) by mouth every 8 (eight) hours as needed for nausea or vomiting.   predniSONE (DELTASONE) 20 MG tablet 2 po at same time daily for 5 days   Norgestimate-Ethinyl Estradiol Triphasic (ORTHO TRI-CYCLEN LO) 0.18/0.215/0.25 MG-25 MCG tab Take 1 tablet by mouth daily.   No facility-administered encounter medications on file as of 01/24/2023.    Review of Systems  Constitutional:  Negative for chills and fever.  HENT:  Positive for sore throat. Negative for congestion, ear discharge and ear pain.   Eyes:  Negative for redness and visual disturbance.  Respiratory:  Negative for cough, chest tightness, shortness of breath and wheezing.   Cardiovascular:  Negative for chest pain and leg swelling.  Genitourinary:  Negative for difficulty urinating and  dysuria.  Musculoskeletal:  Negative for back pain and gait problem.  Skin:  Negative for rash.  Neurological:  Negative for light-headedness and headaches.  Psychiatric/Behavioral:  Negative for agitation and behavioral problems.   All other systems reviewed and are negative.   Observations/Objective: Patient sounds comfortable and in no acute distress  Assessment and Plan: Problem List Items Addressed This Visit   None Visit Diagnoses     Thyroiditis    -  Primary   Relevant Medications   predniSONE (DELTASONE) 20 MG tablet       Will give short course of prednisone, with recently acutely elevated thyroid levels, likely thyroiditis and she has follow-up in the near future, she should follow-up with [place] in the near future and her endocrinologist.  The soonest she can get endocrinology with August but she is trying to get on the cancellation list to get on sooner. Follow up plan: Return if symptoms worsen or fail to improve.     I discussed the assessment and treatment plan with the patient. The patient was provided an opportunity to ask questions and all were answered. The patient agreed with the plan and demonstrated an understanding of the instructions.   The patient was advised to call back or seek an in-person evaluation if the symptoms worsen or if the condition fails to improve as anticipated.  The above assessment and management plan was discussed with the patient. The patient verbalized understanding of and has agreed to the management plan. Patient is aware to call the clinic if symptoms persist or  worsen. Patient is aware when to return to the clinic for a follow-up visit. Patient educated on when it is appropriate to go to the emergency department.    I provided 12 minutes of non-face-to-face time during this encounter.    Nils Pyle, MD

## 2023-01-26 ENCOUNTER — Encounter: Payer: Self-pay | Admitting: Family Medicine

## 2023-02-01 ENCOUNTER — Ambulatory Visit: Payer: BC Managed Care – PPO | Admitting: Nurse Practitioner

## 2023-02-02 ENCOUNTER — Telehealth (INDEPENDENT_AMBULATORY_CARE_PROVIDER_SITE_OTHER): Payer: BC Managed Care – PPO | Admitting: Nurse Practitioner

## 2023-02-02 ENCOUNTER — Encounter: Payer: Self-pay | Admitting: Nurse Practitioner

## 2023-02-02 DIAGNOSIS — E069 Thyroiditis, unspecified: Secondary | ICD-10-CM

## 2023-02-02 DIAGNOSIS — R112 Nausea with vomiting, unspecified: Secondary | ICD-10-CM | POA: Insufficient documentation
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
## 2023-02-03 ENCOUNTER — Telehealth: Payer: BC Managed Care – PPO | Admitting: Physician Assistant

## 2023-02-03 DIAGNOSIS — E0590 Thyrotoxicosis, unspecified without thyrotoxic crisis or storm: Secondary | ICD-10-CM

## 2023-02-03 DIAGNOSIS — R110 Nausea: Secondary | ICD-10-CM | POA: Diagnosis not present

## 2023-02-03 MED ORDER — SUCRALFATE 1 G PO TABS: 1.0000 g | ORAL_TABLET | Freq: Three times a day (TID) | ORAL | 0 refills | Status: DC
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
## 2023-02-03 NOTE — Patient Instructions (Signed)
Kristen Mejia, thank you for joining [name], PA-C for today's virtual visit.  While this provider is not your primary care provider (PCP), if your PCP is located in our provider database this encounter information will be shared with them immediately following your visit.   A [facility] MyChart account gives you access to today's visit and all your visits, tests, and labs performed at [facility] " click here if you don't have a Kristen Mejia MyChart account or go to [email or web address]  Consent: (Patient) Kristen Mejia provided verbal consent for this virtual visit at the beginning of the encounter.  Current Medications:  Current Outpatient Medications:    ondansetron (ZOFRAN) 4 MG tablet, Take 1 tablet (4 mg total) by mouth every 8 (eight) hours as needed for nausea or vomiting., Disp: 20 tablet, Rfl: 0   sucralfate (CARAFATE) 1 g tablet, Take 1 tablet (1 g total) by mouth 4 (four) times daily -  with meals and at bedtime., Disp: 40 tablet, Rfl: 0   Norgestimate-Ethinyl Estradiol Triphasic (ORTHO TRI-CYCLEN LO) 0.18/0.215/0.25 MG-25 MCG tab, Take 1 tablet by mouth daily., Disp: 28 tablet, Rfl: 1   predniSONE (DELTASONE) 20 MG tablet, 2 po at same time daily for 5 days, Disp: 10 tablet, Rfl: 0   Medications ordered in this encounter:  Meds ordered this encounter  Medications   sucralfate (CARAFATE) 1 g tablet    Sig: Take 1 tablet (1 g total) by mouth 4 (four) times daily -  with meals and at bedtime.    Dispense:  40 tablet    Refill:  0    Order Specific Question:   Supervising Provider    Answer:   Merrilee Jansky [id number]     *If you need refills on other medications prior to your next appointment, please contact your pharmacy*  Follow-Up: Call back or seek an in-person evaluation if the symptoms worsen or if the condition fails to improve as anticipated.   Virtual Care [phone]  Other  Instructions Hyperthyroidism Hyperthyroidism refers to a thyroid gland that is too active or overactive. The thyroid gland is a small gland located in the lower front part of the neck, just in front of the windpipe (trachea). This gland makes hormones that: Help control how the body uses food for energy (metabolism). Help the heart and brain work well. Keep your bones strong. When the thyroid is overactive, it produces too much of a hormone called thyroxine. What are the causes? This condition may be caused by: Graves' disease. This is a disorder in which the body's disease-fighting system (immune system) attacks the thyroid gland. This is the most common cause. Inflammation of the thyroid gland. A tumor in the thyroid gland. Use of certain medicines, including: Prescription thyroid hormone replacement. Herbal supplements that mimic thyroid hormones. Amiodarone therapy. Solid or fluid-filled lumps within your thyroid gland (thyroid nodules). Taking in a large amount of iodine from foods or medicines. What increases the risk? You are more likely to develop this condition if: You are female. You have a family history of thyroid conditions. You smoke tobacco. You use a medicine called lithium. You take medicines that affect the immune system (immunosuppressants). What are the signs or symptoms? Symptoms of this condition include: Nervousness. Inability to tolerate heat. Diarrhea. Rapid heart rate. Shaky hands. Restlessness. Sleep problems. Other symptoms may include: Heart skipping beats or making extra beats. Unexplained weight loss. Change in the texture of hair or skin. Loss of menstruation. Fatigue. Enlarged  thyroid gland or a lump in the thyroid (nodule). You may also have symptoms of Graves' disease, which may include: Protruding eyes. Dry eyes. Red or swollen eyes. Problems with vision. How is this diagnosed? This condition may be diagnosed based on: Your symptoms  and medical history. A physical exam. Blood tests. Thyroid ultrasound. This test involves using sound waves to produce images of the thyroid gland. A thyroid scan. A radioactive substance is injected into a vein, and images show how much iodine is present in the thyroid. Radioactive iodine uptake test (RAIU). A small amount of radioactive iodine is given by mouth to see how much iodine the thyroid absorbs after a certain amount of time. How is this treated? Treatment depends on the cause and severity of the condition. Treatment may include: Medicines to reduce the amount of thyroid hormone your body makes. Medicines to help manage your symptoms. Radioactive iodine treatment (radioiodine therapy). This involves swallowing a small dose of radioactive iodine, in capsule or liquid form, to kill thyroid cells. Surgery to remove part or all of your thyroid gland. You may need to take thyroid hormone replacement medicine for the rest of your life after thyroid surgery. Follow these instructions at home:  Take over-the-counter and prescription medicines only as told by your health care provider. Do not use any products that contain nicotine or tobacco. These products include cigarettes, chewing tobacco, and vaping devices, such as e-cigarettes. If you need help quitting, ask your health care provider. Follow any instructions from your health care provider about diet. You may be instructed to limit foods that contain iodine. Keep all follow-up visits. You will need to have blood tests regularly so that your health care provider can monitor your condition. Where to find more information [employer] of Diabetes and Digestive and Kidney Diseases: [email or web address] Contact a health care provider if: Your symptoms do not get better with treatment. You have a fever. You have abdominal pain. You feel dizzy. You are taking thyroid hormone replacement medicine and: You have symptoms of depression. You  feel like you are tired all the time. You gain weight. Get help right away if: You have sudden, unexplained confusion or other mental changes. You have chest pain. You have fast or irregular heartbeats (palpitations). You have difficulty breathing. These symptoms may be an emergency. Get help right away. Call 911. Do not wait to see if the symptoms will go away. Do not drive yourself to the hospital. Summary The thyroid gland is a small gland located in the lower front part of the neck, just in front of the windpipe. Hyperthyroidism is when the thyroid gland is too active and produces too much of a hormone called thyroxine. The most common cause is Graves' disease, a disorder in which your immune system attacks the thyroid gland. Hyperthyroidism can cause various symptoms, such as unexplained weight loss, nervousness, inability to tolerate heat, or changes in your heartbeat. Treatment may include medicine to reduce the amount of thyroid hormone your body makes, radioiodine therapy, surgery, or medicines to manage symptoms. This information is not intended to replace advice given to you by your health care provider. Make sure you discuss any questions you have with your health care provider. Document Revised: 09/18/2021 Document Reviewed: 09/18/2021 Elsevier Patient Education  2024 Elsevier Inc.    If you have been instructed to have an in-person evaluation today at a local Urgent Care facility, please use the link below. It will take you to a list of all of  our available [facility], including address, phone number and hours of operation. Please do not delay care.  [facility]  If you or a family member do not have a primary care provider, use the link below to schedule a visit and establish care. When you choose a [facility] primary care physician or advanced practice provider, you gain a long-term partner in health. Find a Primary Care Provider  Learn more  about [facility]'s in-office and virtual care options: [facility] - Get Care Now

## 2023-02-05 ENCOUNTER — Telehealth: Payer: BC Managed Care – PPO

## 2023-02-07 ENCOUNTER — Encounter: Payer: BC Managed Care – PPO | Admitting: Family Medicine

## 2023-02-07 ENCOUNTER — Ambulatory Visit (INDEPENDENT_AMBULATORY_CARE_PROVIDER_SITE_OTHER): Payer: BC Managed Care – PPO | Admitting: Nurse Practitioner

## 2023-02-07 ENCOUNTER — Telehealth: Payer: BC Managed Care – PPO | Admitting: Physician Assistant

## 2023-02-07 ENCOUNTER — Telehealth: Payer: Self-pay | Admitting: Family

## 2023-02-07 ENCOUNTER — Encounter: Payer: Self-pay | Admitting: Nurse Practitioner

## 2023-02-07 VITALS — BP 128/87 | HR 73 | Temp 97.4°F | Ht 65.0 in | Wt 158.8 lb

## 2023-02-07 DIAGNOSIS — L299 Pruritus, unspecified: Secondary | ICD-10-CM

## 2023-02-07 DIAGNOSIS — R112 Nausea with vomiting, unspecified: Secondary | ICD-10-CM

## 2023-02-07 DIAGNOSIS — K2100 Gastro-esophageal reflux disease with esophagitis, without bleeding: Secondary | ICD-10-CM

## 2023-02-07 DIAGNOSIS — R509 Fever, unspecified: Secondary | ICD-10-CM

## 2023-02-07 DIAGNOSIS — S80869A Insect bite (nonvenomous), unspecified lower leg, initial encounter: Secondary | ICD-10-CM

## 2023-02-07 MED ORDER — OMEPRAZOLE 20 MG PO CPDR: 20.0000 mg | DELAYED_RELEASE_CAPSULE | Freq: Every day | ORAL | 1 refills | Status: DC

## 2023-02-07 NOTE — Progress Notes (Signed)
Acute Office Visit  Subjective:     Patient ID: Haze Madan, female    DOB: [birth year], 22 y.o.   MRN: [id number]  Chief Complaint  Patient presents with   Nausea   Vomiting    Patient was seen on 6/26 and states its a little better.  Always after eating     HPI Daira Haun 22 year old female lysed last seen by this provider on video visit on 6 26 for nausea and vomiting, and was prescribed Zofran as needed.  During that visit while client requested work excuse note for 7 days which this provider did not feel like he was appropriate.  She was kept out of work for 3 days. She had a video visit at 8 AM this morning with PA [facility] for continued nausea vomiting, bug bites on lower leg possible fleabites and fever since Saturday and reports that the area was seen this morning with itchiness and she has been using calamine lotion with minimal relief.  She reports vomiting 2 times this morning after eating breakfast and take Zofran with minimal relief and was advised for an inpatient visit  Client came to the clinic complaining of nausea and vomiting and sour taste in her mouth never mentioned itchiness of her lower leg.  Once again she requested to be off of work for the next 3 days which this provider does not feel that is appropriate "I do a lot of heavy lifting at work and feel like working" explained to client that she will get excuse note for today and needs to go back to work in the morning.  If she needs to be off work for a longer period of time she needs to follow-up with the PCP to discuss possible FMLA. She reports that she has been drinking a lot of orange juice and when lying down she has been experiencing sour taste in the mouth at night so we discussed starting her on omeprazole and to continue Zofran as needed for nausea/vomiting   ROS Negative unless indicated in HPI    Objective:    BP 128/87   Pulse 73   Temp (!) 97.4 F (36.3 C) (Temporal)   Ht 5\' 5"  (1.651 m)   Wt  158 lb 12.8 oz (72 kg)   LMP 01/17/2023   SpO2 97%   BMI 26.43 kg/m  BP Readings from Last 3 Encounters:  02/07/23 128/87  01/10/23 126/83  11/06/22 110/77   Wt Readings from Last 3 Encounters:  02/07/23 158 lb 12.8 oz (72 kg)  01/10/23 156 lb 3.2 oz (70.9 kg)  11/06/22 143 lb 4.8 oz (65 kg)      Physical Exam Vitals and nursing note reviewed.  Constitutional:      Appearance: Normal appearance. She is not ill-appearing.  Cardiovascular:     Rate and Rhythm: Normal rate and regular rhythm.  Pulmonary:     Effort: Pulmonary effort is normal.     Breath sounds: Normal breath sounds.  Abdominal:     General: Bowel sounds are normal.     Palpations: Abdomen is soft. There is no mass.     Tenderness: There is no abdominal tenderness.  Musculoskeletal:        General: Normal range of motion.     Right lower leg: No edema.     Left lower leg: No edema.  Skin:    General: Skin is warm and dry.     Findings: No rash.  Neurological:  General: No focal deficit present.     Mental Status: She is alert and oriented to person, place, and time. Mental status is at baseline.  Psychiatric:        Mood and Affect: Mood normal.        Behavior: Behavior normal.        Thought Content: Thought content normal.        Judgment: Judgment normal.     No results found for any visits on 02/07/23.      Assessment & Plan:  Gastroesophageal reflux disease with esophagitis without hemorrhage -     Omeprazole; Take 1 capsule (20 mg total) by mouth daily.  Dispense: 30 capsule; Refill: 23  Well 22 year old female in no acute distress stress New diagnosis of GERD start omeprazole 1 capsule daily Continue Zofran as needed for nausea and vomiting and follow-up with PCP for possible FMLA paperwork. She has not upcoming appointment with endocrine for her thyroid Of care discussed with client all questions answered Excuse note for for work for today provided to client  Return in about 2  weeks (around 02/21/2023) for with her PCP [facility].  [name], DNP [facility] [address] [place], [place] [address] [phone]

## 2023-02-07 NOTE — Progress Notes (Signed)
Duplicate Visit- seen this morning [place]

## 2023-02-07 NOTE — Patient Instructions (Signed)
  Kristen Mejia, thank you for joining Kristen Loveless, PA-C for today's virtual visit.  While this provider is not your primary care provider (PCP), if your PCP is located in our provider database this encounter information will be shared with them immediately following your visit.   A [facility] MyChart account gives you access to today's visit and all your visits, tests, and labs performed at [facility] " click here if you don't have a [facility] MyChart account or go to [email or web address]  Consent: (Patient) Kristen Mejia provided verbal consent for this virtual visit at the beginning of the encounter.  Current Medications:  Current Outpatient Medications:    ondansetron (ZOFRAN) 4 MG tablet, Take 1 tablet (4 mg total) by mouth every 8 (eight) hours as needed for nausea or vomiting., Disp: 20 tablet, Rfl: 0   Norgestimate-Ethinyl Estradiol Triphasic (ORTHO TRI-CYCLEN LO) 0.18/0.215/0.25 MG-25 MCG tab, Take 1 tablet by mouth daily., Disp: 28 tablet, Rfl: 1   predniSONE (DELTASONE) 20 MG tablet, 2 po at same time daily for 5 days, Disp: 10 tablet, Rfl: 0   sucralfate (CARAFATE) 1 g tablet, Take 1 tablet (1 g total) by mouth 4 (four) times daily -  with meals and at bedtime., Disp: 40 tablet, Rfl: 0   Medications ordered in this encounter:  No orders of the defined types were placed in this encounter.    *If you need refills on other medications prior to your next appointment, please contact your pharmacy*  Follow-Up: Call back or seek an in-person evaluation if the symptoms worsen or if the condition fails to improve as anticipated.  [facility] [phone]   If you have been instructed to have an in-person evaluation today at a local Urgent Care facility, please use the link below. It will take you to a list of all of our available [facility], including address, phone number and hours of operation. Please do not delay care.  [facility]  If you or a family member do not have a primary care provider, use the link below to schedule a visit and establish care. When you choose a [facility] primary care physician or advanced practice provider, you gain a long-term partner in health. Find a Primary Care Provider  Learn more about [facility]'s in-office and virtual care options: [facility] - Get Care Now

## 2023-02-07 NOTE — Telephone Encounter (Signed)
Patient understands. Patient asking if if you will write a letter for her to stay out of work till Friday she asked at todays visit for provider for the note that provider told her to call back and ask for it from pcp. Please advise.

## 2023-02-07 NOTE — Telephone Encounter (Signed)
Please schedule visit. Pt needs to see Endocrinologists. Can we check on this referral?\

## 2023-02-07 NOTE — Progress Notes (Signed)
Virtual Visit Consent   [name], you are scheduled for a virtual visit with a [facility] provider today. Just as with appointments in the office, your consent must be obtained to participate. Your consent will be active for this visit and any virtual visit you may have with one of our providers in the next 365 days. If you have a MyChart account, a copy of this consent can be sent to you electronically.  As this is a virtual visit, video technology does not allow for your provider to perform a traditional examination. This may limit your provider's ability to fully assess your condition. If your provider identifies any concerns that need to be evaluated in person or the need to arrange testing (such as labs, EKG, etc.), we will make arrangements to do so. Although advances in technology are sophisticated, we cannot ensure that it will always work on either your end or our end. If the connection with a video visit is poor, the visit may have to be switched to a telephone visit. With either a video or telephone visit, we are not always able to ensure that we have a secure connection.  By engaging in this virtual visit, you consent to the provision of healthcare and authorize for your insurance to be billed (if applicable) for the services provided during this visit. Depending on your insurance coverage, you may receive a charge related to this service.  I need to obtain your verbal consent now. Are you willing to proceed with your visit today? Linsdey Casel has provided verbal consent on 02/07/2023 for a virtual visit (video or telephone). [name], PA-C  Date: 02/07/2023 7:59 AM  Virtual Visit via Video Note   I, [name], connected with  Kristen Mejia  ([id number], [birth year]) on 02/07/23 at  7:45 AM EDT by a video-enabled telemedicine application and verified that I am speaking with the correct person using two identifiers.  Location: Patient: Virtual Visit Location Patient:  Home Provider: Virtual Visit Location Provider: Home Office   I discussed the limitations of evaluation and management by telemedicine and the availability of in person appointments. The patient expressed understanding and agreed to proceed.    History of Present Illness: Kristen Mejia is a 22 y.o. who identifies as a female who was assigned female at birth, and is being seen today for continued nausea, vomiting, bug bites on lower legs (possible flea bites), and fever. Stayed at a friend's house on Saturday. Noticed a few bites on lower legs then. Worsened yesterday. Itching present. Has applied calamine lotion without relief. Did have an episode of nausea and vomiting x 2 this morning after eating an egg sandwich. Took Zofran and that helped some. Reports subjective fever now.   Problems:  Patient Active Problem List   Diagnosis Date Noted   Nausea and vomiting 02/02/2023   Thyroiditis 02/02/2023   Counseling for birth control, oral contraceptives 01/10/2023   OCP (oral contraceptive pills) initiation 01/10/2023   Nasal fracture 10/15/2022   Attention deficit hyperactivity disorder (ADHD), combined type 06/24/2022   Controlled substance agreement signed 06/24/2022   Irregular periods 06/10/2021   Irritable bowel syndrome with both constipation and diarrhea 10/20/2018   GAD (generalized anxiety disorder) 04/25/2018    Allergies: No Known Allergies Medications:  Current Outpatient Medications:    ondansetron (ZOFRAN) 4 MG tablet, Take 1 tablet (4 mg total) by mouth every 8 (eight) hours as needed for nausea or vomiting., Disp: 20 tablet, Rfl: 0   Norgestimate-Ethinyl  Estradiol Triphasic (ORTHO TRI-CYCLEN LO) 0.18/0.215/0.25 MG-25 MCG tab, Take 1 tablet by mouth daily., Disp: 28 tablet, Rfl: 1   predniSONE (DELTASONE) 20 MG tablet, 2 po at same time daily for 5 days, Disp: 10 tablet, Rfl: 0   sucralfate (CARAFATE) 1 g tablet, Take 1 tablet (1 g total) by mouth 4 (four) times daily -  with  meals and at bedtime., Disp: 40 tablet, Rfl: 0  Observations/Objective: Patient is well-developed, well-nourished in no acute distress.  Resting comfortably at home.  Head is normocephalic, atraumatic.  No labored breathing.  Speech is clear and coherent with logical content.  Patient is alert and oriented at baseline.  Small papular lesions noted with scabbing overlying but covered in calamine lotion so hard to discern any signs of infection  Assessment and Plan: 1. Itching  2. Flea bite of lower leg, unspecified laterality, initial encounter  3. Fever, unspecified fever cause  4. Nausea and vomiting, unspecified vomiting type  - Recurrent nausea and vomiting now with feverish feeling, malaise, and bug bites. - Advised in person UC  Follow Up Instructions: I discussed the assessment and treatment plan with the patient. The patient was provided an opportunity to ask questions and all were answered. The patient agreed with the plan and demonstrated an understanding of the instructions.  A copy of instructions were sent to the patient via MyChart unless otherwise noted below.    The patient was advised to call back or seek an in-person evaluation if the symptoms worsen or if the condition fails to improve as anticipated.  Time:  I spent 13 minutes with the patient via telehealth technology discussing the above problems/concerns.    [name], PA-C

## 2023-02-08 NOTE — Telephone Encounter (Signed)
Work note sent to MyChart.   Milah Recht, FNP  

## 2023-02-17 NOTE — Telephone Encounter (Signed)
Patient calling back to check on work note.

## 2023-02-18 ENCOUNTER — Ambulatory Visit (INDEPENDENT_AMBULATORY_CARE_PROVIDER_SITE_OTHER): Payer: BC Managed Care – PPO | Admitting: Nurse Practitioner

## 2023-02-18 ENCOUNTER — Encounter: Payer: Self-pay | Admitting: Nurse Practitioner

## 2023-02-18 VITALS — BP 129/86 | HR 66 | Ht 65.0 in | Wt 155.2 lb

## 2023-02-18 DIAGNOSIS — E0590 Thyrotoxicosis, unspecified without thyrotoxic crisis or storm: Secondary | ICD-10-CM | POA: Diagnosis not present
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
## 2023-02-18 NOTE — Patient Instructions (Signed)
Hyperthyroidism Hyperthyroidism refers to a thyroid gland that is too active or overactive. The thyroid gland is a small gland located in the lower front part of the neck, just in front of the windpipe (trachea). This gland makes hormones that: Help control how the body uses food for energy (metabolism). Help the heart and brain work well. Keep your bones strong. When the thyroid is overactive, it produces too much of a hormone called thyroxine. What are the causes? This condition may be caused by: Graves' disease. This is a disorder in which the body's disease-fighting system (immune system) attacks the thyroid gland. This is the most common cause. Inflammation of the thyroid gland. A tumor in the thyroid gland. Use of certain medicines, including: Prescription thyroid hormone replacement. Herbal supplements that mimic thyroid hormones. Amiodarone therapy. Solid or fluid-filled lumps within your thyroid gland (thyroid nodules). Taking in a large amount of iodine from foods or medicines. What increases the risk? You are more likely to develop this condition if: You are female. You have a family history of thyroid conditions. You smoke tobacco. You use a medicine called lithium. You take medicines that affect the immune system (immunosuppressants). What are the signs or symptoms? Symptoms of this condition include: Nervousness. Inability to tolerate heat. Diarrhea. Rapid heart rate. Shaky hands. Restlessness. Sleep problems. Other symptoms may include: Heart skipping beats or making extra beats. Unexplained weight loss. Change in the texture of hair or skin. Loss of menstruation. Fatigue. Enlarged thyroid gland or a lump in the thyroid (nodule). You may also have symptoms of Graves' disease, which may include: Protruding eyes. Dry eyes. Red or swollen eyes. Problems with vision. How is this diagnosed? This condition may be diagnosed based on: Your symptoms and medical  history. A physical exam. Blood tests. Thyroid ultrasound. This test involves using sound waves to produce images of the thyroid gland. A thyroid scan. A radioactive substance is injected into a vein, and images show how much iodine is present in the thyroid. Radioactive iodine uptake test (RAIU). A small amount of radioactive iodine is given by mouth to see how much iodine the thyroid absorbs after a certain amount of time. How is this treated? Treatment depends on the cause and severity of the condition. Treatment may include: Medicines to reduce the amount of thyroid hormone your body makes. Medicines to help manage your symptoms. Radioactive iodine treatment (radioiodine therapy). This involves swallowing a small dose of radioactive iodine, in capsule or liquid form, to kill thyroid cells. Surgery to remove part or all of your thyroid gland. You may need to take thyroid hormone replacement medicine for the rest of your life after thyroid surgery. Follow these instructions at home:  Take over-the-counter and prescription medicines only as told by your health care provider. Do not use any products that contain nicotine or tobacco. These products include cigarettes, chewing tobacco, and vaping devices, such as e-cigarettes. If you need help quitting, ask your health care provider. Follow any instructions from your health care provider about diet. You may be instructed to limit foods that contain iodine. Keep all follow-up visits. You will need to have blood tests regularly so that your health care provider can monitor your condition. Where to find more information National Institute of Diabetes and Digestive and Kidney Diseases: niddk.nih.gov Contact a health care provider if: Your symptoms do not get better with treatment. You have a fever. You have abdominal pain. You feel dizzy. You are taking thyroid hormone replacement medicine and: You have   symptoms of depression. You feel like you  are tired all the time. You gain weight. Get help right away if: You have sudden, unexplained confusion or other mental changes. You have chest pain. You have fast or irregular heartbeats (palpitations). You have difficulty breathing. These symptoms may be an emergency. Get help right away. Call 911. Do not wait to see if the symptoms will go away. Do not drive yourself to the hospital. Summary The thyroid gland is a small gland located in the lower front part of the neck, just in front of the windpipe. Hyperthyroidism is when the thyroid gland is too active and produces too much of a hormone called thyroxine. The most common cause is Graves' disease, a disorder in which your immune system attacks the thyroid gland. Hyperthyroidism can cause various symptoms, such as unexplained weight loss, nervousness, inability to tolerate heat, or changes in your heartbeat. Treatment may include medicine to reduce the amount of thyroid hormone your body makes, radioiodine therapy, surgery, or medicines to manage symptoms. This information is not intended to replace advice given to you by your health care provider. Make sure you discuss any questions you have with your health care provider. Document Revised: 09/18/2021 Document Reviewed: 09/18/2021 Elsevier Patient Education  2024 Elsevier Inc.  

## 2023-02-19 LAB — T3, FREE: T3, Free: 5.6 pg/mL — ABNORMAL HIGH (ref 2.0–4.4)

## 2023-02-19 LAB — T4, FREE: Free T4: 2.17 ng/dL — ABNORMAL HIGH (ref 0.82–1.77)

## 2023-02-19 LAB — THYROGLOBULIN ANTIBODY: Thyroglobulin Antibody: 8 IU/mL — ABNORMAL HIGH (ref 0.0–0.9)

## 2023-02-19 LAB — THYROID PEROXIDASE ANTIBODY: Thyroperoxidase Ab SerPl-aCnc: 167 IU/mL — ABNORMAL HIGH (ref 0–34)

## 2023-02-19 LAB — TSH: TSH: <0.005 u[IU]/mL — ABNORMAL LOW (ref 0.450–4.500)

## 2023-02-21 ENCOUNTER — Other Ambulatory Visit: Payer: Self-pay | Admitting: Nurse Practitioner

## 2023-02-21 ENCOUNTER — Encounter: Payer: Self-pay | Admitting: Nurse Practitioner

## 2023-02-21 ENCOUNTER — Telehealth: Payer: Self-pay | Admitting: *Deleted

## 2023-02-21 DIAGNOSIS — Z8349 Family history of other endocrine, nutritional and metabolic diseases: Secondary | ICD-10-CM

## 2023-02-21 DIAGNOSIS — E0590 Thyrotoxicosis, unspecified without thyrotoxic crisis or storm: Secondary | ICD-10-CM

## 2023-02-21 NOTE — Telephone Encounter (Signed)
Patient left a message asking for her Thyroid Results.  She left in the message that she was having chest pain, and that her stomach was swollen.  Patient was called back. She states that she has seen the results on her my chart.  She is asking if there is any medicine that she can  take for the belly and her thyroid.  I explained that medications may mask the results of the upcoming test but that I would ask you.

## 2023-02-22 ENCOUNTER — Telehealth (INDEPENDENT_AMBULATORY_CARE_PROVIDER_SITE_OTHER): Payer: BC Managed Care – PPO | Admitting: Family Medicine

## 2023-02-22 ENCOUNTER — Encounter: Payer: Self-pay | Admitting: Family Medicine

## 2023-02-22 DIAGNOSIS — N760 Acute vaginitis: Secondary | ICD-10-CM

## 2023-02-22 DIAGNOSIS — R300 Dysuria: Secondary | ICD-10-CM

## 2023-02-22 DIAGNOSIS — N898 Other specified noninflammatory disorders of vagina: Secondary | ICD-10-CM | POA: Diagnosis not present

## 2023-02-22 DIAGNOSIS — B9689 Other specified bacterial agents as the cause of diseases classified elsewhere: Secondary | ICD-10-CM | POA: Diagnosis not present

## 2023-02-22 LAB — URINALYSIS, ROUTINE W REFLEX MICROSCOPIC
Bilirubin, UA: NEGATIVE
Glucose, UA: NEGATIVE
Ketones, UA: NEGATIVE
Leukocytes,UA: NEGATIVE
Nitrite, UA: NEGATIVE
Specific Gravity, UA: 1.02 (ref 1.005–1.030)
Urobilinogen, Ur: 0.2 mg/dL (ref 0.2–1.0)
pH, UA: 7.5 (ref 5.0–7.5)

## 2023-02-22 LAB — WET PREP FOR TRICH, YEAST, CLUE
Clue Cell Exam: POSITIVE — AB
Trichomonas Exam: NEGATIVE
Yeast Exam: NEGATIVE

## 2023-02-22 LAB — MICROSCOPIC EXAMINATION: Renal Epithel, UA: NONE SEEN /hpf

## 2023-02-22 MED ORDER — METRONIDAZOLE 500 MG PO TABS: 500.0000 mg | ORAL_TABLET | Freq: Two times a day (BID) | ORAL | 0 refills | Status: AC

## 2023-02-22 NOTE — Progress Notes (Signed)
   Virtual Visit via Video Note  I connected with Kristen Mejia on 02/22/23 at  4:45 PM EDT by a video enabled telemedicine application and verified that I am speaking with the correct person using two identifiers.  Patient Location: Home Provider Location: Office/Clinic  I discussed the limitations, risks, security, and privacy concerns of performing an evaluation and management service by video and the availability of in person appointments. I also discussed with the patient that there may be a patient responsible charge related to this service. The patient expressed understanding and agreed to proceed. Patient dropped off samples at 1:30  Subjective: PCP: Junie Spencer, FNP  Chief Complaint  Patient presents with   Vaginitis   HPI States that she has had BV in the past. Has odor and slight discharge, but not as much as in the pain. Denies pain with urination, itching, and denies fever. States that she is concerned about her thyroid. Symptoms started approx 3 days ago.   ROS: Per HPI No current outpatient medications on file.  Observations/Objective: There were no vitals filed for this visit. Physical Exam Constitutional:      General: She is awake. She is not in acute distress.    Appearance: Normal appearance. She is well-developed and well-groomed. She is not ill-appearing, toxic-appearing or diaphoretic.  Pulmonary:     Effort: Pulmonary effort is normal.  Neurological:     General: No focal deficit present.     Mental Status: She is alert and oriented to person, place, and time.  Psychiatric:        Attention and Perception: Attention and perception normal.        Mood and Affect: Mood and affect normal.        Speech: Speech normal.        Behavior: Behavior normal. Behavior is cooperative.        Thought Content: Thought content normal.        Cognition and Memory: Cognition and memory normal.        Judgment: Judgment normal.    Assessment and Plan: 1.  Discharge from the vagina Labs as below. Will communicate results to patient once available.  - WET PREP FOR TRICH, YEAST, CLUE  2. Dysuria Labs as below. Will communicate results to patient once available.  Will await results to determine next steps  - Urinalysis, Routine w reflex microscopic - Urine Culture - Microscopic Examination  3. Bacterial vaginosis Positive for bacterial vaginosis on wet prep. Medication as below. Patient to follow up if symptoms do not improve.  - metroNIDAZOLE (FLAGYL) 500 MG tablet; Take 1 tablet (500 mg total) by mouth 2 (two) times daily for 7 days.  Dispense: 14 tablet; Refill: 0   Total time spent on video 5:45 minutes   Follow Up Instructions: Return if symptoms worsen or fail to improve.   I discussed the assessment and treatment plan with the patient. The patient was provided an opportunity to ask questions, and all were answered. The patient agreed with the plan and demonstrated an understanding of the instructions.   The patient was advised to call back or seek an in-person evaluation if the symptoms worsen or if the condition fails to improve as anticipated.  The above assessment and management plan was discussed with the patient. The patient verbalized understanding of and has agreed to the management plan.   Neale Burly, DNP-FNP [facility] [address] [place], [place] [address] [phone]

## 2023-02-25 LAB — URINE CULTURE

## 2023-02-25 MED ORDER — CEPHALEXIN 500 MG PO CAPS: 500.0000 mg | ORAL_CAPSULE | Freq: Two times a day (BID) | ORAL | 0 refills | Status: AC

## 2023-02-25 NOTE — Progress Notes (Signed)
UTI with Viridans streptococcus group. Will treat with keflex 500 mg twice daily for 7 days. Follow up if symptoms do not resolve. Sent to [facility]

## 2023-02-25 NOTE — Addendum Note (Signed)
Addended by: Neale Burly on: 02/25/2023 05:13 PM   Modules accepted: Orders

## 2023-03-02 ENCOUNTER — Encounter (HOSPITAL_COMMUNITY): Admission: RE | Admit: 2023-03-02 | Payer: BC Managed Care – PPO | Source: Ambulatory Visit

## 2023-03-02 ENCOUNTER — Encounter (HOSPITAL_COMMUNITY): Payer: BC Managed Care – PPO

## 2023-03-03 ENCOUNTER — Encounter: Payer: Self-pay | Admitting: Family Medicine

## 2023-03-03 ENCOUNTER — Encounter (HOSPITAL_COMMUNITY): Payer: BC Managed Care – PPO

## 2023-03-07 ENCOUNTER — Encounter (HOSPITAL_COMMUNITY)
Admission: RE | Admit: 2023-03-07 | Discharge: 2023-03-07 | Disposition: A | Discharge status: Home / Self Care | Payer: BC Managed Care – PPO | Source: Ambulatory Visit | Attending: Nurse Practitioner | Admitting: Nurse Practitioner

## 2023-03-07 DIAGNOSIS — E0590 Thyrotoxicosis, unspecified without thyrotoxic crisis or storm: Secondary | ICD-10-CM | POA: Insufficient documentation

## 2023-03-07 DIAGNOSIS — Z8349 Family history of other endocrine, nutritional and metabolic diseases: Secondary | ICD-10-CM | POA: Diagnosis not present

## 2023-03-07 MED ORDER — SODIUM IODIDE I-123 7.4 MBQ CAPS
300.0000 | ORAL_CAPSULE | Freq: Once | ORAL | Status: AC
  Administered 2023-03-07: 300 via ORAL

## 2023-03-08 ENCOUNTER — Encounter (HOSPITAL_COMMUNITY)
Admission: RE | Admit: 2023-03-08 | Discharge: 2023-03-08 | Disposition: A | Discharge status: Home / Self Care | Payer: BC Managed Care – PPO | Source: Ambulatory Visit | Attending: Nurse Practitioner | Admitting: Nurse Practitioner

## 2023-03-08 DIAGNOSIS — E0590 Thyrotoxicosis, unspecified without thyrotoxic crisis or storm: Secondary | ICD-10-CM | POA: Diagnosis not present

## 2023-03-12 DIAGNOSIS — N946 Dysmenorrhea, unspecified: Secondary | ICD-10-CM | POA: Diagnosis not present

## 2023-03-12 DIAGNOSIS — R110 Nausea: Secondary | ICD-10-CM | POA: Diagnosis not present

## 2023-03-14 ENCOUNTER — Other Ambulatory Visit: Payer: Self-pay | Admitting: Nurse Practitioner

## 2023-03-14 DIAGNOSIS — E0590 Thyrotoxicosis, unspecified without thyrotoxic crisis or storm: Secondary | ICD-10-CM

## 2023-03-14 MED ORDER — METHIMAZOLE 5 MG PO TABS: 5.0000 mg | ORAL_TABLET | Freq: Two times a day (BID) | ORAL | 1 refills | Status: DC

## 2023-03-14 NOTE — Telephone Encounter (Signed)
Please get patient scheduled for follow up at next available slot or wait list.

## 2023-03-21 ENCOUNTER — Other Ambulatory Visit: Payer: Self-pay | Admitting: Nurse Practitioner

## 2023-03-21 MED ORDER — METHIMAZOLE 5 MG PO TABS: 5.0000 mg | ORAL_TABLET | Freq: Two times a day (BID) | ORAL | 1 refills | Status: DC

## 2023-03-21 NOTE — Telephone Encounter (Signed)
thanks

## 2023-03-21 NOTE — Telephone Encounter (Signed)
Is patient on the waitlist too for an earlier appt?

## 2023-03-22 ENCOUNTER — Ambulatory Visit: Payer: BC Managed Care – PPO | Admitting: Nurse Practitioner

## 2023-04-06 ENCOUNTER — Ambulatory Visit: Payer: BC Managed Care – PPO | Admitting: Family Medicine

## 2023-04-14 ENCOUNTER — Telehealth: Payer: Self-pay | Admitting: Nurse Practitioner

## 2023-04-14 DIAGNOSIS — E0590 Thyrotoxicosis, unspecified without thyrotoxic crisis or storm: Secondary | ICD-10-CM

## 2023-04-14 NOTE — Telephone Encounter (Signed)
Yes, I've put in some orders.

## 2023-04-14 NOTE — Telephone Encounter (Signed)
There aren't any orders for labs, does this patient need labs before next Friday?

## 2023-04-22 ENCOUNTER — Ambulatory Visit: Payer: BC Managed Care – PPO | Admitting: Nurse Practitioner

## 2023-04-22 DIAGNOSIS — E0500 Thyrotoxicosis with diffuse goiter without thyrotoxic crisis or storm: Secondary | ICD-10-CM

## 2023-04-22 DIAGNOSIS — E0590 Thyrotoxicosis, unspecified without thyrotoxic crisis or storm: Secondary | ICD-10-CM

## 2023-04-22 DIAGNOSIS — Z8349 Family history of other endocrine, nutritional and metabolic diseases: Secondary | ICD-10-CM

## 2023-05-18 ENCOUNTER — Ambulatory Visit: Payer: BC Managed Care – PPO | Admitting: Family Medicine

## 2023-05-19 ENCOUNTER — Ambulatory Visit (INDEPENDENT_AMBULATORY_CARE_PROVIDER_SITE_OTHER): Payer: BC Managed Care – PPO | Admitting: Family

## 2023-05-19 ENCOUNTER — Encounter: Payer: Self-pay | Admitting: Family

## 2023-05-19 ENCOUNTER — Other Ambulatory Visit (HOSPITAL_COMMUNITY)
Admission: RE | Admit: 2023-05-19 | Discharge: 2023-05-19 | Disposition: A | Discharge status: Home / Self Care | Payer: BC Managed Care – PPO | Source: Ambulatory Visit | Attending: Family Medicine | Admitting: Family Medicine

## 2023-05-19 VITALS — BP 121/77 | HR 64 | Temp 97.7°F | Ht 65.0 in | Wt 159.0 lb

## 2023-05-19 DIAGNOSIS — N898 Other specified noninflammatory disorders of vagina: Secondary | ICD-10-CM

## 2023-05-19 DIAGNOSIS — B3731 Acute candidiasis of vulva and vagina: Secondary | ICD-10-CM

## 2023-05-19 DIAGNOSIS — B9689 Other specified bacterial agents as the cause of diseases classified elsewhere: Secondary | ICD-10-CM | POA: Diagnosis not present

## 2023-05-19 DIAGNOSIS — Z113 Encounter for screening for infections with a predominantly sexual mode of transmission: Secondary | ICD-10-CM | POA: Diagnosis not present

## 2023-05-19 DIAGNOSIS — N760 Acute vaginitis: Secondary | ICD-10-CM

## 2023-05-19 LAB — WET PREP FOR TRICH, YEAST, CLUE
Clue Cell Exam: POSITIVE — AB
Trichomonas Exam: NEGATIVE
Yeast Exam: POSITIVE — AB

## 2023-05-19 MED ORDER — FLUCONAZOLE 150 MG PO TABS
150.0000 mg | ORAL_TABLET | ORAL | 0 refills | Status: DC | PRN
Start: 2023-05-19 — End: 2023-05-27

## 2023-05-19 MED ORDER — METRONIDAZOLE 500 MG PO TABS
500.0000 mg | ORAL_TABLET | Freq: Two times a day (BID) | ORAL | 0 refills | Status: DC
Start: 2023-05-19 — End: 2023-06-10

## 2023-05-19 NOTE — Patient Instructions (Signed)

## 2023-05-19 NOTE — Progress Notes (Addendum)
Subjective:    Patient ID: Kristen Mejia, female    DOB: 12-18-2000, 22 y.o.   MRN: [id number]  Chief Complaint  Patient presents with   STD CHECK    She presents to the office today for STD testing. Denies any symptoms at this time or known exposures. She is sexually active. Denies any dysuria, abdominal pain, or fever. Has noticed a brown vaginal discharge and odor.  Vaginal Discharge The patient's primary symptoms include a genital odor and vaginal discharge. The patient's pertinent negatives include no genital itching or genital lesions. This is a new problem. The current episode started 1 to 4 weeks ago. The problem occurs intermittently. The vaginal discharge was brown. There has been no bleeding.      Review of Systems  Genitourinary:  Positive for vaginal discharge.  All other systems reviewed and are negative.      Objective:   Physical Exam Vitals reviewed.  Constitutional:      General: She is not in acute distress.    Appearance: She is well-developed.  HENT:     Head: Normocephalic and atraumatic.     Right Ear: Tympanic membrane normal.     Left Ear: Tympanic membrane normal.  Eyes:     Pupils: Pupils are equal, round, and reactive to light.  Neck:     Thyroid: No thyromegaly.  Cardiovascular:     Rate and Rhythm: Normal rate and regular rhythm.     Heart sounds: Normal heart sounds. No murmur heard. Pulmonary:     Effort: Pulmonary effort is normal. No respiratory distress.     Breath sounds: Normal breath sounds. No wheezing.  Abdominal:     General: Bowel sounds are normal. There is no distension.     Palpations: Abdomen is soft.     Tenderness: There is no abdominal tenderness.  Musculoskeletal:        General: No tenderness. Normal range of motion.     Cervical back: Normal range of motion and neck supple.  Skin:    General: Skin is warm and dry.  Neurological:     Mental Status: She is alert and oriented to person, place, and time.     Cranial  Nerves: No cranial nerve deficit.     Deep Tendon Reflexes: Reflexes are normal and symmetric.  Psychiatric:        Behavior: Behavior normal.        Thought Content: Thought content normal.        Judgment: Judgment normal.    BP 121/77   Pulse 64   Temp 97.7 F (36.5 C) (Temporal)   Ht 5\' 5"  (1.651 m)   Wt 159 lb (72.1 kg)   SpO2 98%   BMI 26.46 kg/m         Assessment & Plan:   Kristen Mejia comes in today with chief complaint of STD CHECK    Diagnosis and orders addressed:  1. Vaginal discharge - WET PREP FOR TRICH, YEAST, CLUE - Urine cytology ancillary only  2. Screening for STD (sexually transmitted disease) - WET PREP FOR TRICH, YEAST, CLUE - Hepatitis C antibody - HIV Antibody (routine testing w rflx) - HSV 1 and 2 Ab, IgG - RPR - Urine cytology ancillary only    3. Bacterial vaginitis Start Flagyl  - metroNIDAZOLE (FLAGYL) 500 MG tablet; Take 1 tablet (500 mg total) by mouth 2 (two) times daily.  Dispense: 14 tablet; Refill: 0  4. Vagina, candidiasis Start diflucan   -  fluconazole (DIFLUCAN) 150 MG tablet; Take 1 tablet (150 mg total) by mouth every three (3) days as needed.  Dispense: 3 tablet; Refill: 0  Labs pending Safe sex Follow up if symptoms worsen or do not improve   [name], FNP

## 2023-05-19 NOTE — Addendum Note (Signed)
Addended by: [name] A on: 05/19/2023 03:03 PM   Modules accepted: Orders

## 2023-05-20 LAB — HEPATITIS C ANTIBODY: Hep C Virus Ab: NONREACTIVE

## 2023-05-20 LAB — HSV 1 AND 2 AB, IGG
HSV 1 Glycoprotein G Ab, IgG: NONREACTIVE
HSV 2 IgG, Type Spec: NONREACTIVE

## 2023-05-20 LAB — HIV ANTIBODY (ROUTINE TESTING W REFLEX): HIV Screen 4th Generation wRfx: NONREACTIVE

## 2023-05-20 LAB — RPR: RPR Ser Ql: NONREACTIVE

## 2023-05-21 DIAGNOSIS — B3731 Acute candidiasis of vulva and vagina: Secondary | ICD-10-CM

## 2023-05-22 ENCOUNTER — Telehealth: Payer: BC Managed Care – PPO | Admitting: Nurse Practitioner

## 2023-05-22 DIAGNOSIS — N944 Primary dysmenorrhea: Secondary | ICD-10-CM | POA: Diagnosis not present

## 2023-05-22 MED ORDER — IBUPROFEN 800 MG PO TABS
800.0000 mg | ORAL_TABLET | Freq: Three times a day (TID) | ORAL | 0 refills | Status: DC | PRN
Start: 2023-05-22 — End: 2023-06-10

## 2023-05-22 NOTE — Progress Notes (Signed)
Virtual Visit Consent   Kristen Mejia, you are scheduled for a virtual visit with a [facility] provider today. Just as with appointments in the office, your consent must be obtained to participate. Your consent will be active for this visit and any virtual visit you may have with one of our providers in the next 365 days. If you have a MyChart account, a copy of this consent can be sent to you electronically.  As this is a virtual visit, video technology does not allow for your provider to perform a traditional examination. This may limit your provider's ability to fully assess your condition. If your provider identifies any concerns that need to be evaluated in person or the need to arrange testing (such as labs, EKG, etc.), we will make arrangements to do so. Although advances in technology are sophisticated, we cannot ensure that it will always work on either your end or our end. If the connection with a video visit is poor, the visit may have to be switched to a telephone visit. With either a video or telephone visit, we are not always able to ensure that we have a secure connection.  By engaging in this virtual visit, you consent to the provision of healthcare and authorize for your insurance to be billed (if applicable) for the services provided during this visit. Depending on your insurance coverage, you may receive a charge related to this service.  I need to obtain your verbal consent now. Are you willing to proceed with your visit today? Kristen Mejia has provided verbal consent on 05/22/2023 for a virtual visit (video or telephone). Claiborne Rigg, NP  Date: 05/22/2023 11:58 AM  Virtual Visit via Video Note   I, Claiborne Rigg, connected with  Kristen Mejia  ([id number], 2001-03-13) on 05/22/23 at 12:00 PM EDT by a video-enabled telemedicine application and verified that I am speaking with the correct person using two identifiers.  Location: Patient: Virtual Visit Location Patient:  Home Provider: Virtual Visit Location Provider: Home Office   I discussed the limitations of evaluation and management by telemedicine and the availability of in person appointments. The patient expressed understanding and agreed to proceed.    History of Present Illness: Kristen Mejia is a 22 y.o. who identifies as a female who was assigned female at birth, and is being seen today for menstrual cramps and fatigue .   Kristen Mejia is experiencing irregular periods due to what she believes is from her abnormal thyroid. Due to the irregular periods she has been experiencing more cramping, bleeding and fatigue. Taking OTC analgesics for pain with no relief.     Problems:  Patient Active Problem List   Diagnosis Date Noted   Nausea and vomiting 02/02/2023   Thyroiditis 02/02/2023   Counseling for birth control, oral contraceptives 01/10/2023   OCP (oral contraceptive pills) initiation 01/10/2023   Nasal fracture 10/15/2022   Attention deficit hyperactivity disorder (ADHD), combined type 06/24/2022   Controlled substance agreement signed 06/24/2022   Irregular periods 06/10/2021   Irritable bowel syndrome with both constipation and diarrhea 10/20/2018   GAD (generalized anxiety disorder) 04/25/2018    Allergies: No Known Allergies Medications:  Current Outpatient Medications:    ibuprofen (ADVIL) 800 MG tablet, Take 1 tablet (800 mg total) by mouth every 8 (eight) hours as needed., Disp: 60 tablet, Rfl: 0   fluconazole (DIFLUCAN) 150 MG tablet, Take 1 tablet (150 mg total) by mouth every three (3) days as needed., Disp: 3 tablet, Rfl: 0  methimazole (TAPAZOLE) 5 MG tablet, Take 1 tablet (5 mg total) by mouth 2 (two) times daily., Disp: 60 tablet, Rfl: 1   metroNIDAZOLE (FLAGYL) 500 MG tablet, Take 1 tablet (500 mg total) by mouth 2 (two) times daily., Disp: 14 tablet, Rfl: 0  Observations/Objective: Patient is well-developed, well-nourished in no acute distress.  Resting comfortably at  home.  Head is normocephalic, atraumatic.  No labored breathing.  Speech is clear and coherent with logical content.  Patient is alert and oriented at baseline.   Assessment and Plan: 1. Primary dysmenorrhea - ibuprofen (ADVIL) 800 MG tablet; Take 1 tablet (800 mg total) by mouth every 8 (eight) hours as needed.  Dispense: 60 tablet; Refill: 0    Follow Up Instructions: I discussed the assessment and treatment plan with the patient. The patient was provided an opportunity to ask questions and all were answered. The patient agreed with the plan and demonstrated an understanding of the instructions.  A copy of instructions were sent to the patient via MyChart unless otherwise noted below.    The patient was advised to call back or seek an in-person evaluation if the symptoms worsen or if the condition fails to improve as anticipated.    Claiborne Rigg, NP

## 2023-05-22 NOTE — Patient Instructions (Signed)
  [name], thank you for joining Claiborne Rigg, NP for today's virtual visit.  While this provider is not your primary care provider (PCP), if your PCP is located in our provider database this encounter information will be shared with them immediately following your visit.   A [facility] MyChart account gives you access to today's visit and all your visits, tests, and labs performed at [facility] " click here if you don't have a [facility] MyChart account or go to [email or web address]  Consent: (Patient) Cassady Stanczak provided verbal consent for this virtual visit at the beginning of the encounter.  Current Medications:  Current Outpatient Medications:    ibuprofen (ADVIL) 800 MG tablet, Take 1 tablet (800 mg total) by mouth every 8 (eight) hours as needed., Disp: 60 tablet, Rfl: 0   fluconazole (DIFLUCAN) 150 MG tablet, Take 1 tablet (150 mg total) by mouth every three (3) days as needed., Disp: 3 tablet, Rfl: 0   methimazole (TAPAZOLE) 5 MG tablet, Take 1 tablet (5 mg total) by mouth 2 (two) times daily., Disp: 60 tablet, Rfl: 1   metroNIDAZOLE (FLAGYL) 500 MG tablet, Take 1 tablet (500 mg total) by mouth 2 (two) times daily., Disp: 14 tablet, Rfl: 0   Medications ordered in this encounter:  Meds ordered this encounter  Medications   ibuprofen (ADVIL) 800 MG tablet    Sig: Take 1 tablet (800 mg total) by mouth every 8 (eight) hours as needed.    Dispense:  60 tablet    Refill:  0    Order Specific Question:   Supervising Provider    Answer:   Merrilee Jansky [id number]     *If you need refills on other medications prior to your next appointment, please contact your pharmacy*  Follow-Up: Call back or seek an in-person evaluation if the symptoms worsen or if the condition fails to improve as anticipated.  [facility] [phone]  Other Instructions Warm compresses or heating pad to abdomin for relief of pain   If you have been  instructed to have an in-person evaluation today at a local Urgent Care facility, please use the link below. It will take you to a list of all of our available [facility], including address, phone number and hours of operation. Please do not delay care.  [facility]  If you or a family member do not have a primary care provider, use the link below to schedule a visit and establish care. When you choose a [facility] primary care physician or advanced practice provider, you gain a long-term partner in health. Find a Primary Care Provider  Learn more about [facility]'s in-office and virtual care options: [facility] - Get Care Now

## 2023-05-23 ENCOUNTER — Telehealth: Payer: Self-pay | Admitting: Family

## 2023-05-23 LAB — URINE CYTOLOGY ANCILLARY ONLY
Chlamydia: NEGATIVE
Comment: NEGATIVE
Comment: NEGATIVE
Comment: NORMAL
Neisseria Gonorrhea: NEGATIVE
Trichomonas: NEGATIVE

## 2023-05-23 NOTE — Telephone Encounter (Signed)
Returned patient call she is aware [facility] will respond through her [employer]

## 2023-05-23 NOTE — Telephone Encounter (Signed)
This is ok, she is already positive for yeast.

## 2023-05-27 MED ORDER — FLUCONAZOLE 150 MG PO TABS: 150.0000 mg | ORAL_TABLET | Freq: Once | ORAL | 0 refills | Status: AC

## 2023-05-27 NOTE — Addendum Note (Signed)
Addended by: Arville Care on: 05/27/2023 11:26 AM   Modules accepted: Orders

## 2023-05-30 ENCOUNTER — Telehealth: Payer: Self-pay | Admitting: Nurse Practitioner

## 2023-05-30 NOTE — Telephone Encounter (Signed)
Pt left a VM stating she missed her last appt but her thyroid is making her really tired. She wants to get back on the schedule asap. Please Advise if you want me to [facility]

## 2023-05-30 NOTE — Telephone Encounter (Signed)
We can put her on the wait list.

## 2023-05-30 NOTE — Telephone Encounter (Signed)
No answer, sent pt mychart message.

## 2023-06-03 ENCOUNTER — Ambulatory Visit: Payer: BC Managed Care – PPO | Admitting: Nurse Practitioner

## 2023-06-03 DIAGNOSIS — E0500 Thyrotoxicosis with diffuse goiter without thyrotoxic crisis or storm: Secondary | ICD-10-CM

## 2023-06-03 DIAGNOSIS — E0590 Thyrotoxicosis, unspecified without thyrotoxic crisis or storm: Secondary | ICD-10-CM

## 2023-06-03 DIAGNOSIS — Z8349 Family history of other endocrine, nutritional and metabolic diseases: Secondary | ICD-10-CM

## 2023-06-09 ENCOUNTER — Ambulatory Visit: Payer: BC Managed Care – PPO | Admitting: Family

## 2023-06-10 ENCOUNTER — Encounter: Payer: Self-pay | Admitting: Family Medicine

## 2023-06-10 ENCOUNTER — Telehealth: Payer: Self-pay | Admitting: Family

## 2023-06-10 ENCOUNTER — Ambulatory Visit (INDEPENDENT_AMBULATORY_CARE_PROVIDER_SITE_OTHER): Payer: BC Managed Care – PPO | Admitting: Family Medicine

## 2023-06-10 VITALS — BP 131/84 | HR 67 | Temp 98.7°F | Ht 65.0 in | Wt 156.0 lb

## 2023-06-10 DIAGNOSIS — Z3201 Encounter for pregnancy test, result positive: Secondary | ICD-10-CM

## 2023-06-10 DIAGNOSIS — N926 Irregular menstruation, unspecified: Secondary | ICD-10-CM | POA: Diagnosis not present

## 2023-06-10 LAB — PREGNANCY, URINE: Preg Test, Ur: POSITIVE — AB

## 2023-06-10 NOTE — Telephone Encounter (Signed)
Pt called back in and information was given to her.  She understood the instructions.
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
## 2023-06-10 NOTE — Telephone Encounter (Signed)
  Incoming Patient Call  06/10/2023  What symptoms do you have? Nausea   How long have you been sick?   Have you been seen for this problem? Seen by GM on 11/1  If your provider decides to give you a prescription, which pharmacy would you like for it to be sent to? [facility] [place]   *wants something called in for nausea   Patient informed that this information will be sent to the clinical staff for review and that they should receive a follow up call.

## 2023-06-10 NOTE — Telephone Encounter (Signed)
Please review

## 2023-06-10 NOTE — Patient Instructions (Signed)
[email or web address]  Safe Medications in Pregnancy   Acne: Benzoyl Peroxide Salicylic Acid  Backache/Headache: Tylenol: 2 regular strength every 4 hours OR              2 Extra strength every 6 hours  Colds/Coughs/Allergies: Benadryl (alcohol free) 25 mg every 6 hours as needed Breath right strips Claritin Cepacol throat lozenges Chloraseptic throat spray Cold-Eeze- up to three times per day Cough drops, alcohol free Flonase (by prescription only) Guaifenesin Mucinex Robitussin DM (plain only, alcohol free) Saline nasal spray/drops Sudafed (pseudoephedrine) & Actifed ** use only after [redacted] weeks gestation and if you do not have high blood pressure Tylenol Vicks Vaporub Zinc lozenges Zyrtec   Constipation: Colace Ducolax suppositories Fleet enema Glycerin suppositories Metamucil Milk of magnesia Miralax Senokot Smooth move tea  Diarrhea: Kaopectate Imodium A-D  *NO pepto Bismol  Hemorrhoids: Anusol Anusol HC Preparation H Tucks  Indigestion: Tums Maalox Mylanta Zantac  Pepcid  Insomnia: Benadryl (alcohol free) 25mg  every 6 hours as needed Tylenol PM Unisom, no Gelcaps  Leg Cramps: Tums MagGel  Nausea/Vomiting:  Bonine Dramamine Emetrol Ginger extract Sea bands Meclizine  Nausea medication to take during pregnancy:  Unisom (doxylamine succinate 25 mg tablets) Take one tablet daily at bedtime. If symptoms are not adequately controlled, the dose can be increased to a maximum recommended dose of two tablets daily (1/2 tablet in the morning, 1/2 tablet mid-afternoon and one at bedtime). Vitamin B6 100mg  tablets. Take one tablet twice a day (up to 200 mg per day).  Skin Rashes: Aveeno products Benadryl cream or 25mg  every 6 hours as needed Calamine Lotion 1% cortisone cream  Yeast infection: Gyne-lotrimin 7 Monistat 7   **If taking multiple medications, please check labels to avoid duplicating the same active ingredients **take medication as  directed on the label ** Do not exceed 4000 mg of tylenol in 24 hours **Do not take medications that contain aspirin or ibuprofen

## 2023-06-10 NOTE — Telephone Encounter (Signed)
For nausea in pregnancy, recommend OTC unisom and Vitamin B6.

## 2023-06-11 LAB — BETA HCG QUANT (REF LAB): hCG Quant: 11617 m[IU]/mL

## 2023-06-14 NOTE — Progress Notes (Signed)
Quantitative hcG, indicating approximately 7 weeks. Recommend patient start a prenatal vitamin if not already taking.

## 2023-06-26 ENCOUNTER — Other Ambulatory Visit: Payer: Self-pay | Admitting: Obstetrics & Gynecology

## 2023-06-26 DIAGNOSIS — Z3201 Encounter for pregnancy test, result positive: Secondary | ICD-10-CM

## 2023-06-26 DIAGNOSIS — Z789 Other specified health status: Secondary | ICD-10-CM

## 2023-06-26 DIAGNOSIS — O3680X1 Pregnancy with inconclusive fetal viability, fetus 1: Secondary | ICD-10-CM

## 2023-06-28 ENCOUNTER — Other Ambulatory Visit: Payer: Self-pay | Admitting: Women's Health

## 2023-06-28 ENCOUNTER — Encounter: Payer: Self-pay | Admitting: Nurse Practitioner

## 2023-06-28 ENCOUNTER — Ambulatory Visit (INDEPENDENT_AMBULATORY_CARE_PROVIDER_SITE_OTHER): Payer: Self-pay

## 2023-06-28 ENCOUNTER — Telehealth: Payer: Self-pay | Admitting: Adult Health

## 2023-06-28 DIAGNOSIS — Z3201 Encounter for pregnancy test, result positive: Secondary | ICD-10-CM

## 2023-06-28 DIAGNOSIS — Z3A08 8 weeks gestation of pregnancy: Secondary | ICD-10-CM | POA: Diagnosis not present

## 2023-06-28 DIAGNOSIS — Z789 Other specified health status: Secondary | ICD-10-CM | POA: Diagnosis not present

## 2023-06-28 DIAGNOSIS — E0590 Thyrotoxicosis, unspecified without thyrotoxic crisis or storm: Secondary | ICD-10-CM | POA: Diagnosis not present

## 2023-06-28 DIAGNOSIS — O3680X1 Pregnancy with inconclusive fetal viability, fetus 1: Secondary | ICD-10-CM

## 2023-06-28 MED ORDER — DOXYLAMINE-PYRIDOXINE 10-10 MG PO TBEC: DELAYED_RELEASE_TABLET | ORAL | 6 refills | Status: DC

## 2023-06-28 NOTE — Telephone Encounter (Signed)
Patient with positive pregnancy test, needs to be worked in sooner but labs need to be done before.

## 2023-06-28 NOTE — Progress Notes (Signed)
[place] 8+2 wks,single IUP with YS,? bicornuate uterus,pregnancy left horn,FHR 169 bpm,right ovary not visualized,normal left ovary

## 2023-06-28 NOTE — Telephone Encounter (Signed)
Patient had dating appt earlier today, called back asking if she can get [facility] for [place] [facility] [place]

## 2023-06-29 LAB — T3, FREE: T3, Free: 5.3 pg/mL — ABNORMAL HIGH (ref 2.0–4.4)

## 2023-06-29 LAB — T4, FREE: Free T4: 1.87 ng/dL — ABNORMAL HIGH (ref 0.82–1.77)

## 2023-06-29 LAB — TSH: TSH: <0.005 u[IU]/mL — ABNORMAL LOW (ref 0.450–4.500)

## 2023-06-29 NOTE — Telephone Encounter (Signed)
See patient's message.

## 2023-06-29 NOTE — Telephone Encounter (Signed)
See msg

## 2023-06-29 NOTE — Telephone Encounter (Signed)
We can schedule her now that her labs have been done.

## 2023-06-30 NOTE — Patient Instructions (Signed)
Hyperthyroidism Hyperthyroidism refers to a thyroid gland that is too active or overactive. The thyroid gland is a small gland located in the lower front part of the neck, just in front of the windpipe (trachea). This gland makes hormones that: Help control how the body uses food for energy (metabolism). Help the heart and brain work well. Keep your bones strong. When the thyroid is overactive, it produces too much of a hormone called thyroxine. What are the causes? This condition may be caused by: Graves' disease. This is a disorder in which the body's disease-fighting system (immune system) attacks the thyroid gland. This is the most common cause. Inflammation of the thyroid gland. A tumor in the thyroid gland. Use of certain medicines, including: Prescription thyroid hormone replacement. Herbal supplements that mimic thyroid hormones. Amiodarone therapy. Solid or fluid-filled lumps within your thyroid gland (thyroid nodules). Taking in a large amount of iodine from foods or medicines. What increases the risk? You are more likely to develop this condition if: You are female. You have a family history of thyroid conditions. You smoke tobacco. You use a medicine called lithium. You take medicines that affect the immune system (immunosuppressants). What are the signs or symptoms? Symptoms of this condition include: Nervousness. Inability to tolerate heat. Diarrhea. Rapid heart rate. Shaky hands. Restlessness. Sleep problems. Other symptoms may include: Heart skipping beats or making extra beats. Unexplained weight loss. Change in the texture of hair or skin. Loss of menstruation. Fatigue. Enlarged thyroid gland or a lump in the thyroid (nodule). You may also have symptoms of Graves' disease, which may include: Protruding eyes. Dry eyes. Red or swollen eyes. Problems with vision. How is this diagnosed? This condition may be diagnosed based on: Your symptoms and medical  history. A physical exam. Blood tests. Thyroid ultrasound. This test involves using sound waves to produce images of the thyroid gland. A thyroid scan. A radioactive substance is injected into a vein, and images show how much iodine is present in the thyroid. Radioactive iodine uptake test (RAIU). A small amount of radioactive iodine is given by mouth to see how much iodine the thyroid absorbs after a certain amount of time. How is this treated? Treatment depends on the cause and severity of the condition. Treatment may include: Medicines to reduce the amount of thyroid hormone your body makes. Medicines to help manage your symptoms. Radioactive iodine treatment (radioiodine therapy). This involves swallowing a small dose of radioactive iodine, in capsule or liquid form, to kill thyroid cells. Surgery to remove part or all of your thyroid gland. You may need to take thyroid hormone replacement medicine for the rest of your life after thyroid surgery. Follow these instructions at home:  Take over-the-counter and prescription medicines only as told by your health care provider. Do not use any products that contain nicotine or tobacco. These products include cigarettes, chewing tobacco, and vaping devices, such as e-cigarettes. If you need help quitting, ask your health care provider. Follow any instructions from your health care provider about diet. You may be instructed to limit foods that contain iodine. Keep all follow-up visits. You will need to have blood tests regularly so that your health care provider can monitor your condition. Where to find more information National Institute of Diabetes and Digestive and Kidney Diseases: niddk.nih.gov Contact a health care provider if: Your symptoms do not get better with treatment. You have a fever. You have abdominal pain. You feel dizzy. You are taking thyroid hormone replacement medicine and: You have   symptoms of depression. You feel like you  are tired all the time. You gain weight. Get help right away if: You have sudden, unexplained confusion or other mental changes. You have chest pain. You have fast or irregular heartbeats (palpitations). You have difficulty breathing. These symptoms may be an emergency. Get help right away. Call 911. Do not wait to see if the symptoms will go away. Do not drive yourself to the hospital. Summary The thyroid gland is a small gland located in the lower front part of the neck, just in front of the windpipe. Hyperthyroidism is when the thyroid gland is too active and produces too much of a hormone called thyroxine. The most common cause is Graves' disease, a disorder in which your immune system attacks the thyroid gland. Hyperthyroidism can cause various symptoms, such as unexplained weight loss, nervousness, inability to tolerate heat, or changes in your heartbeat. Treatment may include medicine to reduce the amount of thyroid hormone your body makes, radioiodine therapy, surgery, or medicines to manage symptoms. This information is not intended to replace advice given to you by your health care provider. Make sure you discuss any questions you have with your health care provider. Document Revised: 09/18/2021 Document Reviewed: 09/18/2021 Elsevier Patient Education  2024 Elsevier Inc.  

## 2023-07-04 ENCOUNTER — Encounter: Payer: Self-pay | Admitting: Nurse Practitioner

## 2023-07-04 ENCOUNTER — Ambulatory Visit: Payer: BC Managed Care – PPO | Admitting: Nurse Practitioner

## 2023-07-04 ENCOUNTER — Ambulatory Visit (INDEPENDENT_AMBULATORY_CARE_PROVIDER_SITE_OTHER): Payer: BC Managed Care – PPO | Admitting: Nurse Practitioner

## 2023-07-04 VITALS — BP 119/79 | HR 85 | Ht 65.0 in | Wt 153.6 lb

## 2023-07-04 DIAGNOSIS — E0590 Thyrotoxicosis, unspecified without thyrotoxic crisis or storm: Secondary | ICD-10-CM

## 2023-07-04 DIAGNOSIS — Z8349 Family history of other endocrine, nutritional and metabolic diseases: Secondary | ICD-10-CM | POA: Diagnosis not present

## 2023-07-04 MED ORDER — PROPYLTHIOURACIL 50 MG PO TABS: 50.0000 mg | ORAL_TABLET | Freq: Two times a day (BID) | ORAL | 0 refills | Status: DC
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
## 2023-07-17 DIAGNOSIS — Z3A11 11 weeks gestation of pregnancy: Secondary | ICD-10-CM | POA: Diagnosis not present

## 2023-07-17 DIAGNOSIS — K219 Gastro-esophageal reflux disease without esophagitis: Secondary | ICD-10-CM | POA: Diagnosis not present

## 2023-07-17 DIAGNOSIS — F419 Anxiety disorder, unspecified: Secondary | ICD-10-CM | POA: Diagnosis not present

## 2023-07-17 DIAGNOSIS — O99281 Endocrine, nutritional and metabolic diseases complicating pregnancy, first trimester: Secondary | ICD-10-CM | POA: Diagnosis not present

## 2023-07-17 DIAGNOSIS — R300 Dysuria: Secondary | ICD-10-CM | POA: Diagnosis not present

## 2023-07-17 DIAGNOSIS — R110 Nausea: Secondary | ICD-10-CM | POA: Diagnosis not present

## 2023-07-17 DIAGNOSIS — O99611 Diseases of the digestive system complicating pregnancy, first trimester: Secondary | ICD-10-CM | POA: Diagnosis not present

## 2023-07-17 DIAGNOSIS — E860 Dehydration: Secondary | ICD-10-CM | POA: Diagnosis not present

## 2023-07-17 DIAGNOSIS — O99341 Other mental disorders complicating pregnancy, first trimester: Secondary | ICD-10-CM | POA: Diagnosis not present

## 2023-07-17 DIAGNOSIS — K589 Irritable bowel syndrome without diarrhea: Secondary | ICD-10-CM | POA: Diagnosis not present

## 2023-07-17 DIAGNOSIS — R1030 Lower abdominal pain, unspecified: Secondary | ICD-10-CM | POA: Diagnosis not present

## 2023-07-17 DIAGNOSIS — O211 Hyperemesis gravidarum with metabolic disturbance: Secondary | ICD-10-CM | POA: Diagnosis not present

## 2023-07-17 DIAGNOSIS — R319 Hematuria, unspecified: Secondary | ICD-10-CM | POA: Diagnosis not present

## 2023-07-17 DIAGNOSIS — O219 Vomiting of pregnancy, unspecified: Secondary | ICD-10-CM | POA: Diagnosis not present

## 2023-07-26 ENCOUNTER — Other Ambulatory Visit: Payer: Self-pay | Admitting: Obstetrics & Gynecology

## 2023-07-26 DIAGNOSIS — Z3682 Encounter for antenatal screening for nuchal translucency: Secondary | ICD-10-CM

## 2023-07-27 ENCOUNTER — Other Ambulatory Visit (HOSPITAL_COMMUNITY)
Admission: RE | Admit: 2023-07-27 | Discharge: 2023-07-27 | Disposition: A | Discharge status: Home / Self Care | Payer: BC Managed Care – PPO | Source: Ambulatory Visit | Attending: Advanced Practice Midwife | Admitting: Advanced Practice Midwife

## 2023-07-27 ENCOUNTER — Ambulatory Visit (INDEPENDENT_AMBULATORY_CARE_PROVIDER_SITE_OTHER): Payer: Self-pay

## 2023-07-27 ENCOUNTER — Encounter: Payer: Self-pay | Admitting: Advanced Practice Midwife

## 2023-07-27 ENCOUNTER — Ambulatory Visit (INDEPENDENT_AMBULATORY_CARE_PROVIDER_SITE_OTHER): Payer: BC Managed Care – PPO | Admitting: Advanced Practice Midwife

## 2023-07-27 ENCOUNTER — Encounter: Payer: BC Managed Care – PPO | Admitting: *Deleted

## 2023-07-27 VITALS — BP 108/70 | HR 65 | Wt 152.0 lb

## 2023-07-27 DIAGNOSIS — Z3A12 12 weeks gestation of pregnancy: Secondary | ICD-10-CM

## 2023-07-27 DIAGNOSIS — O0990 Supervision of high risk pregnancy, unspecified, unspecified trimester: Secondary | ICD-10-CM | POA: Insufficient documentation

## 2023-07-27 DIAGNOSIS — Z3401 Encounter for supervision of normal first pregnancy, first trimester: Secondary | ICD-10-CM | POA: Diagnosis not present

## 2023-07-27 DIAGNOSIS — E069 Thyroiditis, unspecified: Secondary | ICD-10-CM

## 2023-07-27 DIAGNOSIS — Z363 Encounter for antenatal screening for malformations: Secondary | ICD-10-CM | POA: Diagnosis not present

## 2023-07-27 DIAGNOSIS — Z124 Encounter for screening for malignant neoplasm of cervix: Secondary | ICD-10-CM | POA: Insufficient documentation

## 2023-07-27 DIAGNOSIS — Z131 Encounter for screening for diabetes mellitus: Secondary | ICD-10-CM

## 2023-07-27 DIAGNOSIS — Z3400 Encounter for supervision of normal first pregnancy, unspecified trimester: Secondary | ICD-10-CM | POA: Insufficient documentation

## 2023-07-27 DIAGNOSIS — Z113 Encounter for screening for infections with a predominantly sexual mode of transmission: Secondary | ICD-10-CM

## 2023-07-27 DIAGNOSIS — Z3682 Encounter for antenatal screening for nuchal translucency: Secondary | ICD-10-CM

## 2023-07-27 NOTE — Progress Notes (Signed)
[place] 12+3 wks,measurements c/w dates,CRL 55.66 mm,NB present,NT 1.6 mm,normal ovaries,bicornuate uterus,FHR 159 bpm

## 2023-07-27 NOTE — Progress Notes (Signed)
INITIAL OBSTETRICAL VISIT Patient name: Kristen Mejia MRN [id number]  Date of birth: 18-Feb-2001 Chief Complaint:   Initial Prenatal Visit  History of Present Illness:   Kristen Mejia is a 22 y.o. G1P0 Caucasian female at [redacted]w[redacted]d by [place] at 8.2 weeks with an Estimated Date of Delivery: 02/05/24 being seen today for her initial obstetrical visit.   No LMP recorded. Patient is pregnant. Her obstetrical history is significant for primigravida.   Today she reports no complaints.  Last pap never.      07/27/2023   10:28 AM 06/10/2023    9:22 AM 05/19/2023    2:19 PM 02/07/2023    2:28 PM 01/10/2023    3:17 PM  Depression screen PHQ 2/9  Decreased Interest 0 0 0 0 0  Down, Depressed, Hopeless 0 0 0 0 0  PHQ - 2 Score 0 0 0 0 0  Altered sleeping 2 0  0 0  Tired, decreased energy 3 0  0 0  Change in appetite 2 0  0 0  Feeling bad or failure about yourself  0 0  0 0  Trouble concentrating 0 0  0 0  Moving slowly or fidgety/restless 0 0  0 0  Suicidal thoughts 0 0  0 0  PHQ-9 Score 7 0  0 0  Difficult doing work/chores  Not difficult at all  Not difficult at all Not difficult at all        07/27/2023   10:29 AM 06/10/2023    9:22 AM 02/07/2023    2:28 PM 01/10/2023    3:17 PM  GAD 7 : Generalized Anxiety Score  Nervous, Anxious, on Edge 0 0 0 0  Control/stop worrying 0 0 0 0  Worry too much - different things 0 0 0 0  Trouble relaxing 0 0 0 0  Restless 0 0 0 0  Easily annoyed or irritable 0 0 0 0  Afraid - awful might happen 0 0 0 0  Total GAD 7 Score 0 0 0 0  Anxiety Difficulty  Not difficult at all Not difficult at all Not difficult at all     Review of Systems:   Pertinent items are noted in HPI Denies cramping/contractions, leakage of fluid, vaginal bleeding, abnormal vaginal discharge w/ itching/odor/irritation, headaches, visual changes, shortness of breath, chest pain, abdominal pain, severe nausea/vomiting, or problems with urination or bowel movements unless otherwise stated  above.  Pertinent History Reviewed:  Reviewed past medical,surgical, social, obstetrical and family history.  Reviewed problem list, medications and allergies. OB History  Gravida Para Term Preterm AB Living  1       SAB IAB Ectopic Multiple Live Births          # Outcome Date GA Lbr Len/2nd Weight Sex Type Anes PTL Lv  1 Current            Physical Assessment:   Vitals:   07/27/23 1004  BP: 108/70  Pulse: 65  Weight: 152 lb (68.9 kg)  Body mass index is 25.29 kg/m.       Physical Examination:  General appearance - well appearing, and in no distress  Mental status - alert, oriented to person, place, and time  Psych:  She has a normal mood and affect  Skin - warm and dry, normal color, no suspicious lesions noted  Chest - effort normal, all lung fields clear to auscultation bilaterally  Heart - normal rate and regular rhythm  Abdomen - soft, nontender  Extremities:  No swelling or varicosities noted  Pelvic - VULVA: normal appearing vulva with no masses, tenderness or lesions  VAGINA: normal appearing vagina with normal color and discharge, no lesions  CERVIX: normal appearing cervix without discharge or lesions, no CMT  Thin prep pap is done without HR HPV cotesting  Chaperone: Peggy Dones    TODAY'S NT [place] 12+3 wks,measurements c/w dates,CRL 55.66 mm,NB present,NT 1.6 mm,normal ovaries,bicornuate uterus,FHR 159 bpm   No results found for this or any previous visit (from the past 24 hours).  Assessment & Plan:  1) Low-Risk Pregnancy G1P0 at [redacted]w[redacted]d with an Estimated Date of Delivery: 02/05/24   2) Initial OB visit  3) Hyperthyroid, managed by endo Ronny Bacon NP), currently on PTU 50mg  based on first trimester preg and labs in November, but has appt 12/30 for eval since she will be starting the second trimester  Meds: No orders of the defined types were placed in this encounter.   Initial labs obtained Continue prenatal vitamins Reviewed n/v relief measures and  warning s/s to report Reviewed recommended weight gain based on pre-gravid BMI Encouraged well-balanced diet Genetic & carrier screening discussed: requests Panorama and NT/IT, requests Horizon  Ultrasound discussed; fetal survey: requested CCNC completed> form faxed if has or is planning to apply for medicaid The nature of [facility] [employer] with multiple MDs and other Advanced Practice Providers was explained to patient; also emphasized that fellows, residents, and students are part of our team. Does not have home bp cuff. Office bp cuff given: yes. Rx sent: n/a. Check bp weekly, let us know if consistently >140/90.   No indications for ASA therapy (per uptodate)  Follow-up: Return for 4wk LROB & 2nd IT; 8wk LROB & anatomy u/s.   Orders Placed This Encounter  Procedures   Urine Culture   US OB Comp + 14 Wk   Integrated 1   Hemoglobin A1c   CBC/D/Plt+RPR+Rh+ABO+RubIgG...   PANORAMA PRENATAL TEST   HORIZON CUSTOM    Arabella Merles [facility] 07/27/2023 10:52 AM

## 2023-07-28 ENCOUNTER — Other Ambulatory Visit: Payer: Self-pay

## 2023-07-28 DIAGNOSIS — E0590 Thyrotoxicosis, unspecified without thyrotoxic crisis or storm: Secondary | ICD-10-CM

## 2023-07-28 LAB — CYTOLOGY - PAP
Adequacy: ABSENT
Chlamydia: NEGATIVE
Comment: NEGATIVE
Comment: NORMAL
Diagnosis: NEGATIVE
Neisseria Gonorrhea: NEGATIVE

## 2023-07-28 MED ORDER — PROPYLTHIOURACIL 50 MG PO TABS: 50.0000 mg | ORAL_TABLET | Freq: Two times a day (BID) | ORAL | 0 refills | Status: DC

## 2023-07-29 ENCOUNTER — Encounter: Payer: Self-pay | Admitting: Advanced Practice Midwife

## 2023-07-29 ENCOUNTER — Telehealth (INDEPENDENT_AMBULATORY_CARE_PROVIDER_SITE_OTHER): Payer: BC Managed Care – PPO | Admitting: Family

## 2023-07-29 ENCOUNTER — Encounter: Payer: Self-pay | Admitting: Family

## 2023-07-29 ENCOUNTER — Telehealth: Payer: Self-pay

## 2023-07-29 DIAGNOSIS — B3731 Acute candidiasis of vulva and vagina: Secondary | ICD-10-CM

## 2023-07-29 DIAGNOSIS — Z3401 Encounter for supervision of normal first pregnancy, first trimester: Secondary | ICD-10-CM

## 2023-07-29 LAB — CBC/D/PLT+RPR+RH+ABO+RUBIGG...
Antibody Screen: NEGATIVE
Basophils Absolute: 0 10*3/uL (ref 0.0–0.2)
Basos: 0 %
EOS (ABSOLUTE): 0.1 10*3/uL (ref 0.0–0.4)
Eos: 2 %
HCV Ab: NONREACTIVE
HIV Screen 4th Generation wRfx: NONREACTIVE
Hematocrit: 42.3 % (ref 34.0–46.6)
Hemoglobin: 14 g/dL (ref 11.1–15.9)
Hepatitis B Surface Ag: NEGATIVE
Immature Grans (Abs): 0 10*3/uL (ref 0.0–0.1)
Immature Granulocytes: 0 %
Lymphocytes Absolute: 1.5 10*3/uL (ref 0.7–3.1)
Lymphs: 28 %
MCH: 30.5 pg (ref 26.6–33.0)
MCHC: 33.1 g/dL (ref 31.5–35.7)
MCV: 92 fL (ref 79–97)
Monocytes Absolute: 0.4 10*3/uL (ref 0.1–0.9)
Monocytes: 7 %
Neutrophils Absolute: 3.3 10*3/uL (ref 1.4–7.0)
Neutrophils: 63 %
Platelets: 281 10*3/uL (ref 150–450)
RBC: 4.59 x10E6/uL (ref 3.77–5.28)
RDW: 12 % (ref 11.7–15.4)
RPR Ser Ql: NONREACTIVE
Rh Factor: POSITIVE
Rubella Antibodies, IGG: 1.85 {index} (ref >0.99)
WBC: 5.3 10*3/uL (ref 3.4–10.8)

## 2023-07-29 LAB — HEMOGLOBIN A1C
Est. average glucose Bld gHb Est-mCnc: 105 mg/dL
Hgb A1c MFr Bld: 5.3 % (ref 4.8–5.6)

## 2023-07-29 LAB — INTEGRATED 1
Crown Rump Length: 55.7 mm
Gest. Age on Collection Date: 12 wk
Maternal Age at EDD: 23.4 a
Nuchal Translucency (NT): 1.6 mm
Number of Fetuses: 1
PAPP-A Value: 833.2 ng/mL
Sonographer ID#: 309760
Weight: 152 [lb_av]

## 2023-07-29 LAB — HCV INTERPRETATION

## 2023-07-29 MED ORDER — TERCONAZOLE 0.4 % VA CREA: 1.0000 | TOPICAL_CREAM | Freq: Every day | VAGINAL | 2 refills | Status: AC

## 2023-07-29 NOTE — Telephone Encounter (Signed)
Apt scheduled.  

## 2023-07-29 NOTE — Progress Notes (Signed)
Virtual Visit Consent   Kristen Mejia, you are scheduled for a virtual visit with a [facility] provider today. Just as with appointments in the office, your consent must be obtained to participate. Your consent will be active for this visit and any virtual visit you may have with one of our providers in the next 365 days. If you have a MyChart account, a copy of this consent can be sent to you electronically.  As this is a virtual visit, video technology does not allow for your provider to perform a traditional examination. This may limit your provider's ability to fully assess your condition. If your provider identifies any concerns that need to be evaluated in person or the need to arrange testing (such as labs, EKG, etc.), we will make arrangements to do so. Although advances in technology are sophisticated, we cannot ensure that it will always work on either your end or our end. If the connection with a video visit is poor, the visit may have to be switched to a telephone visit. With either a video or telephone visit, we are not always able to ensure that we have a secure connection.  By engaging in this virtual visit, you consent to the provision of healthcare and authorize for your insurance to be billed (if applicable) for the services provided during this visit. Depending on your insurance coverage, you may receive a charge related to this service.  I need to obtain your verbal consent now. Are you willing to proceed with your visit today? Traci Peers has provided verbal consent on 07/29/2023 for a virtual visit (video or telephone). [name], FNP  Date: 07/29/2023 2:04 PM  Virtual Visit via Video Note   I, [name], connected with  Kristen Mejia  ([id number], [birth year]) on 07/29/23 at  1:50 PM EST by a video-enabled telemedicine application and verified that I am speaking with the correct person using two identifiers.  Location: Patient: Virtual Visit Location Patient:  Home Provider: Virtual Visit Location Provider: Home Office   I discussed the limitations of evaluation and management by telemedicine and the availability of in person appointments. The patient expressed understanding and agreed to proceed.    History of Present Illness: Kristen Mejia is a 22 y.o. who identifies as a female who was assigned female at birth, and is being seen today for urinary frequency that a few days ago. She had a pap at her GYN on 07/27/23 and shows fungal organisms present consistent with candida. She is currently [redacted] weeks pregnant.   HPI: HPI  Problems:  Patient Active Problem List   Diagnosis Date Noted   Supervision of normal first pregnancy 07/27/2023   Thyroiditis 02/02/2023   Nasal fracture 10/15/2022   Attention deficit hyperactivity disorder (ADHD), combined type 06/24/2022   Controlled substance agreement signed 06/24/2022   Irritable bowel syndrome with both constipation and diarrhea 10/20/2018   GAD (generalized anxiety disorder) 04/25/2018    Allergies: No Known Allergies Medications:  Current Outpatient Medications:    terconazole (TERAZOL 7) 0.4 % vaginal cream, Place 1 applicator vaginally at bedtime for 7 days., Disp: 45 g, Rfl: 2   Prenatal Vit-Fe Fumarate-FA (PRENATAL VITAMIN PO), Take by mouth., Disp: , Rfl:    propylthiouracil (PTU) 50 MG tablet, Take 1 tablet (50 mg total) by mouth 2 (two) times daily., Disp: 60 tablet, Rfl: 0   pyridOXINE (VITAMIN B6) 100 MG tablet, Take 100 mg by mouth daily., Disp: , Rfl:   Observations/Objective: Patient is well-developed, well-nourished in  no acute distress.  Resting comfortably  at home.  Head is normocephalic, atraumatic.  No labored breathing.  Speech is clear and coherent with logical content.  Patient is alert and oriented at baseline.    Assessment and Plan: 1. Vagina, candidiasis (Primary) - terconazole (TERAZOL 7) 0.4 % vaginal cream; Place 1 applicator vaginally at bedtime for 7 days.   Dispense: 45 g; Refill: 2  2. Encounter for supervision of normal first pregnancy in first trimester  Will avoid diflucan since patient is pregnant Keep clean and dry Avoid douche  Follow up if symptoms worsen or do not improve and keep follow up with GYN  Follow Up Instructions: I discussed the assessment and treatment plan with the patient. The patient was provided an opportunity to ask questions and all were answered. The patient agreed with the plan and demonstrated an understanding of the instructions.  A copy of instructions were sent to the patient via MyChart unless otherwise noted below.     The patient was advised to call back or seek an in-person evaluation if the symptoms worsen or if the condition fails to improve as anticipated.    [name], FNP

## 2023-07-29 NOTE — Telephone Encounter (Signed)
Copied from CRM [id number]. Topic: Clinical - Request for Lab/Test Order >> Jul 29, 2023 12:55 PM Clayton Bibles wrote: Reason for CRM: PT wants to come in today and complete a urine sample because she thinks she has a UTI. Please call her at [phone]   ntbs

## 2023-07-30 LAB — URINE CULTURE

## 2023-08-04 ENCOUNTER — Ambulatory Visit: Payer: BC Managed Care – PPO | Admitting: Nurse Practitioner

## 2023-08-04 LAB — PANORAMA PRENATAL TEST FULL PANEL:PANORAMA TEST PLUS 5 ADDITIONAL MICRODELETIONS: FETAL FRACTION: 7.9

## 2023-08-08 ENCOUNTER — Telehealth: Payer: Self-pay | Admitting: Family Medicine

## 2023-08-08 ENCOUNTER — Ambulatory Visit: Payer: BC Managed Care – PPO | Admitting: Nurse Practitioner

## 2023-08-08 DIAGNOSIS — Z8349 Family history of other endocrine, nutritional and metabolic diseases: Secondary | ICD-10-CM

## 2023-08-08 DIAGNOSIS — E0590 Thyrotoxicosis, unspecified without thyrotoxic crisis or storm: Secondary | ICD-10-CM

## 2023-08-08 LAB — HORIZON CUSTOM: REPORT SUMMARY: NEGATIVE

## 2023-08-08 NOTE — Telephone Encounter (Unsigned)
Copied from CRM [id number]. Topic: Appointments - Appointment Scheduling >> Aug 08, 2023 11:43 AM Carloyn Manner C wrote: Patient/patient representative is calling to schedule an appointment. Reached out to clinic to see if they had any sooner appointments other than 08/15/2023 at 8:10am [facility] advised that they did not and to advise the patient to go to her local urgent care to be seen. Provided patient with updated information offered to speak to Nurse Triage Line patient declined and advised she will be going to local urgent care.

## 2023-08-10 DIAGNOSIS — R1030 Lower abdominal pain, unspecified: Secondary | ICD-10-CM | POA: Diagnosis not present

## 2023-08-10 DIAGNOSIS — O99891 Other specified diseases and conditions complicating pregnancy: Secondary | ICD-10-CM | POA: Diagnosis not present

## 2023-08-10 DIAGNOSIS — R110 Nausea: Secondary | ICD-10-CM | POA: Diagnosis not present

## 2023-08-10 DIAGNOSIS — Z3A15 15 weeks gestation of pregnancy: Secondary | ICD-10-CM | POA: Diagnosis not present

## 2023-08-10 NOTE — L&D Delivery Note (Signed)
 OB/GYN Faculty Practice Delivery Note  Kristen Mejia is a 23 y.o. G1P0 s/p SVD at [redacted]w[redacted]d. She was admitted for IOL mild PreE.   ROM: 3h 60m with clear fluid GBS Status:  Negative/-- (06/02 1543) Maximum Maternal Temperature: 98.57F  Labor Progress: Initial SVE: 1/80/-3. She received pitocin  and AROM. She then progressed to complete.   Delivery Date/Time: 0500 6/7 Delivery: Called to room and patient was complete and pushing. Head delivered ROA. Nuchal cord present. Shoulder and body delivered in usual fashion. Nuchal easily reduced. Infant with spontaneous cry, placed on mother's abdomen, dried and stimulated. Cord clamped x 2 after 1-minute delay, and cut by FOB. Cord blood drawn. Placenta delivered spontaneously with gentle cord traction. Fundus firm with massage and Pitocin . Labia, perineum, vagina, and cervix inspected without laceration. Mom and baby doing well.   Baby Weight: pending  Placenta: 3 vessel, intact. Sent to L&D Complications: None Lacerations: None EBL: 200 mL Analgesia: Epidural   Infant:  APGAR (1 MIN): 7  APGAR (5 MINS): 8   Ebony Goldstein, MD [facility] Fellow, [facility] for [facility], [facility] 01/14/2024, 5:22 AM

## 2023-08-15 ENCOUNTER — Telehealth: Payer: BC Managed Care – PPO | Admitting: Family

## 2023-08-16 ENCOUNTER — Telehealth (INDEPENDENT_AMBULATORY_CARE_PROVIDER_SITE_OTHER): Payer: BC Managed Care – PPO | Admitting: Family

## 2023-08-16 ENCOUNTER — Encounter: Payer: Self-pay | Admitting: Family

## 2023-08-16 DIAGNOSIS — J069 Acute upper respiratory infection, unspecified: Secondary | ICD-10-CM

## 2023-08-16 DIAGNOSIS — Z3A15 15 weeks gestation of pregnancy: Secondary | ICD-10-CM

## 2023-08-16 MED ORDER — FLUTICASONE PROPIONATE 50 MCG/ACT NA SUSP: 2.0000 | Freq: Every day | NASAL | 6 refills | Status: DC

## 2023-08-16 MED ORDER — CETIRIZINE HCL 10 MG PO TABS: 10.0000 mg | ORAL_TABLET | Freq: Every day | ORAL | 1 refills | Status: DC

## 2023-08-16 NOTE — Patient Instructions (Signed)

## 2023-08-16 NOTE — Progress Notes (Signed)
 Virtual Visit Consent   Kristen Mejia, you are scheduled for a virtual visit with a [facility] provider today. Just as with appointments in the office, your consent must be obtained to participate. Your consent will be active for this visit and any virtual visit you may have with one of our providers in the next 365 days. If you have a MyChart account, a copy of this consent can be sent to you electronically.  As this is a virtual visit, video technology does not allow for your provider to perform a traditional examination. This may limit your provider's ability to fully assess your condition. If your provider identifies any concerns that need to be evaluated in person or the need to arrange testing (such as labs, EKG, etc.), we will make arrangements to do so. Although advances in technology are sophisticated, we cannot ensure that it will always work on either your end or our end. If the connection with a video visit is poor, the visit may have to be switched to a telephone visit. With either a video or telephone visit, we are not always able to ensure that we have a secure connection.  By engaging in this virtual visit, you consent to the provision of healthcare and authorize for your insurance to be billed (if applicable) for the services provided during this visit. Depending on your insurance coverage, you may receive a charge related to this service.  I need to obtain your verbal consent now. Are you willing to proceed with your visit today? Kristen Mejia has provided verbal consent on 08/16/2023 for a virtual visit (video or telephone). Bari Learn, FNP  Date: 08/16/2023 8:16 AM  Virtual Visit via Video Note   I, Bari Learn, connected with  Kristen Mejia  ([id number], [birth year]) on 08/16/23 at  8:10 AM EST by a video-enabled telemedicine application and verified that I am speaking with the correct person using two identifiers.  Location: Patient: Virtual Visit Location Patient:  Home Provider: Virtual Visit Location Provider: Home Office   I discussed the limitations of evaluation and management by telemedicine and the availability of in person appointments. The patient expressed understanding and agreed to proceed.    History of Present Illness: Kristen Mejia is a 23 y.o. who identifies as a female who was assigned female at birth, and is being seen today for sore throat, congestion, and cough that started couple days ago. She is [redacted] weeks pregnant.   HPI: URI  This is a new problem. The current episode started in the past 7 days. The problem has been unchanged. There has been no fever. Associated symptoms include congestion, coughing, headaches, rhinorrhea, sinus pain, sneezing and a sore throat. Pertinent negatives include no ear pain (ear congestion) or swollen glands. She has tried increased fluids for the symptoms. The treatment provided mild relief.    Problems:  Patient Active Problem List   Diagnosis Date Noted   Supervision of normal first pregnancy 07/27/2023   Thyroiditis 02/02/2023   Nasal fracture 10/15/2022   Attention deficit hyperactivity disorder (ADHD), combined type 06/24/2022   Controlled substance agreement signed 06/24/2022   Irritable bowel syndrome with both constipation and diarrhea 10/20/2018   GAD (generalized anxiety disorder) 04/25/2018    Allergies: No Known Allergies Medications:  Current Outpatient Medications:    cetirizine  (ZYRTEC  ALLERGY ) 10 MG tablet, Take 1 tablet (10 mg total) by mouth daily., Disp: 90 tablet, Rfl: 1   fluticasone  (FLONASE ) 50 MCG/ACT nasal spray, Place 2 sprays into both nostrils daily.,  Disp: 16 g, Rfl: 6   Prenatal Vit-Fe Fumarate-FA (PRENATAL VITAMIN PO), Take by mouth., Disp: , Rfl:    propylthiouracil  (PTU) 50 MG tablet, Take 1 tablet (50 mg total) by mouth 2 (two) times daily., Disp: 60 tablet, Rfl: 0   pyridOXINE  (VITAMIN B6) 100 MG tablet, Take 100 mg by mouth daily., Disp: , Rfl:    Observations/Objective: Patient is well-developed, well-nourished in no acute distress.  Resting comfortably  at home.  Head is normocephalic, atraumatic.  No labored breathing.  Speech is clear and coherent with logical content.  Patient is alert and oriented at baseline.    Assessment and Plan: 1. Viral URI (Primary) - cetirizine  (ZYRTEC  ALLERGY ) 10 MG tablet; Take 1 tablet (10 mg total) by mouth daily.  Dispense: 90 tablet; Refill: 1 - fluticasone  (FLONASE ) 50 MCG/ACT nasal spray; Place 2 sprays into both nostrils daily.  Dispense: 16 g; Refill: 6  - Take meds as prescribed - Use a cool mist humidifier  -Use saline nose sprays frequently -Force fluids -For any cough or congestion  Use plain Mucinex- regular strength or max strength is fine -For fever or aces or pains- take tylenol  or ibuprofen . -Throat lozenges if help -Follow up if symptoms worsen or do not improve   Follow Up Instructions: I discussed the assessment and treatment plan with the patient. The patient was provided an opportunity to ask questions and all were answered. The patient agreed with the plan and demonstrated an understanding of the instructions.  A copy of instructions were sent to the patient via MyChart unless otherwise noted below.    The patient was advised to call back or seek an in-person evaluation if the symptoms worsen or if the condition fails to improve as anticipated.    Bari Learn, FNP

## 2023-08-18 ENCOUNTER — Ambulatory Visit: Payer: BC Managed Care – PPO | Admitting: Family

## 2023-08-19 ENCOUNTER — Encounter: Payer: Self-pay | Admitting: Family

## 2023-08-22 ENCOUNTER — Telehealth: Payer: Self-pay | Admitting: *Deleted

## 2023-08-22 NOTE — Telephone Encounter (Signed)
 Pt is pregnant and is cramping. Pt had a stressful day yesterday and that's when the cramping started. No spotting or bleeding. Pt is drinking 2-3 bottles of water a day. I advised to increase water intake to 6-8 bottles of water a day and try Tylenol . Has an appt on Wednesday. Pt was advised to let us  know if anything changes and keep appt for Wednesday. Pt voiced understanding. JSY

## 2023-08-23 ENCOUNTER — Encounter: Payer: Self-pay | Admitting: Women's Health

## 2023-08-24 ENCOUNTER — Encounter: Payer: Self-pay | Admitting: Nurse Practitioner

## 2023-08-24 ENCOUNTER — Encounter: Payer: Self-pay | Admitting: Women's Health

## 2023-08-24 ENCOUNTER — Ambulatory Visit (INDEPENDENT_AMBULATORY_CARE_PROVIDER_SITE_OTHER): Payer: BC Managed Care – PPO | Admitting: Women's Health

## 2023-08-24 VITALS — BP 136/87 | HR 93 | Wt 156.0 lb

## 2023-08-24 DIAGNOSIS — Z3A16 16 weeks gestation of pregnancy: Secondary | ICD-10-CM

## 2023-08-24 DIAGNOSIS — O0990 Supervision of high risk pregnancy, unspecified, unspecified trimester: Secondary | ICD-10-CM

## 2023-08-24 DIAGNOSIS — O0992 Supervision of high risk pregnancy, unspecified, second trimester: Secondary | ICD-10-CM

## 2023-08-24 DIAGNOSIS — O99282 Endocrine, nutritional and metabolic diseases complicating pregnancy, second trimester: Secondary | ICD-10-CM

## 2023-08-24 DIAGNOSIS — Z1379 Encounter for other screening for genetic and chromosomal anomalies: Secondary | ICD-10-CM

## 2023-08-24 DIAGNOSIS — E0590 Thyrotoxicosis, unspecified without thyrotoxic crisis or storm: Secondary | ICD-10-CM

## 2023-08-24 NOTE — Patient Instructions (Signed)
Teandra, thank you for choosing our office today! We appreciate the opportunity to meet your healthcare needs. You may receive a short survey by mail, e-mail, or through MyChart. If you are happy with your care we would appreciate if you could take just a few minutes to complete the survey questions. We read all of your comments and take your feedback very seriously. Thank you again for choosing our office.  [facility] Team at [facility] ([address] [place], [place] [address]) Entrance C, located off of [address] Free 24/7 valet parking  Go to [email or web address] to register for FREE online childbirth classes  Call the office [phone]) or go to [facility] if: You begin to severe cramping Your water breaks.  Sometimes it is a big gush of fluid, sometimes it is just a trickle that keeps getting your panties wet or running down your legs You have vaginal bleeding.  It is normal to have a small amount of spotting if your cervix was checked.   [place] [facility] Doctors [facility] ([facility]): [address], [phone]           [facility]: [address], [phone]                [facility] ([facility]): [address], [phone] (call to ask if accepting patients) [facility]: [address], [place], [phone]    [place] [facility]/Family Doctors [facility] ([facility]): [address], [phone] [facility]: [address], [phone] [facility]: [address], [phone]  [place] [facility]  [facility] ([facility]): [phone] [facility]: [address], [phone]   [place] [facility]: [address], [phone]  [place] [facility]  [facility]: [address], [phone]  Home Blood Pressure Monitoring for Patients   Your provider has recommended that you check your blood pressure (BP) at least once a week at home. If you do not have a blood pressure cuff at home, one will be provided for you. Contact your provider if you have not received your monitor within 1 week.   Helpful Tips for Accurate Home Blood Pressure Checks  Don't smoke, exercise, or drink caffeine 30 minutes before checking your BP Use the restroom before checking your BP (a full bladder can raise your pressure) Relax in a comfortable upright chair Feet on the ground Left arm resting comfortably on a flat surface at the level of your heart Legs uncrossed Back supported Sit quietly and don't talk Place the cuff on your bare arm Adjust snuggly, so that only two fingertips can fit between your skin and the top of the cuff Check 2 readings separated by at least one minute Keep a log of your BP readings For a visual, please reference this diagram: http://ccnc.care/bpdiagram  Provider Name: [facility]     Phone: [phone]  Zone 1: ALL CLEAR  Continue to monitor your symptoms:  BP reading is less than 140 (top number) or less than 90 (bottom number)  No right upper stomach pain No headaches or seeing spots No feeling nauseated or throwing up No swelling in face and hands  Zone 2: CAUTION Call your doctor's office for any of the following:  BP reading is greater than 140 (top number) or greater than   90 (bottom number)  Stomach pain under your ribs in the middle or right side Headaches or seeing spots Feeling nauseated or throwing up Swelling in face and hands  Zone 3: EMERGENCY  Seek immediate medical care if you have any of the following:  BP reading is greater than160 (top number) or greater than 110 (bottom number) Severe headaches not improving with Tylenol Serious difficulty catching your breath Any worsening symptoms from  Zone 2     Second Trimester of Pregnancy The second trimester is from week 14 through week 27 (months 4 through 6). The second trimester is often a time when you feel your best. Your body has adjusted to being pregnant, and you begin to feel better physically. Usually, morning sickness has lessened or quit completely, you may have more energy, and you may have an increase in appetite. The second trimester is also a time when the fetus is growing rapidly. At the end of the sixth month, the fetus is about 9 inches long and weighs about 1 pounds. You will likely begin to feel the baby move (quickening) between 16 and 20 weeks of pregnancy. Body changes during your second trimester Your body continues to go through many changes during your second trimester. The changes vary from woman to woman. Your weight will continue to increase. You will notice your lower abdomen bulging out. You may begin to get stretch marks on your hips, abdomen, and breasts. You may develop headaches that can be relieved by medicines. The medicines should be approved by your health care provider. You may urinate more often because the fetus is pressing on your bladder. You may develop or continue to have heartburn as a result of your pregnancy. You may develop constipation because certain hormones are causing the muscles that push waste through your intestines to slow down. You may develop hemorrhoids or swollen, bulging veins (varicose veins). You may have back pain. This is caused by: Weight gain. Pregnancy hormones that are relaxing the joints in your pelvis. A shift in weight and the muscles that support your balance. Your breasts will continue to grow and they will continue to become tender. Your gums may bleed and may be sensitive to brushing and flossing. Dark spots or blotches (chloasma, mask of pregnancy) may develop on your face. This will likely fade after the baby is born. A dark line from your belly button to  the pubic area (linea nigra) may appear. This will likely fade after the baby is born. You may have changes in your hair. These can include thickening of your hair, rapid growth, and changes in texture. Some women also have hair loss during or after pregnancy, or hair that feels dry or thin. Your hair will most likely return to normal after your baby is born.  What to expect at prenatal visits During a routine prenatal visit: You will be weighed to make sure you and the fetus are growing normally. Your blood pressure will be taken. Your abdomen will be measured to track your baby's growth. The fetal heartbeat will be listened to. Any test results from the previous visit will be discussed.  Your health care provider may ask you: How you are feeling. If you are feeling the baby move. If you have had any abnormal symptoms, such as leaking fluid, bleeding, severe headaches, or abdominal cramping. If you are using any tobacco products, including cigarettes, chewing tobacco, and electronic cigarettes. If you have any questions.  Other tests that may be performed during  your second trimester include: Blood tests that check for: Low iron levels (anemia). High blood sugar that affects pregnant women (gestational diabetes) between 41 and 28 weeks. Rh antibodies. This is to check for a protein on red blood cells (Rh factor). Urine tests to check for infections, diabetes, or protein in the urine. An ultrasound to confirm the proper growth and development of the baby. An amniocentesis to check for possible genetic problems. Fetal screens for spina bifida and Down syndrome. HIV (human immunodeficiency virus) testing. Routine prenatal testing includes screening for HIV, unless you choose not to have this test.  Follow these instructions at home: Medicines Follow your health care provider's instructions regarding medicine use. Specific medicines may be either safe or unsafe to take during  pregnancy. Take a prenatal vitamin that contains at least 600 micrograms (mcg) of folic acid. If you develop constipation, try taking a stool softener if your health care provider approves. Eating and drinking Eat a balanced diet that includes fresh fruits and vegetables, whole grains, good sources of protein such as meat, eggs, or tofu, and low-fat dairy. Your health care provider will help you determine the amount of weight gain that is right for you. Avoid raw meat and uncooked cheese. These carry germs that can cause birth defects in the baby. If you have low calcium intake from food, talk to your health care provider about whether you should take a daily calcium supplement. Limit foods that are high in fat and processed sugars, such as fried and sweet foods. To prevent constipation: Drink enough fluid to keep your urine clear or pale yellow. Eat foods that are high in fiber, such as fresh fruits and vegetables, whole grains, and beans. Activity Exercise only as directed by your health care provider. Most women can continue their usual exercise routine during pregnancy. Try to exercise for 30 minutes at least 5 days a week. Stop exercising if you experience uterine contractions. Avoid heavy lifting, wear low heel shoes, and practice good posture. A sexual relationship may be continued unless your health care provider directs you otherwise. Relieving pain and discomfort Wear a good support bra to prevent discomfort from breast tenderness. Take warm sitz baths to soothe any pain or discomfort caused by hemorrhoids. Use hemorrhoid cream if your health care provider approves. Rest with your legs elevated if you have leg cramps or low back pain. If you develop varicose veins, wear support hose. Elevate your feet for 15 minutes, 3-4 times a day. Limit salt in your diet. Prenatal Care Write down your questions. Take them to your prenatal visits. Keep all your prenatal visits as told by your health  care provider. This is important. Safety Wear your seat belt at all times when driving. Make a list of emergency phone numbers, including numbers for family, friends, the hospital, and police and fire departments. General instructions Ask your health care provider for a referral to a local prenatal education class. Begin classes no later than the beginning of month 6 of your pregnancy. Ask for help if you have counseling or nutritional needs during pregnancy. Your health care provider can offer advice or refer you to specialists for help with various needs. Do not use hot tubs, steam rooms, or saunas. Do not douche or use tampons or scented sanitary pads. Do not cross your legs for long periods of time. Avoid cat litter boxes and soil used by cats. These carry germs that can cause birth defects in the baby and possibly loss of the  fetus by miscarriage or stillbirth. Avoid all smoking, herbs, alcohol, and unprescribed drugs. Chemicals in these products can affect the formation and growth of the baby. Do not use any products that contain nicotine or tobacco, such as cigarettes and e-cigarettes. If you need help quitting, ask your health care provider. Visit your dentist if you have not gone yet during your pregnancy. Use a soft toothbrush to brush your teeth and be gentle when you floss. Contact a health care provider if: You have dizziness. You have mild pelvic cramps, pelvic pressure, or nagging pain in the abdominal area. You have persistent nausea, vomiting, or diarrhea. You have a bad smelling vaginal discharge. You have pain when you urinate. Get help right away if: You have a fever. You are leaking fluid from your vagina. You have spotting or bleeding from your vagina. You have severe abdominal cramping or pain. You have rapid weight gain or weight loss. You have shortness of breath with chest pain. You notice sudden or extreme swelling of your face, hands, ankles, feet, or legs. You  have not felt your baby move in over an hour. You have severe headaches that do not go away when you take medicine. You have vision changes. Summary The second trimester is from week 14 through week 27 (months 4 through 6). It is also a time when the fetus is growing rapidly. Your body goes through many changes during pregnancy. The changes vary from woman to woman. Avoid all smoking, herbs, alcohol, and unprescribed drugs. These chemicals affect the formation and growth your baby. Do not use any tobacco products, such as cigarettes, chewing tobacco, and e-cigarettes. If you need help quitting, ask your health care provider. Contact your health care provider if you have any questions. Keep all prenatal visits as told by your health care provider. This is important. This information is not intended to replace advice given to you by your health care provider. Make sure you discuss any questions you have with your health care provider. Document Released: 07/20/2001 Document Revised: 01/01/2016 Document Reviewed: 09/26/2012 Elsevier Interactive Patient Education  2017 [employer].

## 2023-08-24 NOTE — Progress Notes (Signed)
 HIGH-RISK PREGNANCY VISIT Patient name: Guy Canet MRN [id number]  Date of birth: [birth year] Chief Complaint:   Routine Prenatal Visit (2nd IT)  History of Present Illness:   Kristen Mejia is a 23 y.o. G1P0 female at [redacted]w[redacted]d with an Estimated Date of Delivery: 02/05/24 being seen today for ongoing management of a high-risk pregnancy complicated by hyperthyroidism on meds.    Today she reports no complaints. Contractions: Not present.  .  Movement: Absent. denies leaking of fluid.      07/27/2023   10:28 AM 06/10/2023    9:22 AM 05/19/2023    2:19 PM 02/07/2023    2:28 PM 01/10/2023    3:17 PM  Depression screen PHQ 2/9  Decreased Interest 0 0 0 0 0  Down, Depressed, Hopeless 0 0 0 0 0  PHQ - 2 Score 0 0 0 0 0  Altered sleeping 2 0  0 0  Tired, decreased energy 3 0  0 0  Change in appetite 2 0  0 0  Feeling bad or failure about yourself  0 0  0 0  Trouble concentrating 0 0  0 0  Moving slowly or fidgety/restless 0 0  0 0  Suicidal thoughts 0 0  0 0  PHQ-9 Score 7 0  0 0  Difficult doing work/chores  Not difficult at all  Not difficult at all Not difficult at all        07/27/2023   10:29 AM 06/10/2023    9:22 AM 02/07/2023    2:28 PM 01/10/2023    3:17 PM  GAD 7 : Generalized Anxiety Score  Nervous, Anxious, on Edge 0 0 0 0  Control/stop worrying 0 0 0 0  Worry too much - different things 0 0 0 0  Trouble relaxing 0 0 0 0  Restless 0 0 0 0  Easily annoyed or irritable 0 0 0 0  Afraid - awful might happen 0 0 0 0  Total GAD 7 Score 0 0 0 0  Anxiety Difficulty  Not difficult at all Not difficult at all Not difficult at all     Review of Systems:   Pertinent items are noted in HPI Denies abnormal vaginal discharge w/ itching/odor/irritation, headaches, visual changes, shortness of breath, chest pain, abdominal pain, severe nausea/vomiting, or problems with urination or bowel movements unless otherwise stated above. Pertinent History Reviewed:  Reviewed past medical,surgical,  social, obstetrical and family history.  Reviewed problem list, medications and allergies. Physical Assessment:   Vitals:   08/24/23 1037  BP: 136/87  Pulse: 93  Weight: 156 lb (70.8 kg)  Body mass index is 25.96 kg/m.           Physical Examination:   General appearance: alert, well appearing, and in no distress  Mental status: alert, oriented to person, place, and time  Skin: warm & dry   Extremities: Edema: None    Cardiovascular: normal heart rate noted  Respiratory: normal respiratory effort, no distress  Abdomen: gravid, soft, non-tender  Pelvic: Cervical exam deferred         Fetal Status: Fetal Heart Rate (bpm): 158   Movement: Absent    Fetal Surveillance Testing today: doppler   Chaperone: N/A    No results found for this or any previous visit (from the past 24 hours).  Assessment & Plan:  High-risk pregnancy: G1P0 at [redacted]w[redacted]d with an Estimated Date of Delivery: 02/05/24   1) Hyperthyroidism, on PTU 50mg  BID, last labs in Nov wnl, to  call endocrinologist for f/u appt, send me MyChart message to let me know when it is  Meds: No orders of the defined types were placed in this encounter.  Labs/procedures today: 2nd IT  Treatment Plan: U/S q4w @ 24w    2x/wk nst or weekly bpp @ 32wks    Deliver @ 39wks (well controlled) or as per MFM   Reviewed: Preterm labor symptoms and general obstetric precautions including but not limited to vaginal bleeding, contractions, leaking of fluid and fetal movement were reviewed in detail with the patient.  All questions were answered. Does have home bp cuff. Office bp cuff given: not applicable. Check bp weekly, let us  know if consistently >140 and/or >90.  Follow-up: Return for As scheduled.   Future Appointments  Date Time Provider Department Center  09/21/2023  8:30 AM [facility]  [facility] None  09/21/2023  9:50 AM Ferd Householder, CNM [facility]    Orders Placed This Encounter  Procedures   INTEGRATED 2    Ferd Householder CNM, [facility] 08/24/2023 11:08 AM

## 2023-08-26 LAB — INTEGRATED 2
AFP MoM: 1.89
Alpha-Fetoprotein: 58.5 ng/mL
Crown Rump Length: 55.7 mm
DIA MoM: 2.69
DIA Value: 441.1 pg/mL
Estriol, Unconjugated: 0.66 ng/mL
Gest. Age on Collection Date: 12 wk
Gestational Age: 16 wk
Maternal Age at EDD: 23.4 a
Nuchal Translucency (NT): 1.6 mm
Nuchal Translucency MoM: 1.02
Number of Fetuses: 1
PAPP-A MoM: 1.03
PAPP-A Value: 833.2 ng/mL
Sonographer ID#: 309760
Test Results:: NEGATIVE
Weight: 152 [lb_av]
Weight: 152 [lb_av]
hCG MoM: 3.79
hCG Value: 138.6 [IU]/mL
uE3 MoM: 0.8

## 2023-08-29 ENCOUNTER — Encounter: Payer: Self-pay | Admitting: Women's Health

## 2023-08-29 ENCOUNTER — Other Ambulatory Visit: Payer: Self-pay | Admitting: Nurse Practitioner

## 2023-08-29 DIAGNOSIS — E0590 Thyrotoxicosis, unspecified without thyrotoxic crisis or storm: Secondary | ICD-10-CM

## 2023-08-30 NOTE — Telephone Encounter (Signed)
Temporary refill, like 15 days.  She needs to be seen here ASAP for the health of her baby

## 2023-08-31 NOTE — Telephone Encounter (Signed)
I spoke with patient and made her aware that per Whitney she needs to complete her labs and see Whitney next week. I also let her know that a 15 day supply was sent in with no refills until seen.

## 2023-08-31 NOTE — Telephone Encounter (Signed)
We really need to reach out and have her get her labs done and follow up here soon

## 2023-09-06 ENCOUNTER — Telehealth: Payer: Self-pay

## 2023-09-06 ENCOUNTER — Telehealth: Payer: Self-pay | Admitting: *Deleted

## 2023-09-06 ENCOUNTER — Ambulatory Visit: Payer: BC Managed Care – PPO | Admitting: Nurse Practitioner

## 2023-09-06 DIAGNOSIS — E0590 Thyrotoxicosis, unspecified without thyrotoxic crisis or storm: Secondary | ICD-10-CM

## 2023-09-06 NOTE — Telephone Encounter (Signed)
Lab orders entered as requested

## 2023-09-06 NOTE — Telephone Encounter (Signed)
Copied from CRM [id number]. Topic: Clinical - Request for Lab/Test Order >> Sep 06, 2023  8:59 AM Desma Mcgregor wrote: Reason for CRM: Patient requesting lab order for blood work to check thyroid.

## 2023-09-06 NOTE — Telephone Encounter (Signed)
Patient left a voicemail.She has appointment at 10:30 am today. She has to be at work at 10 am. She is asking if she can make appointment for next week, and that she still needs to get her lab work done. Front desk has already taken care of this.

## 2023-09-06 NOTE — Addendum Note (Signed)
Addended by: Diamantina Monks on: 09/06/2023 04:47 PM   Modules accepted: Orders

## 2023-09-08 ENCOUNTER — Ambulatory Visit: Payer: BC Managed Care – PPO

## 2023-09-09 ENCOUNTER — Other Ambulatory Visit: Payer: BC Managed Care – PPO

## 2023-09-09 ENCOUNTER — Ambulatory Visit (INDEPENDENT_AMBULATORY_CARE_PROVIDER_SITE_OTHER): Payer: BC Managed Care – PPO | Admitting: Family Medicine

## 2023-09-09 DIAGNOSIS — E0590 Thyrotoxicosis, unspecified without thyrotoxic crisis or storm: Secondary | ICD-10-CM

## 2023-09-09 DIAGNOSIS — Z111 Encounter for screening for respiratory tuberculosis: Secondary | ICD-10-CM | POA: Diagnosis not present

## 2023-09-10 LAB — TSH+FREE T4
Free T4: 1.32 ng/dL (ref 0.82–1.77)
TSH: <0.005 u[IU]/mL — ABNORMAL LOW (ref 0.450–4.500)

## 2023-09-12 ENCOUNTER — Ambulatory Visit (INDEPENDENT_AMBULATORY_CARE_PROVIDER_SITE_OTHER): Payer: BC Managed Care – PPO | Admitting: "Endocrinology

## 2023-09-12 ENCOUNTER — Ambulatory Visit: Payer: BC Managed Care – PPO | Admitting: Family Medicine

## 2023-09-12 ENCOUNTER — Encounter: Payer: Self-pay | Admitting: "Endocrinology

## 2023-09-12 VITALS — BP 116/78 | HR 72 | Ht 65.0 in | Wt 156.4 lb

## 2023-09-12 DIAGNOSIS — E0590 Thyrotoxicosis, unspecified without thyrotoxic crisis or storm: Secondary | ICD-10-CM | POA: Diagnosis not present

## 2023-09-12 DIAGNOSIS — Z8349 Family history of other endocrine, nutritional and metabolic diseases: Secondary | ICD-10-CM

## 2023-09-12 LAB — TB SKIN TEST
Induration: 0 mm
TB Skin Test: NEGATIVE

## 2023-09-12 MED ORDER — METHIMAZOLE 5 MG PO TABS: 5.0000 mg | ORAL_TABLET | Freq: Every day | ORAL | 3 refills | Status: DC
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
## 2023-09-12 NOTE — Progress Notes (Signed)
PATIENT TB READ IS NEGATIVE.

## 2023-09-16 ENCOUNTER — Encounter: Payer: Self-pay | Admitting: Women's Health

## 2023-09-16 ENCOUNTER — Encounter: Payer: Self-pay | Admitting: Obstetrics & Gynecology

## 2023-09-16 ENCOUNTER — Ambulatory Visit (INDEPENDENT_AMBULATORY_CARE_PROVIDER_SITE_OTHER): Payer: BC Managed Care – PPO | Admitting: Obstetrics & Gynecology

## 2023-09-16 ENCOUNTER — Ambulatory Visit (INDEPENDENT_AMBULATORY_CARE_PROVIDER_SITE_OTHER): Payer: BC Managed Care – PPO

## 2023-09-16 VITALS — BP 132/89 | HR 88 | Wt 158.2 lb

## 2023-09-16 DIAGNOSIS — O0990 Supervision of high risk pregnancy, unspecified, unspecified trimester: Secondary | ICD-10-CM

## 2023-09-16 DIAGNOSIS — Z363 Encounter for antenatal screening for malformations: Secondary | ICD-10-CM | POA: Diagnosis not present

## 2023-09-16 DIAGNOSIS — O0992 Supervision of high risk pregnancy, unspecified, second trimester: Secondary | ICD-10-CM | POA: Diagnosis not present

## 2023-09-16 DIAGNOSIS — Z3A19 19 weeks gestation of pregnancy: Secondary | ICD-10-CM

## 2023-09-16 DIAGNOSIS — E0590 Thyrotoxicosis, unspecified without thyrotoxic crisis or storm: Secondary | ICD-10-CM | POA: Diagnosis not present

## 2023-09-16 NOTE — Progress Notes (Signed)
 US  19+5 wks,cephalic,FHR 140 bpm,cx 3.4 cm,anterior placenta gr 0,SVP of fluid 7 cm,normal ovaries,EFW 349 g 80%,anatomy complete,no obvious abnormalities

## 2023-09-16 NOTE — Progress Notes (Signed)
 HIGH-RISK PREGNANCY VISIT Patient name: Kristen Mejia MRN [id number]  Date of birth: 2001/05/17 Chief Complaint:   Routine Prenatal Visit  History of Present Illness:   Kristen Mejia is a 23 y.o. G1P0 female at [redacted]w[redacted]d with an Estimated Date of Delivery: 02/05/24 being seen today for ongoing management of a high-risk pregnancy complicated by :  -Hyperthyroidism Transitioned from PTU to methimazole  5 mg daily Next blood work 3/4 and appt 3/11  Today she reports no complaints.   Contractions: Not present. Vag. Bleeding: None.  Movement: Present. denies leaking of fluid.      07/27/2023   10:28 AM 06/10/2023    9:22 AM 05/19/2023    2:19 PM 02/07/2023    2:28 PM 01/10/2023    3:17 PM  Depression screen PHQ 2/9  Decreased Interest 0 0 0 0 0  Down, Depressed, Hopeless 0 0 0 0 0  PHQ - 2 Score 0 0 0 0 0  Altered sleeping 2 0  0 0  Tired, decreased energy 3 0  0 0  Change in appetite 2 0  0 0  Feeling bad or failure about yourself  0 0  0 0  Trouble concentrating 0 0  0 0  Moving slowly or fidgety/restless 0 0  0 0  Suicidal thoughts 0 0  0 0  PHQ-9 Score 7 0  0 0  Difficult doing work/chores  Not difficult at all  Not difficult at all Not difficult at all     Current Outpatient Medications  Medication Instructions   methimazole  (TAPAZOLE ) 5 mg, Oral, Daily with breakfast   Prenatal Vit-Fe Fumarate-FA (PRENATAL VITAMIN PO) Take by mouth.   pyridOXINE  (VITAMIN B6) 100 mg, Daily     Review of Systems:   Pertinent items are noted in HPI Denies abnormal vaginal discharge w/ itching/odor/irritation, headaches, visual changes, shortness of breath, chest pain, abdominal pain, severe nausea/vomiting, or problems with urination or bowel movements unless otherwise stated above. Pertinent History Reviewed:  Reviewed past medical,surgical, social, obstetrical and family history.  Reviewed problem list, medications and allergies. Physical Assessment:   Vitals:   09/16/23 0936  BP: 132/89   Pulse: 88  Weight: 158 lb 3.2 oz (71.8 kg)  Body mass index is 26.33 kg/m.           Physical Examination:   General appearance: alert, well appearing, and in no distress  Mental status: normal mood, behavior, speech, dress, motor activity, and thought processes  Skin: warm & dry   Extremities:      Cardiovascular: normal heart rate noted  Respiratory: normal respiratory effort, no distress  Abdomen: gravid, soft, non-tender  Pelvic: Cervical exam deferred         Fetal Status:     Movement: Present    Fetal Surveillance Testing today: ,cephalic,FHR 140 bpm,cx 3.4 cm,anterior placenta gr 0,SVP of fluid 7 cm,normal ovaries,EFW 349 g 80%,anatomy complete,no obvious abnormalities    Chaperone: N/A    No results found for this or any previous visit (from the past 24 hours).   Assessment & Plan:  High-risk pregnancy: G1P0 at [redacted]w[redacted]d with an Estimated Date of Delivery: 02/05/24   1) Hyperthyroidism -currently on methimazole  5mg  daily []  lab work q 4wks  -anatomy scan today- no abnormalities noted  Meds: No orders of the defined types were placed in this encounter.   Labs/procedures today: anatomy scan  Treatment Plan:  routine OB care  Reviewed: Preterm labor symptoms and general obstetric precautions including but not limited  to vaginal bleeding, contractions, leaking of fluid and fetal movement were reviewed in detail with the patient.  All questions were answered.   Follow-up: Return in about 4 weeks (around 10/14/2023) for HROB visit (ok for CNM).   Future Appointments  Date Time Provider Department Center  10/14/2023 10:50 AM Marilynn Nest, DO [facility]  10/17/2023 11:00 AM Therisa Benton PARAS, NP [facility] None    No orders of the defined types were placed in this encounter.   Alexi Dorminey, DO Attending Obstetrician & Gynecologist, [facility] for [employer], [facility]

## 2023-09-21 ENCOUNTER — Encounter: Payer: BC Managed Care – PPO | Admitting: Women's Health

## 2023-09-21 ENCOUNTER — Other Ambulatory Visit: Payer: BC Managed Care – PPO

## 2023-10-10 ENCOUNTER — Ambulatory Visit (INDEPENDENT_AMBULATORY_CARE_PROVIDER_SITE_OTHER): Admitting: Family Medicine

## 2023-10-10 ENCOUNTER — Encounter: Payer: Self-pay | Admitting: Family Medicine

## 2023-10-10 VITALS — BP 128/69 | HR 98 | Ht 65.0 in | Wt 164.0 lb

## 2023-10-10 DIAGNOSIS — J069 Acute upper respiratory infection, unspecified: Secondary | ICD-10-CM | POA: Diagnosis not present

## 2023-10-10 LAB — VERITOR FLU A/B WAIVED
Influenza A: NEGATIVE
Influenza B: NEGATIVE

## 2023-10-10 MED ORDER — OSELTAMIVIR PHOSPHATE 75 MG PO CAPS: 75.0000 mg | ORAL_CAPSULE | Freq: Two times a day (BID) | ORAL | 0 refills | Status: AC

## 2023-10-10 NOTE — Patient Instructions (Signed)
 recommended that she can use Benadryl and Claritin and Flonase to help with symptoms.  She can still also take Tylenol as well

## 2023-10-10 NOTE — Progress Notes (Signed)
 BP 128/69   Pulse 98   Ht 5\' 5"  (1.651 m)   Wt 164 lb (74.4 kg)   BMI 27.29 kg/m    Subjective:   Patient ID: Kristen Mejia, female    DOB: September 16, 2000, 23 y.o.   MRN: [id number]  HPI: Kristen Mejia is a 23 y.o. female presenting on 10/10/2023 for No chief complaint on file.   HPI Patient comes in today with complaints of cough and congestion and nasal congestion and sore throat.  She also started feeling fevers and chills and bodyaches.  It all started overnight last night and she was feeling a little short of breath with it last night when she was laying down although that comes and goes.  Her cough is mostly dry and nonproductive.  She did take some Tylenol last night and thinks it may be helped a little bit.  She says that her father had flu last week but she does not really think that she was around him when he had it.  She does work at [employer].  Relevant past medical, surgical, family and social history reviewed and updated as indicated. Interim medical history since our last visit reviewed. Allergies and medications reviewed and updated.  Review of Systems  Constitutional:  Positive for chills and fever.  HENT:  Positive for congestion, postnasal drip, rhinorrhea, sinus pressure and sore throat. Negative for ear discharge, ear pain and sneezing.   Eyes:  Negative for pain, redness and visual disturbance.  Respiratory:  Positive for cough and shortness of breath. Negative for chest tightness and wheezing.   Cardiovascular:  Negative for chest pain and leg swelling.  Genitourinary:  Negative for difficulty urinating and dysuria.  Musculoskeletal:  Positive for myalgias. Negative for back pain and gait problem.  Skin:  Negative for rash.  Neurological:  Negative for light-headedness and headaches.  Psychiatric/Behavioral:  Negative for agitation and behavioral problems.   All other systems reviewed and are negative.   Per HPI unless specifically indicated  above   Allergies as of 10/10/2023   No Known Allergies      Medication List        Accurate as of October 10, 2023  9:27 AM. If you have any questions, ask your nurse or doctor.          methimazole 5 MG tablet Commonly known as: TAPAZOLE Take 1 tablet (5 mg total) by mouth daily with breakfast.   oseltamivir 75 MG capsule Commonly known as: Tamiflu Take 1 capsule (75 mg total) by mouth 2 (two) times daily for 5 days. Started by: Elige Radon Kellyn Mccary   PRENATAL VITAMIN PO Take by mouth.   pyridOXINE 100 MG tablet Commonly known as: VITAMIN B6 Take 100 mg by mouth daily.         Objective:   BP 128/69   Pulse 98   Ht 5\' 5"  (1.651 m)   Wt 164 lb (74.4 kg)   BMI 27.29 kg/m   Wt Readings from Last 3 Encounters:  10/10/23 164 lb (74.4 kg)  09/16/23 158 lb 3.2 oz (71.8 kg)  09/12/23 156 lb 6.4 oz (70.9 kg)    Physical Exam Vitals reviewed.  Constitutional:      General: She is not in acute distress.    Appearance: She is well-developed. She is not diaphoretic.  HENT:     Right Ear: Tympanic membrane, ear canal and external ear normal.     Left Ear: Tympanic membrane, ear canal and external ear  normal.     Nose: Mucosal edema and rhinorrhea present.     Right Sinus: No maxillary sinus tenderness or frontal sinus tenderness.     Left Sinus: No maxillary sinus tenderness or frontal sinus tenderness.     Mouth/Throat:     Pharynx: Uvula midline. Posterior oropharyngeal erythema present. No oropharyngeal exudate.     Tonsils: No tonsillar abscesses.  Eyes:     Conjunctiva/sclera: Conjunctivae normal.  Cardiovascular:     Rate and Rhythm: Normal rate and regular rhythm.     Heart sounds: Normal heart sounds. No murmur heard. Pulmonary:     Effort: Pulmonary effort is normal. No respiratory distress.     Breath sounds: Rhonchi and rales present. No wheezing.  Musculoskeletal:        General: No tenderness. Normal range of motion.  Skin:    General: Skin is  warm and dry.     Findings: No rash.  Neurological:     Mental Status: She is alert and oriented to person, place, and time.     Coordination: Coordination normal.  Psychiatric:        Behavior: Behavior normal.     Flu negative but it is very early and based on her symptoms we will go ahead and treat like flu.  Assessment & Plan:   Problem List Items Addressed This Visit   None Visit Diagnoses       Upper respiratory tract infection, unspecified type    -  Primary   Relevant Medications   oseltamivir (TAMIFLU) 75 MG capsule   Other Relevant Orders   Veritor Flu A/B Waived       Sent Tamiflu for her Also recommended that she can use Benadryl and Claritin and Flonase to help with symptoms.  She can still also take Tylenol as well Follow up plan: Return if symptoms worsen or fail to improve.  Counseling provided for all of the vaccine components Orders Placed This Encounter  Procedures   Veritor Flu A/B Waived    Arville Care, MD [facility] 10/10/2023, 9:27 AM

## 2023-10-13 ENCOUNTER — Encounter: Payer: Self-pay | Admitting: Obstetrics & Gynecology

## 2023-10-13 ENCOUNTER — Ambulatory Visit (INDEPENDENT_AMBULATORY_CARE_PROVIDER_SITE_OTHER): Admitting: Obstetrics & Gynecology

## 2023-10-13 VITALS — BP 130/75 | HR 91 | Wt 161.6 lb

## 2023-10-13 DIAGNOSIS — E0590 Thyrotoxicosis, unspecified without thyrotoxic crisis or storm: Secondary | ICD-10-CM

## 2023-10-13 DIAGNOSIS — O0992 Supervision of high risk pregnancy, unspecified, second trimester: Secondary | ICD-10-CM | POA: Diagnosis not present

## 2023-10-13 DIAGNOSIS — Z3A23 23 weeks gestation of pregnancy: Secondary | ICD-10-CM | POA: Diagnosis not present

## 2023-10-13 DIAGNOSIS — O0990 Supervision of high risk pregnancy, unspecified, unspecified trimester: Secondary | ICD-10-CM

## 2023-10-13 DIAGNOSIS — O99282 Endocrine, nutritional and metabolic diseases complicating pregnancy, second trimester: Secondary | ICD-10-CM

## 2023-10-13 NOTE — Progress Notes (Signed)
 HIGH-RISK PREGNANCY VISIT Patient name: Kristen Mejia MRN [id number]  Date of birth: 05/23/2001 Chief Complaint:   Routine Prenatal Visit  History of Present Illness:   Kristen Mejia is a 23 y.o. G1P0 female at [redacted]w[redacted]d with an Estimated Date of Delivery: 02/05/24 being seen today for ongoing management of a high-risk pregnancy complicated by:  -Hyperthyroidism -currently on methimazole -lab work today, appt scheduled for tmr  Today she reports no complaints.   Contractions: Not present. Vag. Bleeding: None.  Movement: Present. denies leaking of fluid.      10/10/2023    9:11 AM 07/27/2023   10:28 AM 06/10/2023    9:22 AM 05/19/2023    2:19 PM 02/07/2023    2:28 PM  Depression screen PHQ 2/9  Decreased Interest 0 0 0 0 0  Down, Depressed, Hopeless 0 0 0 0 0  PHQ - 2 Score 0 0 0 0 0  Altered sleeping  2 0  0  Tired, decreased energy  3 0  0  Change in appetite  2 0  0  Feeling bad or failure about yourself   0 0  0  Trouble concentrating  0 0  0  Moving slowly or fidgety/restless  0 0  0  Suicidal thoughts  0 0  0  PHQ-9 Score  7 0  0  Difficult doing work/chores   Not difficult at all  Not difficult at all     Current Outpatient Medications  Medication Instructions   methimazole (TAPAZOLE) 5 mg, Oral, Daily with breakfast   oseltamivir (TAMIFLU) 75 mg, Oral, 2 times daily   Prenatal Vit-Fe Fumarate-FA (PRENATAL VITAMIN PO) Take by mouth.   pyridOXINE (VITAMIN B6) 100 mg, Daily     Review of Systems:   Pertinent items are noted in HPI Denies abnormal vaginal discharge w/ itching/odor/irritation, headaches, visual changes, shortness of breath, chest pain, abdominal pain, severe nausea/vomiting, or problems with urination or bowel movements unless otherwise stated above. Pertinent History Reviewed:  Reviewed past medical,surgical, social, obstetrical and family history.  Reviewed problem list, medications and allergies. Physical Assessment:   Vitals:   10/13/23 1430  BP:  130/75  Pulse: 91  Weight: 161 lb 9.6 oz (73.3 kg)  Body mass index is 26.89 kg/m.           Physical Examination:   General appearance: alert, well appearing, and in no distress  Mental status: normal mood, behavior, speech, dress, motor activity, and thought processes  Skin: warm & dry   Extremities:   no edema   Cardiovascular: normal heart rate noted  Respiratory: normal respiratory effort, no distress  Abdomen: gravid, soft, non-tender  Pelvic: Cervical exam deferred         Fetal Status: Fetal Heart Rate (bpm): 150 Fundal Height: 22 cm Movement: Present    Fetal Surveillance Testing today: doppler   Chaperone: N/A    No results found for this or any previous visit (from the past 24 hours).   Assessment & Plan:  High-risk pregnancy: G1P0 at [redacted]w[redacted]d with an Estimated Date of Delivery: 02/05/24   1. Hyperthyroidism affecting pregnancy in second trimester Continue methimazole Endo appt tmr, lab work pending  2. Supervision of high risk pregnancy, antepartum (Primary) PN2 next visit  3. [redacted] weeks gestation of pregnancy    Meds: No orders of the defined types were placed in this encounter.   Labs/procedures today: none  Treatment Plan:  routine OB care and as outlined above  Reviewed: Preterm labor symptoms  and general obstetric precautions including but not limited to vaginal bleeding, contractions, leaking of fluid and fetal movement were reviewed in detail with the patient.  All questions were answered. Pt has home bp cuff. Check bp weekly, let us know if >140/90.   Follow-up: Return for HROB visit (ok for CNM) and PN2.   Future Appointments  Date Time Provider Department Center  10/17/2023 11:00 AM Dani Gobble, NP [facility] None    No orders of the defined types were placed in this encounter.   Myna Hidalgo, DO Attending Obstetrician & Gynecologist, [facility] for [employer], [facility]

## 2023-10-14 ENCOUNTER — Ambulatory Visit: Admitting: Family Medicine

## 2023-10-14 ENCOUNTER — Encounter: Payer: Self-pay | Admitting: Family Medicine

## 2023-10-14 ENCOUNTER — Encounter: Payer: BC Managed Care – PPO | Admitting: Obstetrics & Gynecology

## 2023-10-14 VITALS — BP 113/80 | HR 85 | Temp 98.3°F | Ht 65.0 in | Wt 160.0 lb

## 2023-10-14 DIAGNOSIS — Z3A23 23 weeks gestation of pregnancy: Secondary | ICD-10-CM

## 2023-10-14 DIAGNOSIS — H66003 Acute suppurative otitis media without spontaneous rupture of ear drum, bilateral: Secondary | ICD-10-CM | POA: Diagnosis not present

## 2023-10-14 LAB — COMPREHENSIVE METABOLIC PANEL
ALT: 7 IU/L (ref 0–32)
AST: 16 IU/L (ref 0–40)
Albumin: 3.7 g/dL — ABNORMAL LOW (ref 4.0–5.0)
Alkaline Phosphatase: 155 IU/L — ABNORMAL HIGH (ref 44–121)
BUN/Creatinine Ratio: 9 (ref 9–23)
BUN: 5 mg/dL — ABNORMAL LOW (ref 6–20)
Bilirubin Total: 0.4 mg/dL (ref 0.0–1.2)
Calcium: 9 mg/dL (ref 8.7–10.2)
Chloride: 102 mmol/L (ref 96–106)
Creatinine, Ser: 0.53 mg/dL — ABNORMAL LOW (ref 0.57–1.00)
Globulin, Total: 2.8 g/dL (ref 1.5–4.5)
Glucose: 67 mg/dL — ABNORMAL LOW (ref 70–99)
Potassium: 4.2 mmol/L (ref 3.5–5.2)
Sodium: 135 mmol/L (ref 134–144)
Total Protein: 6.5 g/dL (ref 6.0–8.5)
eGFR: 133 mL/min/{1.73_m2} (ref >59)

## 2023-10-14 LAB — T4, FREE: Free T4: 1.17 ng/dL (ref 0.82–1.77)

## 2023-10-14 LAB — T3, FREE: T3, Free: 3.7 pg/mL (ref 2.0–4.4)

## 2023-10-14 LAB — TSH: TSH: <0.005 u[IU]/mL — ABNORMAL LOW (ref 0.450–4.500)

## 2023-10-14 MED ORDER — AMOXICILLIN 875 MG PO TABS: 875.0000 mg | ORAL_TABLET | Freq: Two times a day (BID) | ORAL | 0 refills | Status: AC
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
## 2023-10-17 ENCOUNTER — Encounter: Payer: Self-pay | Admitting: Nurse Practitioner

## 2023-10-17 ENCOUNTER — Ambulatory Visit (INDEPENDENT_AMBULATORY_CARE_PROVIDER_SITE_OTHER): Payer: BC Managed Care – PPO | Admitting: Nurse Practitioner

## 2023-10-17 VITALS — BP 110/70 | HR 101 | Ht 65.0 in | Wt 161.2 lb

## 2023-10-17 DIAGNOSIS — Z8349 Family history of other endocrine, nutritional and metabolic diseases: Secondary | ICD-10-CM

## 2023-10-17 DIAGNOSIS — E0590 Thyrotoxicosis, unspecified without thyrotoxic crisis or storm: Secondary | ICD-10-CM
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
## 2023-10-20 ENCOUNTER — Ambulatory Visit: Admitting: Family

## 2023-10-20 VITALS — BP 124/87 | HR 115 | Temp 97.6°F

## 2023-10-20 DIAGNOSIS — Z3A24 24 weeks gestation of pregnancy: Secondary | ICD-10-CM

## 2023-10-20 DIAGNOSIS — H66003 Acute suppurative otitis media without spontaneous rupture of ear drum, bilateral: Secondary | ICD-10-CM | POA: Diagnosis not present

## 2023-10-20 DIAGNOSIS — H6993 Unspecified Eustachian tube disorder, bilateral: Secondary | ICD-10-CM

## 2023-10-20 MED ORDER — FLUTICASONE PROPIONATE 50 MCG/ACT NA SUSP: 2.0000 | Freq: Every day | NASAL | 6 refills | Status: DC

## 2023-10-20 MED ORDER — CETIRIZINE HCL 10 MG PO TABS: 10.0000 mg | ORAL_TABLET | Freq: Every day | ORAL | 1 refills | Status: DC

## 2023-10-20 NOTE — Patient Instructions (Signed)
 Eustachian Tube Dysfunction  Eustachian tube dysfunction refers to a condition in which a blockage develops in the narrow passage that connects the middle ear to the back of the nose (eustachian tube). The eustachian tube regulates air pressure in the middle ear by letting air move between the ear and nose. It also helps to drain fluid from the middle ear space. Eustachian tube dysfunction can affect one or both ears. When the eustachian tube does not function properly, air pressure, fluid, or both can build up in the middle ear. What are the causes? This condition occurs when the eustachian tube becomes blocked or cannot open normally. Common causes of this condition include: Ear infections. Colds and other infections that affect the nose, mouth, and throat (upper respiratory tract). Allergies. Irritation from cigarette smoke. Irritation from stomach acid coming up into the esophagus (gastroesophageal reflux). The esophagus is the part of the body that moves food from the mouth to the stomach. Sudden changes in air pressure, such as from descending in an airplane or scuba diving. Abnormal growths in the nose or throat, such as: Growths that line the nose (nasal polyps). Abnormal growth of cells (tumors). Enlarged tissue at the back of the throat (adenoids). What increases the risk? You are more likely to develop this condition if: You smoke. You are overweight. You are a child who has: Certain birth defects of the mouth, such as cleft palate. Large tonsils or adenoids. What are the signs or symptoms? Common symptoms of this condition include: A feeling of fullness in the ear. Ear pain. Clicking or popping noises in the ear. Ringing in the ear (tinnitus). Hearing loss. Loss of balance. Dizziness. Symptoms may get worse when the air pressure around you changes, such as when you travel to an area of high elevation, fly on an airplane, or go scuba diving. How is this diagnosed? This  condition may be diagnosed based on: Your symptoms. A physical exam of your ears, nose, and throat. Tests, such as those that measure: The movement of your eardrum. Your hearing (audiometry). How is this treated? Treatment depends on the cause and severity of your condition. In mild cases, you may relieve your symptoms by moving air into your ears. This is called "popping the ears." In more severe cases, or if you have symptoms of fluid in your ears, treatment may include: Medicines to relieve congestion (decongestants). Medicines that treat allergies (antihistamines). Nasal sprays or ear drops that contain medicines that reduce swelling (steroids). A procedure to drain the fluid in your eardrum. In this procedure, a small tube may be placed in the eardrum to: Drain the fluid. Restore the air in the middle ear space. A procedure to insert a balloon device through the nose to inflate the opening of the eustachian tube (balloon dilation). Follow these instructions at home: Lifestyle Do not do any of the following until your health care provider approves: Travel to high altitudes. Fly in airplanes. Work in a Estate agent or room. Scuba dive. Do not use any products that contain nicotine or tobacco. These products include cigarettes, chewing tobacco, and vaping devices, such as e-cigarettes. If you need help quitting, ask your health care provider. Keep your ears dry. Wear fitted earplugs during showering and bathing. Dry your ears completely after. General instructions Take over-the-counter and prescription medicines only as told by your health care provider. Use techniques to help pop your ears as recommended by your health care provider. These may include: Chewing gum. Yawning. Frequent, forceful swallowing.  Closing your mouth, holding your nose closed, and gently blowing as if you are trying to blow air out of your nose. Keep all follow-up visits. This is important. Contact a  health care provider if: Your symptoms do not go away after treatment. Your symptoms come back after treatment. You are unable to pop your ears. You have: A fever. Pain in your ear. Pain in your head or neck. Fluid draining from your ear. Your hearing suddenly changes. You become very dizzy. You lose your balance. Get help right away if: You have a sudden, severe increase in any of your symptoms. Summary Eustachian tube dysfunction refers to a condition in which a blockage develops in the eustachian tube. It can be caused by ear infections, allergies, inhaled irritants, or abnormal growths in the nose or throat. Symptoms may include ear pain or fullness, hearing loss, or ringing in the ears. Mild cases are treated with techniques to unblock the ears, such as yawning or chewing gum. More severe cases are treated with medicines or procedures. This information is not intended to replace advice given to you by your health care provider. Make sure you discuss any questions you have with your health care provider. Document Revised: 10/06/2020 Document Reviewed: 10/06/2020 Elsevier Patient Education  2024 [employer].

## 2023-10-20 NOTE — Progress Notes (Signed)
 Subjective:    Patient ID: Kristen Mejia, female    DOB: 12-04-2000, 23 y.o.   MRN: [id number]  Chief Complaint  Patient presents with   Ear Fullness   PT presents to the office today with bilateral ear fullness that is worse in right. She was seen in the office on 10/14/23 and started on amoxicillin 875 mg BID. Reports the pain has improved, but continues to have hearing loss and fullness.   She is currently [redacted] weeks pregnant.  Ear Fullness  There is pain in both ears. This is a new problem. The current episode started 1 to 4 weeks ago. The problem occurs constantly. The problem has been waxing and waning. There has been no fever. The pain is at a severity of 3/10. The pain is mild. Associated symptoms include headaches and hearing loss. Pertinent negatives include no coughing, ear discharge, rhinorrhea or sore throat. She has tried antibiotics for the symptoms. The treatment provided mild relief.      Review of Systems  HENT:  Positive for hearing loss. Negative for ear discharge, rhinorrhea and sore throat.   Respiratory:  Negative for cough.   Neurological:  Positive for headaches.    Social History   Socioeconomic History   Marital status: Single    Spouse name: Not on file   Number of children: Not on file   Years of education: Not on file   Highest education level: Some college, no degree  Occupational History   Not on file  Tobacco Use   Smoking status: Never    Passive exposure: Yes   Smokeless tobacco: Never  Vaping Use   Vaping status: Former   Substances: Nicotine, Flavoring  Substance and Sexual Activity   Alcohol use: No    Comment: occas   Drug use: No   Sexual activity: Yes    Birth control/protection: None  Other Topics Concern   Not on file  Social History Narrative   Not on file   Social Drivers of Health   Financial Resource Strain: Patient Declined (10/20/2023)   Overall Financial Resource Strain (CARDIA)    Difficulty of Paying Living  Expenses: Patient declined  Food Insecurity: No Food Insecurity (10/20/2023)   Hunger Vital Sign    Worried About Running Out of Food in the Last Year: Never true    Ran Out of Food in the Last Year: Never true  Transportation Needs: No Transportation Needs (10/20/2023)   PRAPARE - Administrator, Civil Service (Medical): No    Lack of Transportation (Non-Medical): No  Physical Activity: Sufficiently Active (10/20/2023)   Exercise Vital Sign    Days of Exercise per Week: 5 days    Minutes of Exercise per Session: 60 min  Stress: No Stress Concern Present (10/20/2023)   [employer] of Occupational Health - Occupational Stress Questionnaire    Feeling of Stress : Not at all  Recent Concern: Stress - Stress Concern Present (07/27/2023)   [employer] of Occupational Health - Occupational Stress Questionnaire    Feeling of Stress : To some extent  Social Connections: Moderately Isolated (10/20/2023)   Social Connection and Isolation Panel [NHANES]    Frequency of Communication with Friends and Family: More than three times a week    Frequency of Social Gatherings with Friends and Family: Once a week    Attends Religious Services: More than 4 times per year    Active Member of [employer] or Organizations: No    Attends [employer]  or Organization Meetings: Never    Marital Status: Never married   Family History  Problem Relation Age of Onset   Hypertension Mother    Hypothyroidism Maternal Aunt    Hypothyroidism Maternal Uncle    Hypothyroidism Maternal Grandmother    Hypothyroidism Maternal Grandfather    Diabetes Paternal Grandmother         Objective:   Physical Exam    BP 124/87   Pulse (!) 115   Temp 97.6 F (36.4 C) (Temporal)   SpO2 100%      Assessment & Plan:  Kristen Mejia comes in today with chief complaint of Ear Fullness   Diagnosis and orders addressed:  1. Non-recurrent acute suppurative otitis media of both ears without spontaneous rupture of  tympanic membranes (Primary) - cetirizine (ZYRTEC ALLERGY) 10 MG tablet; Take 1 tablet (10 mg total) by mouth daily.  Dispense: 90 tablet; Refill: 1 - fluticasone (FLONASE) 50 MCG/ACT nasal spray; Place 2 sprays into both nostrils daily.  Dispense: 16 g; Refill: 6  2. Eustachian tube dysfunction, bilateral - cetirizine (ZYRTEC ALLERGY) 10 MG tablet; Take 1 tablet (10 mg total) by mouth daily.  Dispense: 90 tablet; Refill: 1 - fluticasone (FLONASE) 50 MCG/ACT nasal spray; Place 2 sprays into both nostrils daily.  Dispense: 16 g; Refill: 6  3. [redacted] weeks gestation of pregnancy    Continue Amoxicillin Start zyrtec and Flonase Force fluids Tylenol as needed IF symptoms worsen or do not improve, will need referral to ENT  [name], FNP

## 2023-10-24 ENCOUNTER — Telehealth: Payer: Self-pay | Admitting: Family

## 2023-10-24 ENCOUNTER — Telehealth: Admitting: Family Medicine

## 2023-10-24 DIAGNOSIS — H6993 Unspecified Eustachian tube disorder, bilateral: Secondary | ICD-10-CM

## 2023-10-24 NOTE — Telephone Encounter (Signed)
 Copied from CRM [id number]. Topic: Clinical - Medical Advice >> Oct 24, 2023 11:44 AM Clayton Bibles wrote: Reason for CRM: Kristen Mejia is calling about this message she sent in MyChart  Can I please have an extension on my note until this thursday the 20th or friday.. I have an appointment with my OB tomorrow due to my blood pressure being high lately, that may be another reason I haven't felt very good   Please send a note back through MyChart

## 2023-10-24 NOTE — Telephone Encounter (Signed)
 See work note to [employer].

## 2023-10-24 NOTE — Progress Notes (Signed)
 Needs to be seen by OB for BP  Is being followed by PCP and referral to ENT for ear pain  Patient acknowledged agreement and understanding of the plan.

## 2023-10-25 ENCOUNTER — Ambulatory Visit (INDEPENDENT_AMBULATORY_CARE_PROVIDER_SITE_OTHER): Admitting: *Deleted

## 2023-10-25 VITALS — BP 121/85 | HR 91

## 2023-10-25 DIAGNOSIS — Z3A35 35 weeks gestation of pregnancy: Secondary | ICD-10-CM

## 2023-10-25 DIAGNOSIS — O0993 Supervision of high risk pregnancy, unspecified, third trimester: Secondary | ICD-10-CM

## 2023-10-25 DIAGNOSIS — Z0130 Encounter for examination of blood pressure without abnormal findings: Secondary | ICD-10-CM

## 2023-10-25 DIAGNOSIS — O0990 Supervision of high risk pregnancy, unspecified, unspecified trimester: Secondary | ICD-10-CM

## 2023-10-25 NOTE — Progress Notes (Signed)
   NURSE VISIT- BLOOD PRESSURE CHECK  SUBJECTIVE:  Kristen Mejia is a 23 y.o. G1P0 female here for BP check. She is [redacted]w[redacted]d pregnant    HYPERTENSION ROS:  Pregnant:  Severe headaches that don't go away with tylenol/other medicines: No  Visual changes (seeing spots/double/blurred vision) No  Severe pain under right breast breast or in center of upper chest No  Severe nausea/vomiting No  Taking medicines as instructed not applicable   OBJECTIVE:  BP 121/85   Pulse 91   Appearance alert, well appearing, and in no distress.  ASSESSMENT: Pregnancy [redacted]w[redacted]d  blood pressure check  PLAN: Discussed with Dr. Charlotta Newton   Recommendations: no changes needed   Follow-up: as scheduled   Jobe Marker  10/25/2023 10:56 AM

## 2023-10-27 MED ORDER — AMOXICILLIN-POT CLAVULANATE 875-125 MG PO TABS
1.0000 | ORAL_TABLET | Freq: Two times a day (BID) | ORAL | 0 refills | Status: DC
Start: 2023-10-27 — End: 2023-11-14

## 2023-10-31 ENCOUNTER — Encounter: Payer: Self-pay | Admitting: Women's Health

## 2023-10-31 ENCOUNTER — Other Ambulatory Visit

## 2023-10-31 ENCOUNTER — Ambulatory Visit (INDEPENDENT_AMBULATORY_CARE_PROVIDER_SITE_OTHER): Admitting: Women's Health

## 2023-10-31 ENCOUNTER — Encounter: Payer: Self-pay | Admitting: Obstetrics & Gynecology

## 2023-10-31 VITALS — BP 126/85 | HR 112 | Wt 160.0 lb

## 2023-10-31 DIAGNOSIS — E069 Thyroiditis, unspecified: Secondary | ICD-10-CM | POA: Diagnosis not present

## 2023-10-31 DIAGNOSIS — E0590 Thyrotoxicosis, unspecified without thyrotoxic crisis or storm: Secondary | ICD-10-CM

## 2023-10-31 DIAGNOSIS — O0990 Supervision of high risk pregnancy, unspecified, unspecified trimester: Secondary | ICD-10-CM

## 2023-10-31 DIAGNOSIS — Z3A25 25 weeks gestation of pregnancy: Secondary | ICD-10-CM | POA: Diagnosis not present

## 2023-10-31 DIAGNOSIS — Z3A26 26 weeks gestation of pregnancy: Secondary | ICD-10-CM | POA: Diagnosis not present

## 2023-10-31 DIAGNOSIS — O0992 Supervision of high risk pregnancy, unspecified, second trimester: Secondary | ICD-10-CM

## 2023-10-31 DIAGNOSIS — Z1389 Encounter for screening for other disorder: Secondary | ICD-10-CM | POA: Diagnosis not present

## 2023-10-31 DIAGNOSIS — O99282 Endocrine, nutritional and metabolic diseases complicating pregnancy, second trimester: Secondary | ICD-10-CM

## 2023-10-31 DIAGNOSIS — Z131 Encounter for screening for diabetes mellitus: Secondary | ICD-10-CM | POA: Diagnosis not present

## 2023-10-31 DIAGNOSIS — Z331 Pregnant state, incidental: Secondary | ICD-10-CM

## 2023-10-31 LAB — POCT URINALYSIS DIPSTICK OB
Blood, UA: NEGATIVE
Glucose, UA: NEGATIVE
Ketones, UA: NEGATIVE
Leukocytes, UA: NEGATIVE
Nitrite, UA: NEGATIVE

## 2023-10-31 NOTE — Addendum Note (Signed)
 Addended by: Cheral Marker on: 10/31/2023 09:57 AM   Modules accepted: Orders

## 2023-10-31 NOTE — Patient Instructions (Addendum)
 Kristen Mejia, thank you for choosing our office today! We appreciate the opportunity to meet your healthcare needs. You may receive a short survey by mail, e-mail, or through [employer]. If you are happy with your care we would appreciate if you could take just a few minutes to complete the survey questions. We read all of your comments and take your feedback very seriously. Thank you again for choosing our office.  Center for [employer] Team at [facility]  [facility] ([address] [place], [place] [address]) Entrance C, located off of [address] Free 24/7 valet parking   CLASSES: Go to [email or web address] to register for classes (childbirth, breastfeeding, waterbirth, infant CPR, daddy bootcamp, etc.)  Call the office [phone]) or go to [facility] if: You begin to have strong, frequent contractions Your water breaks.  Sometimes it is a big gush of fluid, sometimes it is just a trickle that keeps getting your panties wet or running down your legs You have vaginal bleeding.  It is normal to have a small amount of spotting if your cervix was checked.  You don't feel your baby moving like normal.  If you don't, get you something to eat and drink and lay down and focus on feeling your baby move.   If your baby is still not moving like normal, you should call the office or go to [facility].  Call the office [phone]) or go to [facility] hospital for these signs of pre-eclampsia: Severe headache that does not go away with Tylenol Visual changes- seeing spots, double, blurred vision Pain under your right breast or upper abdomen that does not go away with Tums or heartburn medicine Nausea and/or vomiting Severe swelling in your hands, feet, and face   Tdap Vaccine It is recommended that you get the Tdap vaccine during the third trimester of EACH pregnancy to help protect your baby from getting pertussis (whooping cough) 27-36 weeks is the BEST time to do  this so that you can pass the protection on to your baby. During pregnancy is better than after pregnancy, but if you are unable to get it during pregnancy it will be offered at the hospital.  You can get this vaccine with [place], at the health department, your family doctor, or some local pharmacies Everyone who will be around your baby should also be up-to-date on their vaccines before the baby comes. Adults (who are not pregnant) only need 1 dose of Tdap during adulthood.   [facility] Pediatricians/Family Doctors [facility]): [address] Dr[address] Colette Ribas, [phone]           [facility]: [address], [phone]                [facility]): [address], [phone] (call to ask if accepting patients) [facility] Department: [address], [place], [phone]    [facility] Pediatricians/Family Doctors [facility]): [phone] [address], [phone] [facility]: [address] [place], [phone] [facility]: [address], [phone]  [facility] Doctors  [facility]): [phone] [facility]: [address] [phone]   [facility] Doctors [facility]: [address], [phone]  [facility] Family Doctors  [facility]: [phone], [phone]  Home Blood Pressure Monitoring for Patients   Your provider has recommended that you check your  blood pressure (BP) at least once a week at home. If you do not have a blood pressure cuff at home, one will be provided for you. Contact your provider if you have not received your monitor within 1 week.   Helpful Tips for Accurate Home Blood Pressure Checks  Don't smoke, exercise, or drink caffeine 30 minutes before checking your BP Use the restroom before checking your BP (a full bladder can raise your  pressure) Relax in a comfortable upright chair Feet on the ground Left arm resting comfortably on a flat surface at the level of your heart Legs uncrossed Back supported Sit quietly and don't talk Place the cuff on your bare arm Adjust snuggly, so that only two fingertips can fit between your skin and the top of the cuff Check 2 readings separated by at least one minute Keep a log of your BP readings For a visual, please reference this diagram: http://ccnc.care/bpdiagram  Provider Name: [facility]     Phone: [phone]  Zone 1: ALL CLEAR  Continue to monitor your symptoms:  BP reading is less than 140 (top number) or less than 90 (bottom number)  No right upper stomach pain No headaches or seeing spots No feeling nauseated or throwing up No swelling in face and hands  Zone 2: CAUTION Call your doctor's office for any of the following:  BP reading is greater than 140 (top number) or greater than 90 (bottom number)  Stomach pain under your ribs in the middle or right side Headaches or seeing spots Feeling nauseated or throwing up Swelling in face and hands  Zone 3: EMERGENCY  Seek immediate medical care if you have any of the following:  BP reading is greater than160 (top number) or greater than 110 (bottom number) Severe headaches not improving with Tylenol Serious difficulty catching your breath Any worsening symptoms from Zone 2   Third Trimester of Pregnancy The third trimester is from week 29 through week 42, months 7 through 9. The third trimester is a time when the fetus is growing rapidly. At the end of the ninth month, the fetus is about 20 inches in length and weighs 6-10 pounds.  BODY CHANGES Your body goes through many changes during pregnancy. The changes vary from woman to woman.  Your weight will continue to increase. You can expect to gain 25-35 pounds (11-16 kg) by the end of the pregnancy. You may begin to get stretch marks on your hips, abdomen,  and breasts. You may urinate more often because the fetus is moving lower into your pelvis and pressing on your bladder. You may develop or continue to have heartburn as a result of your pregnancy. You may develop constipation because certain hormones are causing the muscles that push waste through your intestines to slow down. You may develop hemorrhoids or swollen, bulging veins (varicose veins). You may have pelvic pain because of the weight gain and pregnancy hormones relaxing your joints between the bones in your pelvis. Backaches may result from overexertion of the muscles supporting your posture. You may have changes in your hair. These can include thickening of your hair, rapid growth, and changes in texture. Some women also have hair loss during or after pregnancy, or hair that feels dry or thin. Your hair will most likely return to normal after your baby is born. Your breasts will continue to grow and be tender. A yellow discharge may leak from your breasts called colostrum. Your belly button may stick out. You may  feel short of breath because of your expanding uterus. You may notice the fetus "dropping," or moving lower in your abdomen. You may have a bloody mucus discharge. This usually occurs a few days to a week before labor begins. Your cervix becomes thin and soft (effaced) near your due date. WHAT TO EXPECT AT YOUR PRENATAL EXAMS  You will have prenatal exams every 2 weeks until week 36. Then, you will have weekly prenatal exams. During a routine prenatal visit: You will be weighed to make sure you and the fetus are growing normally. Your blood pressure is taken. Your abdomen will be measured to track your baby's growth. The fetal heartbeat will be listened to. Any test results from the previous visit will be discussed. You may have a cervical check near your due date to see if you have effaced. At around 36 weeks, your caregiver will check your cervix. At the same time, your  caregiver will also perform a test on the secretions of the vaginal tissue. This test is to determine if a type of bacteria, Group B streptococcus, is present. Your caregiver will explain this further. Your caregiver may ask you: What your birth plan is. How you are feeling. If you are feeling the baby move. If you have had any abnormal symptoms, such as leaking fluid, bleeding, severe headaches, or abdominal cramping. If you have any questions. Other tests or screenings that may be performed during your third trimester include: Blood tests that check for low iron levels (anemia). Fetal testing to check the health, activity level, and growth of the fetus. Testing is done if you have certain medical conditions or if there are problems during the pregnancy. FALSE LABOR You may feel small, irregular contractions that eventually go away. These are called Braxton Hicks contractions, or false labor. Contractions may last for hours, days, or even weeks before true labor sets in. If contractions come at regular intervals, intensify, or become painful, it is best to be seen by your caregiver.  SIGNS OF LABOR  Menstrual-like cramps. Contractions that are 5 minutes apart or less. Contractions that start on the top of the uterus and spread down to the lower abdomen and back. A sense of increased pelvic pressure or back pain. A watery or bloody mucus discharge that comes from the vagina. If you have any of these signs before the 37th week of pregnancy, call your caregiver right away. You need to go to the hospital to get checked immediately. HOME CARE INSTRUCTIONS  Avoid all smoking, herbs, alcohol, and unprescribed drugs. These chemicals affect the formation and growth of the baby. Follow your caregiver's instructions regarding medicine use. There are medicines that are either safe or unsafe to take during pregnancy. Exercise only as directed by your caregiver. Experiencing uterine cramps is a good sign to  stop exercising. Continue to eat regular, healthy meals. Wear a good support bra for breast tenderness. Do not use hot tubs, steam rooms, or saunas. Wear your seat belt at all times when driving. Avoid raw meat, uncooked cheese, cat litter boxes, and soil used by cats. These carry germs that can cause birth defects in the baby. Take your prenatal vitamins. Try taking a stool softener (if your caregiver approves) if you develop constipation. Eat more high-fiber foods, such as fresh vegetables or fruit and whole grains. Drink plenty of fluids to keep your urine clear or pale yellow. Take warm sitz baths to soothe any pain or discomfort caused by hemorrhoids. Use hemorrhoid cream if  your caregiver approves. If you develop varicose veins, wear support hose. Elevate your feet for 15 minutes, 3-4 times a day. Limit salt in your diet. Avoid heavy lifting, wear low heal shoes, and practice good posture. Rest a lot with your legs elevated if you have leg cramps or low back pain. Visit your dentist if you have not gone during your pregnancy. Use a soft toothbrush to brush your teeth and be gentle when you floss. A sexual relationship may be continued unless your caregiver directs you otherwise. Do not travel far distances unless it is absolutely necessary and only with the approval of your caregiver. Take prenatal classes to understand, practice, and ask questions about the labor and delivery. Make a trial run to the hospital. Pack your hospital bag. Prepare the baby's nursery. Continue to go to all your prenatal visits as directed by your caregiver. SEEK MEDICAL CARE IF: You are unsure if you are in labor or if your water has broken. You have dizziness. You have mild pelvic cramps, pelvic pressure, or nagging pain in your abdominal area. You have persistent nausea, vomiting, or diarrhea. You have a bad smelling vaginal discharge. You have pain with urination. SEEK IMMEDIATE MEDICAL CARE IF:  You  have a fever. You are leaking fluid from your vagina. You have spotting or bleeding from your vagina. You have severe abdominal cramping or pain. You have rapid weight loss or gain. You have shortness of breath with chest pain. You notice sudden or extreme swelling of your face, hands, ankles, feet, or legs. You have not felt your baby move in over an hour. You have severe headaches that do not go away with medicine. You have vision changes. Document Released: 07/20/2001 Document Revised: 07/31/2013 Document Reviewed: 09/26/2012 [facility] Patient Information 2015 [place], [place]. This information is not intended to replace advice given to you by your health care provider. Make sure you discuss any questions you have with your health care provider.  Tips To Increase Milk Supply Lots of water! Enough so that your urine is clear Plenty of calories, if you're not getting enough calories, your milk supply can decrease Breastfeed/pump often, every 2-3 hours x 20-1mins Fenugreek 3 pills 3 times a day, this may make your urine smell like maple syrup Mother's Milk Tea Lactation cookies, google for the recipe Real oatmeal Body Armor sports drinks Liquid Gold Greater Than hydration drink   AREA PEDIATRIC/FAMILY PRACTICE PHYSICIANS  [facility] [address] [place], [place] [address] Phone - [phone]   Fax - [phone]  JACK AMOS [address] [place], [place]  [address] Phone - [phone]   Fax - [phone]  [facility] [address] [place], [place]  [address] Phone - [phone]   Fax - [phone]  [facility] [address] [place], [place]  [address] Phone - [phone]   Fax - [phone]  [facility] [address] [place], [place]  [address] Phone - [phone]   Fax - [phone]  [facility] [address] [place], [place]  [address] Phone  - [phone]   Fax - [phone]  [facility] [place] [address] [place], [place]  [address] Phone - [phone]   Fax - [phone]  [facility] AT [facility] [address] [place], [address] [place], [place]  [address] Phone - [phone]   Fax - [phone]  [facility] AT [facility] [phone]  [address] [place], [place]  [address] Phone - [phone]   Fax - [phone] [facility] [place] [address] [place], [place]  [address] Phone - [phone]   Fax - [phone]  [facility] [address] [place], [place]  [address] Phone - [phone]   Fax - [phone]  [facility] [address] [place], [place]  [address] Phone - [phone]   Fax - [phone]  [facility] [place] [address] [place], [place]  [address] Phone - [phone]   Fax - [phone]  [facility] [address] Suite [place], [place]  [address] Phone - [phone]  [facility] [address] [place], [place]  [address] Phone - [phone]   Fax - [phone]  [facility] [address] [place], [place]  [address] Phone - [phone]   Fax - [phone]  [facility] [address] [place], [place]  [address] Phone - [phone]   Fax - [phone]  [facility] [address] [place], [place]  [address] Phone - [phone]   Fax - [phone]   [facility] [address] [place], [place]  [address] Phone - [phone]   Fax - [phone]  [facility] [address] [place], [place]  [address] Phone - [phone]   Fax - [phone]  DAVID RUBIN [address] [place], [place]  [address] Phone - [phone]   Fax - [phone]  [facility] [address] [place], [place]  [address] Phone - [phone]   Fax - [phone]  [facility] Alita Chyle [address] [place], [place]  [address] Phone - [phone]   Fax - [phone] Gerarda Fraction [address] [place], [place]  [address] Phone - [phone]   Fax - [phone]  [facility] [address] [place], [place]  [address] Phone - [phone]   Fax - [phone]  [facility] - [place] [address] [place], [place]  [address] Phone - [phone]   Fax - [phone]

## 2023-10-31 NOTE — Progress Notes (Signed)
 HIGH-RISK PREGNANCY VISIT Patient name: Kristen Mejia MRN [id number]  Date of birth: 07-Sep-2000 Chief Complaint:   Routine Prenatal Visit (PN2)  History of Present Illness:   Kristen Mejia is a 23 y.o. G1P0 female at [redacted]w[redacted]d with an Estimated Date of Delivery: 02/05/24 being seen today for ongoing management of a high-risk pregnancy complicated by hyperthyroidism on meds.    Today she reports no complaints. Contractions: Not present.  .  Movement: Present. denies leaking of fluid.      10/31/2023    9:27 AM 10/14/2023    4:00 PM 10/10/2023    9:11 AM 07/27/2023   10:28 AM 06/10/2023    9:22 AM  Depression screen PHQ 2/9  Decreased Interest 0 0 0 0 0  Down, Depressed, Hopeless 0 0 0 0 0  PHQ - 2 Score 0 0 0 0 0  Altered sleeping 2   2 0  Tired, decreased energy 2   3 0  Change in appetite 3   2 0  Feeling bad or failure about yourself  0   0 0  Trouble concentrating 0   0 0  Moving slowly or fidgety/restless 0   0 0  Suicidal thoughts 0   0 0  PHQ-9 Score 7   7 0  Difficult doing work/chores     Not difficult at all        10/14/2023    4:00 PM 07/27/2023   10:29 AM 06/10/2023    9:22 AM 02/07/2023    2:28 PM  GAD 7 : Generalized Anxiety Score  Nervous, Anxious, on Edge 0 0 0 0  Control/stop worrying 0 0 0 0  Worry too much - different things 0 0 0 0  Trouble relaxing 0 0 0 0  Restless 0 0 0 0  Easily annoyed or irritable 0 0 0 0  Afraid - awful might happen 0 0 0 0  Total GAD 7 Score 0 0 0 0  Anxiety Difficulty Not difficult at all  Not difficult at all Not difficult at all     Review of Systems:   Pertinent items are noted in HPI Denies abnormal vaginal discharge w/ itching/odor/irritation, headaches, visual changes, shortness of breath, chest pain, abdominal pain, severe nausea/vomiting, or problems with urination or bowel movements unless otherwise stated above. Pertinent History Reviewed:  Reviewed past medical,surgical, social, obstetrical and family history.  Reviewed  problem list, medications and allergies. Physical Assessment:   Vitals:   10/31/23 0922  BP: 126/85  Pulse: (!) 112  Weight: 160 lb (72.6 kg)  Body mass index is 26.63 kg/m.           Physical Examination:   General appearance: alert, well appearing, and in no distress  Mental status: alert, oriented to person, place, and time  Skin: warm & dry   Extremities: Edema: None    Cardiovascular: normal heart rate noted  Respiratory: normal respiratory effort, no distress  Abdomen: gravid, soft, non-tender  Pelvic: Cervical exam deferred         Fetal Status:     Movement: Present    Fetal Surveillance Testing today: doppler   Chaperone: N/A  Results for orders placed or performed in visit on 10/31/23 (from the past 24 hours)  POC Urinalysis Dipstick OB   Collection Time: 10/31/23  9:42 AM  Result Value Ref Range   Color, UA     Clarity, UA     Glucose, UA Negative Negative   Bilirubin, UA  Ketones, UA negative    Spec Grav, UA     Blood, UA negative    pH, UA     POC,PROTEIN,UA Trace Negative, Trace, Small (1+), Moderate (2+), Large (3+), 4+   Urobilinogen, UA     Nitrite, UA negative    Leukocytes, UA Negative Negative   Appearance     Odor      Assessment & Plan:  High-risk pregnancy: G1P0 at [redacted]w[redacted]d with an Estimated Date of Delivery: 02/05/24   1) Hyperthyroid, managed by endo (next visit 5/15), currently on methimazole 5mg  daily, labs q4wks (due next visit)  Meds: No orders of the defined types were placed in this encounter.  Labs/procedures today: PN2 and declined Tdap  Treatment Plan:  U/S 24, 28, 32, 36wks    2x/wk nst or weekly bpp @ 32wks    Deliver @ 39wks (well controlled) or as per MFM   Reviewed: Preterm labor symptoms and general obstetric precautions including but not limited to vaginal bleeding, contractions, leaking of fluid and fetal movement were reviewed in detail with the patient.  All questions were answered. Does have home bp cuff. Office bp  cuff given: not applicable. Check bp weekly, let us know if consistently >140 and/or >90.  Follow-up: Return in about 2 weeks (around 11/14/2023) for HROB, US:EFW, MD, in person.   Future Appointments  Date Time Provider Department Center  11/14/2023  8:30 AM [facility] [place] [facility] None  12/22/2023 10:30 AM Dani Gobble, NP [facility] None    Orders Placed This Encounter  Procedures   POC Urinalysis Dipstick OB   Cheral Marker CNM, [facility] 10/31/2023 9:56 AM

## 2023-11-01 ENCOUNTER — Encounter: Payer: Self-pay | Admitting: Women's Health

## 2023-11-01 ENCOUNTER — Other Ambulatory Visit: Payer: Self-pay | Admitting: Women's Health

## 2023-11-01 LAB — HIV ANTIBODY (ROUTINE TESTING W REFLEX): HIV Screen 4th Generation wRfx: NONREACTIVE

## 2023-11-01 LAB — CBC
Hematocrit: 36.2 % (ref 34.0–46.6)
Hemoglobin: 12.2 g/dL (ref 11.1–15.9)
MCH: 30.7 pg (ref 26.6–33.0)
MCHC: 33.7 g/dL (ref 31.5–35.7)
MCV: 91 fL (ref 79–97)
Platelets: 345 10*3/uL (ref 150–450)
RBC: 3.98 x10E6/uL (ref 3.77–5.28)
RDW: 12.9 % (ref 11.7–15.4)
WBC: 8.7 10*3/uL (ref 3.4–10.8)

## 2023-11-01 LAB — GLUCOSE TOLERANCE, 2 HOURS W/ 1HR
Glucose, 1 hour: 114 mg/dL (ref 70–179)
Glucose, 2 hour: 86 mg/dL (ref 70–152)
Glucose, Fasting: 80 mg/dL (ref 70–91)

## 2023-11-01 LAB — ANTIBODY SCREEN: Antibody Screen: NEGATIVE

## 2023-11-01 LAB — RPR: RPR Ser Ql: NONREACTIVE

## 2023-11-11 ENCOUNTER — Other Ambulatory Visit

## 2023-11-11 ENCOUNTER — Encounter: Admitting: Obstetrics and Gynecology

## 2023-11-14 ENCOUNTER — Ambulatory Visit: Admitting: Advanced Practice Midwife

## 2023-11-14 ENCOUNTER — Ambulatory Visit

## 2023-11-14 ENCOUNTER — Encounter: Payer: Self-pay | Admitting: Advanced Practice Midwife

## 2023-11-14 VITALS — BP 116/74 | HR 77 | Wt 162.0 lb

## 2023-11-14 DIAGNOSIS — E0590 Thyrotoxicosis, unspecified without thyrotoxic crisis or storm: Secondary | ICD-10-CM | POA: Diagnosis not present

## 2023-11-14 DIAGNOSIS — O99283 Endocrine, nutritional and metabolic diseases complicating pregnancy, third trimester: Secondary | ICD-10-CM

## 2023-11-14 DIAGNOSIS — O0993 Supervision of high risk pregnancy, unspecified, third trimester: Secondary | ICD-10-CM

## 2023-11-14 DIAGNOSIS — Z3A28 28 weeks gestation of pregnancy: Secondary | ICD-10-CM

## 2023-11-14 DIAGNOSIS — O0990 Supervision of high risk pregnancy, unspecified, unspecified trimester: Secondary | ICD-10-CM

## 2023-11-14 DIAGNOSIS — O0992 Supervision of high risk pregnancy, unspecified, second trimester: Secondary | ICD-10-CM

## 2023-11-14 MED ORDER — PANTOPRAZOLE SODIUM 20 MG PO TBEC: 20.0000 mg | DELAYED_RELEASE_TABLET | Freq: Every day | ORAL | 3 refills | Status: DC

## 2023-11-14 NOTE — Progress Notes (Signed)
 HIGH-RISK PREGNANCY VISIT Patient name: Kristen Mejia MRN [id number]  Date of birth: 2000/08/23 Chief Complaint:   Routine Prenatal Visit (Heartburn getting worse)  History of Present Illness:   Kristen Mejia is a 23 y.o. G1P0 female at [redacted]w[redacted]d with an Estimated Date of Delivery: 02/05/24 being seen today for ongoing management of a high-risk pregnancy complicated by hyperthyroidism on meds.    Today she reports heartburn/reflux. Contractions: Not present. Vag. Bleeding: None.  Movement: Present. denies leaking of fluid.      10/31/2023    9:27 AM 10/14/2023    4:00 PM 10/10/2023    9:11 AM 07/27/2023   10:28 AM 06/10/2023    9:22 AM  Depression screen PHQ 2/9  Decreased Interest 0 0 0 0 0  Down, Depressed, Hopeless 0 0 0 0 0  PHQ - 2 Score 0 0 0 0 0  Altered sleeping 2   2 0  Tired, decreased energy 2   3 0  Change in appetite 3   2 0  Feeling bad or failure about yourself  0   0 0  Trouble concentrating 0   0 0  Moving slowly or fidgety/restless 0   0 0  Suicidal thoughts 0   0 0  PHQ-9 Score 7   7 0  Difficult doing work/chores     Not difficult at all        10/14/2023    4:00 PM 07/27/2023   10:29 AM 06/10/2023    9:22 AM 02/07/2023    2:28 PM  GAD 7 : Generalized Anxiety Score  Nervous, Anxious, on Edge 0 0 0 0  Control/stop worrying 0 0 0 0  Worry too much - different things 0 0 0 0  Trouble relaxing 0 0 0 0  Restless 0 0 0 0  Easily annoyed or irritable 0 0 0 0  Afraid - awful might happen 0 0 0 0  Total GAD 7 Score 0 0 0 0  Anxiety Difficulty Not difficult at all  Not difficult at all Not difficult at all     Review of Systems:   Pertinent items are noted in HPI Denies abnormal vaginal discharge w/ itching/odor/irritation, headaches, visual changes, shortness of breath, chest pain, abdominal pain, severe nausea/vomiting, or problems with urination or bowel movements unless otherwise stated above. Pertinent History Reviewed:  Reviewed past medical,surgical, social,  obstetrical and family history.  Reviewed problem list, medications and allergies. Physical Assessment:   Vitals:   11/14/23 0932  BP: 116/74  Pulse: 77  Weight: 162 lb (73.5 kg)  Body mass index is 26.96 kg/m.           Physical Examination:   General appearance: alert, well appearing, and in no distress  Mental status: alert, oriented to person, place, and time  Skin: warm & dry   Extremities: Edema: None    Cardiovascular: normal heart rate noted  Respiratory: normal respiratory effort, no distress  Abdomen: gravid, soft, non-tender  Pelvic: Cervical exam deferred         Fetal Status: Fetal Heart Rate (bpm): 133 u/s   Movement: Present    Fetal Surveillance Testing today: [place] 28+1 wks,cephalic,anterior placenta gr 0,normal ovaries,cx 3.5 cm,FHR 133 bpm,EFW 1139 g 28%     No results found for this or any previous visit (from the past 24 hours).  Assessment & Plan:  High-risk pregnancy: G1P0 at [redacted]w[redacted]d with an Estimated Date of Delivery: 02/05/24   1) Hyperthyroid, managed by endo (next  visit 12/22/23); currently on methimazole 5mg > labs today; EFW 28% today   Meds:  Meds ordered this encounter  Medications   pantoprazole (PROTONIX) 20 MG tablet    Sig: Take 1 tablet (20 mg total) by mouth daily.    Dispense:  30 tablet    Refill:  3    Supervising Provider:   Lazaro Arms [[id number]]    Labs/procedures today:  thyroid labs  Treatment Plan:  EFW q 4wks; start 2x/wk testing @ 32wks; IOL 39wks  Reviewed: Preterm labor symptoms and general obstetric precautions including but not limited to vaginal bleeding, contractions, leaking of fluid and fetal movement were reviewed in detail with the patient.  All questions were answered. Does have home bp cuff. Office bp cuff given: no. Check bp weekly, let us know if consistently >140 and/or >90.  Follow-up: Return for 2wk HROB; 4wk HROB and start 2x/wk ante testing x 7 weeks (EFW that week also and in 8wks).   Future Appointments   Date Time Provider Department Center  12/22/2023 10:30 AM Dani Gobble, NP [facility] None    No orders of the defined types were placed in this encounter.  Kristen Mejia CNM 11/14/2023 9:59 AM

## 2023-11-14 NOTE — Progress Notes (Signed)
 [place] 28+1 wks,cephalic,anterior placenta gr 0,normal ovaries,cx 3.5 cm,FHR  133 bpm,EFW 1139 g 28%

## 2023-11-28 ENCOUNTER — Encounter: Payer: Self-pay | Admitting: Women's Health

## 2023-11-28 ENCOUNTER — Ambulatory Visit: Admitting: Women's Health

## 2023-11-28 VITALS — BP 111/76 | HR 86 | Wt 163.3 lb

## 2023-11-28 DIAGNOSIS — O0993 Supervision of high risk pregnancy, unspecified, third trimester: Secondary | ICD-10-CM

## 2023-11-28 DIAGNOSIS — E0590 Thyrotoxicosis, unspecified without thyrotoxic crisis or storm: Secondary | ICD-10-CM | POA: Diagnosis not present

## 2023-11-28 DIAGNOSIS — Z3A30 30 weeks gestation of pregnancy: Secondary | ICD-10-CM | POA: Diagnosis not present

## 2023-11-28 DIAGNOSIS — O99283 Endocrine, nutritional and metabolic diseases complicating pregnancy, third trimester: Secondary | ICD-10-CM

## 2023-11-28 DIAGNOSIS — O0990 Supervision of high risk pregnancy, unspecified, unspecified trimester: Secondary | ICD-10-CM

## 2023-11-28 NOTE — Progress Notes (Signed)
 11

## 2023-11-28 NOTE — Progress Notes (Signed)
 HIGH-RISK PREGNANCY VISIT Patient name: Kristen Mejia MRN [id number]  Date of birth: [birth year] Chief Complaint:   Routine Prenatal Visit  History of Present Illness:   Kristen Mejia is a 23 y.o. G1P0 female at [redacted]w[redacted]d with an Estimated Date of Delivery: 02/05/24 being seen today for ongoing management of a high-risk pregnancy complicated by hyperthyroidism on meds.    Today she reports no complaints. Contractions: Not present.  .  Movement: Present. denies leaking of fluid.      10/31/2023    9:27 AM 10/14/2023    4:00 PM 10/10/2023    9:11 AM 07/27/2023   10:28 AM 06/10/2023    9:22 AM  Depression screen PHQ 2/9  Decreased Interest 0 0 0 0 0  Down, Depressed, Hopeless 0 0 0 0 0  PHQ - 2 Score 0 0 0 0 0  Altered sleeping 2   2 0  Tired, decreased energy 2   3 0  Change in appetite 3   2 0  Feeling bad or failure about yourself  0   0 0  Trouble concentrating 0   0 0  Moving slowly or fidgety/restless 0   0 0  Suicidal thoughts 0   0 0  PHQ-9 Score 7   7 0  Difficult doing work/chores     Not difficult at all        10/14/2023    4:00 PM 07/27/2023   10:29 AM 06/10/2023    9:22 AM 02/07/2023    2:28 PM  GAD 7 : Generalized Anxiety Score  Nervous, Anxious, on Edge 0 0 0 0  Control/stop worrying 0 0 0 0  Worry too much - different things 0 0 0 0  Trouble relaxing 0 0 0 0  Restless 0 0 0 0  Easily annoyed or irritable 0 0 0 0  Afraid - awful might happen 0 0 0 0  Total GAD 7 Score 0 0 0 0  Anxiety Difficulty Not difficult at all  Not difficult at all Not difficult at all     Review of Systems:   Pertinent items are noted in HPI Denies abnormal vaginal discharge w/ itching/odor/irritation, headaches, visual changes, shortness of breath, chest pain, abdominal pain, severe nausea/vomiting, or problems with urination or bowel movements unless otherwise stated above. Pertinent History Reviewed:  Reviewed past medical,surgical, social, obstetrical and family history.  Reviewed  problem list, medications and allergies. Physical Assessment:   Vitals:   11/28/23 1145  BP: 111/76  Pulse: 86  Weight: 163 lb 4.8 oz (74.1 kg)  Body mass index is 27.17 kg/m.           Physical Examination:   General appearance: alert, well appearing, and in no distress  Mental status: alert, oriented to person, place, and time  Skin: warm & dry   Extremities: Edema: None    Cardiovascular: normal heart rate noted  Respiratory: normal respiratory effort, no distress  Abdomen: gravid, soft, non-tender  Pelvic: Cervical exam deferred         Fetal Status: Fetal Heart Rate (bpm): 152 Fundal Height: 28 cm Movement: Present    Fetal Surveillance Testing today: doppler   Chaperone: N/A  No results found for this or any previous visit (from the past 24 hours).  Assessment & Plan:  High-risk pregnancy: G1P0 at [redacted]w[redacted]d with an Estimated Date of Delivery: 02/05/24   1) Hyperthyroidism, stable on methimazole , managed by endo, last labs 10/13/23, did not get last visit (said lab  person told her couldn't b/c order wasn't from us ), will try again today. Has appt w/ Whitney 5/15  Meds: No orders of the defined types were placed in this encounter.  Labs/procedures today:  thyroid  labs  Treatment Plan:    U/S q4wks    2x/wk nst or weekly bpp @ 32wks    Deliver @ 39wks (well controlled) or as per MFM   Reviewed: Preterm labor symptoms and general obstetric precautions including but not limited to vaginal bleeding, contractions, leaking of fluid and fetal movement were reviewed in detail with the patient.  All questions were answered. Does have home bp cuff. Office bp cuff given: not applicable. Check bp weekly, let us  know if consistently >140 and/or >90.  Follow-up: Return for As scheduled (can cancel NSTs-only needs weekly BPPs- or 2x/wk NSTs, but not both).   Future Appointments  Date Time Provider Department Center  12/12/2023  1:30 PM [facility] - [facility] IMG 2 [facility] None  12/12/2023  2:50 PM  Ferd Householder, CNM [facility]  12/19/2023 11:30 AM [facility] US  [facility] None  12/19/2023  1:30 PM Keene Pastures, DO [facility]  12/22/2023 10:30 AM Wendel Hals, NP [facility] None  12/26/2023  9:15 AM [facility] - [facility] US  [facility] None  12/26/2023 10:10 AM Wendelyn Halter, MD [facility]  01/03/2024  1:30 PM [facility] - FT IMG 2 [facility] None  01/03/2024  2:30 PM Wendelyn Halter, MD [facility]  01/09/2024 10:45 AM [facility] - FTO[facility] US  [facility] None  01/09/2024 11:30 AM Keene Pastures, DO [facility]  01/16/2024  1:30 PM [facility] - FT IMG 2 [facility] None  01/16/2024  2:30 PM Wendelyn Halter, MD [facility]  01/23/2024  1:30 PM [facility] - FT IMG 2 [facility] None  01/23/2024  2:30 PM Keene Pastures, DO [facility]  01/30/2024 10:45 AM [facility] - FTO[facility]YN US  [facility] None  01/30/2024 11:30 AM Cresenzo-Dishmon, Blanca Bunch, CNM [facility]TO[facility]    Orders Placed This Encounter  Procedures   US  FETAL BPP WO NON STRESS   Ferd Householder CNM, [facility] 11/28/2023 12:11 PM

## 2023-11-28 NOTE — Patient Instructions (Signed)
Kristen Mejia, thank you for choosing our office today! We appreciate the opportunity to meet your healthcare needs. You may receive a short survey by mail, e-mail, or through [employer]. If you are happy with your care we would appreciate if you could take just a few minutes to complete the survey questions. We read all of your comments and take your feedback very seriously. Thank you again for choosing our office.  Center for [employer] Team at [facility]  [facility] at [facility] ([address] [place], [place] [address]) Entrance C, located off of [address] Free 24/7 valet parking   CLASSES: Go to [email or web address] to register for classes (childbirth, breastfeeding, waterbirth, infant CPR, daddy bootcamp, etc.)  Call the office [phone]) or go to [facility] if: You begin to have strong, frequent contractions Your water breaks.  Sometimes it is a big gush of fluid, sometimes it is just a trickle that keeps getting your panties wet or running down your legs You have vaginal bleeding.  It is normal to have a small amount of spotting if your cervix was checked.  You don't feel your baby moving like normal.  If you don't, get you something to eat and drink and lay down and focus on feeling your baby move.   If your baby is still not moving like normal, you should call the office or go to [facility].  Call the office [phone]) or go to [facility] hospital for these signs of pre-eclampsia: Severe headache that does not go away with Tylenol Visual changes- seeing spots, double, blurred vision Pain under your right breast or upper abdomen that does not go away with Tums or heartburn medicine Nausea and/or vomiting Severe swelling in your hands, feet, and face   Tdap Vaccine It is recommended that you get the Tdap vaccine during the third trimester of EACH pregnancy to help protect your baby from getting pertussis (whooping cough) 27-36 weeks is the BEST time to do  this so that you can pass the protection on to your baby. During pregnancy is better than after pregnancy, but if you are unable to get it during pregnancy it will be offered at the hospital.  You can get this vaccine with [place], at the health department, your family doctor, or some local pharmacies Everyone who will be around your baby should also be up-to-date on their vaccines before the baby comes. Adults (who are not pregnant) only need 1 dose of Tdap during adulthood.   [facility] Pediatricians/Family Doctors [facility]): [address] Colette Ribas, [phone]           [facility]: [address], [phone]                [facility]): [address], [phone] (call to ask if accepting patients) [facility] Department: [address], [place], [phone]    [facility] Pediatricians/Family Doctors [facility]): [phone], [phone] [facility]: [address] [place], [phone] [facility]: [address], [phone]  [facility] Doctors  [facility]): [phone] [facility]: [address], [phone]   [facility] Doctors [facility]: [address], [phone]  [facility] Family Doctors  [facility]: [phone], [phone]  Home Blood Pressure Monitoring for Patients   Your provider has recommended that you check your  blood pressure (BP) at least once a week at home. If you do not have a blood pressure cuff at home, one will be provided for you. Contact your provider if you have not received your monitor within 1 week.   Helpful Tips for Accurate Home Blood Pressure Checks  Don't smoke, exercise, or drink caffeine 30 minutes before checking your BP Use the restroom before checking your BP (a full bladder can raise your  pressure) Relax in a comfortable upright chair Feet on the ground Left arm resting comfortably on a flat surface at the level of your heart Legs uncrossed Back supported Sit quietly and don't talk Place the cuff on your bare arm Adjust snuggly, so that only two fingertips can fit between your skin and the top of the cuff Check 2 readings separated by at least one minute Keep a log of your BP readings For a visual, please reference this diagram: http://ccnc.care/bpdiagram  Provider Name: [facility]     Phone: [phone]  Zone 1: ALL CLEAR  Continue to monitor your symptoms:  BP reading is less than 140 (top number) or less than 90 (bottom number)  No right upper stomach pain No headaches or seeing spots No feeling nauseated or throwing up No swelling in face and hands  Zone 2: CAUTION Call your doctor's office for any of the following:  BP reading is greater than 140 (top number) or greater than 90 (bottom number)  Stomach pain under your ribs in the middle or right side Headaches or seeing spots Feeling nauseated or throwing up Swelling in face and hands  Zone 3: EMERGENCY  Seek immediate medical care if you have any of the following:  BP reading is greater than160 (top number) or greater than 110 (bottom number) Severe headaches not improving with Tylenol Serious difficulty catching your breath Any worsening symptoms from Zone 2   Third Trimester of Pregnancy The third trimester is from week 29 through week 42, months 7 through 9. The third trimester is a time when the fetus is growing rapidly. At the end of the ninth month, the fetus is about 20 inches in length and weighs 6-10 pounds.  BODY CHANGES Your body goes through many changes during pregnancy. The changes vary from woman to woman.  Your weight will continue to increase. You can expect to gain 25-35 pounds (11-16 kg) by the end of the pregnancy. You may begin to get stretch marks on your hips, abdomen,  and breasts. You may urinate more often because the fetus is moving lower into your pelvis and pressing on your bladder. You may develop or continue to have heartburn as a result of your pregnancy. You may develop constipation because certain hormones are causing the muscles that push waste through your intestines to slow down. You may develop hemorrhoids or swollen, bulging veins (varicose veins). You may have pelvic pain because of the weight gain and pregnancy hormones relaxing your joints between the bones in your pelvis. Backaches may result from overexertion of the muscles supporting your posture. You may have changes in your hair. These can include thickening of your hair, rapid growth, and changes in texture. Some women also have hair loss during or after pregnancy, or hair that feels dry or thin. Your hair will most likely return to normal after your baby is born. Your breasts will continue to grow and be tender. A yellow discharge may leak from your breasts called colostrum. Your belly button may stick out. You may   feel short of breath because of your expanding uterus. You may notice the fetus "dropping," or moving lower in your abdomen. You may have a bloody mucus discharge. This usually occurs a few days to a week before labor begins. Your cervix becomes thin and soft (effaced) near your due date. WHAT TO EXPECT AT YOUR PRENATAL EXAMS  You will have prenatal exams every 2 weeks until week 36. Then, you will have weekly prenatal exams. During a routine prenatal visit: You will be weighed to make sure you and the fetus are growing normally. Your blood pressure is taken. Your abdomen will be measured to track your baby's growth. The fetal heartbeat will be listened to. Any test results from the previous visit will be discussed. You may have a cervical check near your due date to see if you have effaced. At around 36 weeks, your caregiver will check your cervix. At the same time, your  caregiver will also perform a test on the secretions of the vaginal tissue. This test is to determine if a type of bacteria, Group B streptococcus, is present. Your caregiver will explain this further. Your caregiver may ask you: What your birth plan is. How you are feeling. If you are feeling the baby move. If you have had any abnormal symptoms, such as leaking fluid, bleeding, severe headaches, or abdominal cramping. If you have any questions. Other tests or screenings that may be performed during your third trimester include: Blood tests that check for low iron levels (anemia). Fetal testing to check the health, activity level, and growth of the fetus. Testing is done if you have certain medical conditions or if there are problems during the pregnancy. FALSE LABOR You may feel small, irregular contractions that eventually go away. These are called Braxton Hicks contractions, or false labor. Contractions may last for hours, days, or even weeks before true labor sets in. If contractions come at regular intervals, intensify, or become painful, it is best to be seen by your caregiver.  SIGNS OF LABOR  Menstrual-like cramps. Contractions that are 5 minutes apart or less. Contractions that start on the top of the uterus and spread down to the lower abdomen and back. A sense of increased pelvic pressure or back pain. A watery or bloody mucus discharge that comes from the vagina. If you have any of these signs before the 37th week of pregnancy, call your caregiver right away. You need to go to the hospital to get checked immediately. HOME CARE INSTRUCTIONS  Avoid all smoking, herbs, alcohol, and unprescribed drugs. These chemicals affect the formation and growth of the baby. Follow your caregiver's instructions regarding medicine use. There are medicines that are either safe or unsafe to take during pregnancy. Exercise only as directed by your caregiver. Experiencing uterine cramps is a good sign to  stop exercising. Continue to eat regular, healthy meals. Wear a good support bra for breast tenderness. Do not use hot tubs, steam rooms, or saunas. Wear your seat belt at all times when driving. Avoid raw meat, uncooked cheese, cat litter boxes, and soil used by cats. These carry germs that can cause birth defects in the baby. Take your prenatal vitamins. Try taking a stool softener (if your caregiver approves) if you develop constipation. Eat more high-fiber foods, such as fresh vegetables or fruit and whole grains. Drink plenty of fluids to keep your urine clear or pale yellow. Take warm sitz baths to soothe any pain or discomfort caused by hemorrhoids. Use hemorrhoid cream if   your caregiver approves. If you develop varicose veins, wear support hose. Elevate your feet for 15 minutes, 3-4 times a day. Limit salt in your diet. Avoid heavy lifting, wear low heal shoes, and practice good posture. Rest a lot with your legs elevated if you have leg cramps or low back pain. Visit your dentist if you have not gone during your pregnancy. Use a soft toothbrush to brush your teeth and be gentle when you floss. A sexual relationship may be continued unless your caregiver directs you otherwise. Do not travel far distances unless it is absolutely necessary and only with the approval of your caregiver. Take prenatal classes to understand, practice, and ask questions about the labor and delivery. Make a trial run to the hospital. Pack your hospital bag. Prepare the baby's nursery. Continue to go to all your prenatal visits as directed by your caregiver. SEEK MEDICAL CARE IF: You are unsure if you are in labor or if your water has broken. You have dizziness. You have mild pelvic cramps, pelvic pressure, or nagging pain in your abdominal area. You have persistent nausea, vomiting, or diarrhea. You have a bad smelling vaginal discharge. You have pain with urination. SEEK IMMEDIATE MEDICAL CARE IF:  You  have a fever. You are leaking fluid from your vagina. You have spotting or bleeding from your vagina. You have severe abdominal cramping or pain. You have rapid weight loss or gain. You have shortness of breath with chest pain. You notice sudden or extreme swelling of your face, hands, ankles, feet, or legs. You have not felt your baby move in over an hour. You have severe headaches that do not go away with medicine. You have vision changes. Document Released: 07/20/2001 Document Revised: 07/31/2013 Document Reviewed: 09/26/2012 [facility] Patient Information 2015 [place], [place]. This information is not intended to replace advice given to you by your health care provider. Make sure you discuss any questions you have with your health care provider.

## 2023-11-29 ENCOUNTER — Encounter: Payer: Self-pay | Admitting: Women's Health

## 2023-11-29 ENCOUNTER — Other Ambulatory Visit: Payer: Self-pay | Admitting: Women's Health

## 2023-11-29 MED ORDER — PROMETHAZINE HCL 25 MG PO TABS: 12.5000 mg | ORAL_TABLET | Freq: Four times a day (QID) | ORAL | 6 refills | Status: DC | PRN

## 2023-12-10 DIAGNOSIS — R102 Pelvic and perineal pain: Secondary | ICD-10-CM | POA: Diagnosis not present

## 2023-12-10 DIAGNOSIS — O26893 Other specified pregnancy related conditions, third trimester: Secondary | ICD-10-CM | POA: Diagnosis not present

## 2023-12-10 DIAGNOSIS — Z3A31 31 weeks gestation of pregnancy: Secondary | ICD-10-CM | POA: Diagnosis not present

## 2023-12-10 DIAGNOSIS — O368130 Decreased fetal movements, third trimester, not applicable or unspecified: Secondary | ICD-10-CM | POA: Diagnosis not present

## 2023-12-12 ENCOUNTER — Ambulatory Visit (INDEPENDENT_AMBULATORY_CARE_PROVIDER_SITE_OTHER): Admitting: Advanced Practice Midwife

## 2023-12-12 ENCOUNTER — Ambulatory Visit: Admitting: Radiology

## 2023-12-12 ENCOUNTER — Other Ambulatory Visit: Payer: Self-pay | Admitting: Women's Health

## 2023-12-12 ENCOUNTER — Encounter: Payer: Self-pay | Admitting: Advanced Practice Midwife

## 2023-12-12 VITALS — BP 115/81 | HR 64 | Wt 163.0 lb

## 2023-12-12 DIAGNOSIS — Z3A32 32 weeks gestation of pregnancy: Secondary | ICD-10-CM

## 2023-12-12 DIAGNOSIS — O0993 Supervision of high risk pregnancy, unspecified, third trimester: Secondary | ICD-10-CM

## 2023-12-12 DIAGNOSIS — E0590 Thyrotoxicosis, unspecified without thyrotoxic crisis or storm: Secondary | ICD-10-CM

## 2023-12-12 DIAGNOSIS — O365990 Maternal care for other known or suspected poor fetal growth, unspecified trimester, not applicable or unspecified: Secondary | ICD-10-CM

## 2023-12-12 DIAGNOSIS — O0992 Supervision of high risk pregnancy, unspecified, second trimester: Secondary | ICD-10-CM

## 2023-12-12 DIAGNOSIS — O99283 Endocrine, nutritional and metabolic diseases complicating pregnancy, third trimester: Secondary | ICD-10-CM | POA: Diagnosis not present

## 2023-12-12 DIAGNOSIS — O365930 Maternal care for other known or suspected poor fetal growth, third trimester, not applicable or unspecified: Secondary | ICD-10-CM

## 2023-12-12 DIAGNOSIS — Z87898 Personal history of other specified conditions: Secondary | ICD-10-CM | POA: Insufficient documentation

## 2023-12-12 DIAGNOSIS — O0990 Supervision of high risk pregnancy, unspecified, unspecified trimester: Secondary | ICD-10-CM

## 2023-12-12 NOTE — Progress Notes (Signed)
 HIGH-RISK PREGNANCY VISIT Patient name: Kristen Mejia MRN [id number]  Date of birth: 06-Apr-2001 Chief Complaint:   Routine Prenatal Visit  History of Present Illness:   Kristen Mejia is a 23 y.o. G1P0 female at [redacted]w[redacted]d with an Estimated Date of Delivery: 02/05/24 being seen today for ongoing management of a high-risk pregnancy complicated by hyperthyroidism on meds, now w new dx FGR (EFW 9.3%, AC 9%).    Today she reports  feeling full quicker, but otherwise okay . Contractions: Not present.  .  Movement: Present. denies leaking of fluid.      10/31/2023    9:27 AM 10/14/2023    4:00 PM 10/10/2023    9:11 AM 07/27/2023   10:28 AM 06/10/2023    9:22 AM  Depression screen PHQ 2/9  Decreased Interest 0 0 0 0 0  Down, Depressed, Hopeless 0 0 0 0 0  PHQ - 2 Score 0 0 0 0 0  Altered sleeping 2   2 0  Tired, decreased energy 2   3 0  Change in appetite 3   2 0  Feeling bad or failure about yourself  0   0 0  Trouble concentrating 0   0 0  Moving slowly or fidgety/restless 0   0 0  Suicidal thoughts 0   0 0  PHQ-9 Score 7   7 0  Difficult doing work/chores     Not difficult at all        10/14/2023    4:00 PM 07/27/2023   10:29 AM 06/10/2023    9:22 AM 02/07/2023    2:28 PM  GAD 7 : Generalized Anxiety Score  Nervous, Anxious, on Edge 0 0 0 0  Control/stop worrying 0 0 0 0  Worry too much - different things 0 0 0 0  Trouble relaxing 0 0 0 0  Restless 0 0 0 0  Easily annoyed or irritable 0 0 0 0  Afraid - awful might happen 0 0 0 0  Total GAD 7 Score 0 0 0 0  Anxiety Difficulty Not difficult at all  Not difficult at all Not difficult at all     Review of Systems:   Pertinent items are noted in HPI Denies abnormal vaginal discharge w/ itching/odor/irritation, headaches, visual changes, shortness of breath, chest pain, abdominal pain, severe nausea/vomiting, or problems with urination or bowel movements unless otherwise stated above. Pertinent History Reviewed:  Reviewed past  medical,surgical, social, obstetrical and family history.  Reviewed problem list, medications and allergies. Physical Assessment:   Vitals:   12/12/23 1429 12/12/23 1552  BP: (!) 118/91 115/81  Pulse: (!) 58 64  Weight: 163 lb (73.9 kg)   Body mass index is 27.12 kg/m.           Physical Examination:   General appearance: alert, well appearing, and in no distress  Mental status: alert, oriented to person, place, and time  Skin: warm & dry   Extremities: Edema: None    Cardiovascular: normal heart rate noted  Respiratory: normal respiratory effort, no distress  Abdomen: gravid, soft, non-tender  Pelvic: Cervical exam deferred         Fetal Status:     Movement: Present    Fetal Surveillance Testing today: US : GA = 32+1 weeks Single very active female fetus, cephalic, FHR = 141 bpm, AFI = 11 cm, 17%, MVP = 4.4 cm, EFW 9%, AC 9%,  BPP = 6/8, few respirations seen but not sustained x 30 seconds  RI: 0.70, 0.68  79%  with good EDF   excellent fetal movements seen   NST: baseline 130s, one 15x15, many 10x10, no decels or variables, no uterine activity, approp (reassuring) for GA  No results found for this or any previous visit (from the past 24 hours).  Assessment & Plan:  High-risk pregnancy: G1P0 at [redacted]w[redacted]d with an Estimated Date of Delivery: 02/05/24   1) Hyperthyroidism, stable on methimazole  managed by endo; will get labs today and has endo appt 5/15  2) New FGR, EFW 9.3% and AC 9%; BPP 6/8 (non-sustained resp), nl dopplers; per JO, reassuring NST today gives 8/10 and may f/u as sched with twice weekly testing  Meds: No orders of the defined types were placed in this encounter.   Labs/procedures today: NST, U/S, and labs  Treatment Plan:  twice weekly testing; growth q 4wks  Reviewed: Preterm labor symptoms and general obstetric precautions including but not limited to vaginal bleeding, contractions, leaking of fluid and fetal movement were reviewed in detail with the patient.   All questions were answered. Does have home bp cuff. Office bp cuff given: not applicable. Check bp weekly, let us  know if consistently >140 and/or >90.  Follow-up: Return for As scheduled; plus add in weekly NSTs for twice-weekly testing.   Future Appointments  Date Time Provider Department Center  12/15/2023  3:30 PM [facility] NURSE [facility]  12/19/2023 11:30 AM [facility] US  [facility] None  12/19/2023  1:30 PM Keene Pastures, DO [facility]  12/22/2023 10:30 AM Wendel Hals, NP [facility] None  12/22/2023  3:30 PM [facility] NURSE [facility]  12/26/2023  9:15 AM [facility] US  [facility] None  12/26/2023 10:10 AM Wendelyn Halter, MD [facility]  12/29/2023  3:30 PM [facility] NURSE [facility]  01/03/2024  1:30 PM [facility]G 2 [facility] None  01/03/2024  2:30 PM Wendelyn Halter, MD [facility]  01/06/2024  9:10 AM [facility] NURSE [facility]  01/09/2024 10:45 AM [facility] US  [facility] None  01/09/2024 11:30 AM Keene Pastures, DO [facility]  01/12/2024  2:30 PM [facility] NURSE [facility]  01/16/2024  1:30 PM [facility] IMG 2 [facility] None  01/16/2024  2:30 PM Wendelyn Halter, MD [facility]  01/19/2024  3:30 PM [facility] NURSE [facility]  01/23/2024  1:30 PM [facility] IMG 2 [facility] None  01/23/2024  2:30 PM Keene Pastures, DO [facility]  01/26/2024  9:30 AM [facility] NURSE [facility]  01/30/2024 10:45 AM [facility] US  [facility] None  01/30/2024 11:30 AM Cresenzo-Dishmon, Blanca Bunch, CNM [facility]    Orders Placed This Encounter  Procedures   TSH   T3, free   T4, free   Jolayne Natter [facility] 12/12/2023 4:13 PM

## 2023-12-12 NOTE — Progress Notes (Signed)
 US : GA = 32+1 weeks Single very active female fetus, cephalic, FHR = 141 bpm, AFI = 11 cm, 17%, MVP = 4.4 cm, EFW 9%, AC 9%,  BPP = 6/8, few respirations seen but not sustained x 30 seconds  RI: 0.70, 0.68  79%  with good EDF   excellent fetal movements seen

## 2023-12-13 LAB — T4, FREE: Free T4: 0.92 ng/dL (ref 0.82–1.77)

## 2023-12-13 LAB — T3, FREE: T3, Free: 2.8 pg/mL (ref 2.0–4.4)

## 2023-12-13 LAB — TSH: TSH: 0.882 u[IU]/mL (ref 0.450–4.500)

## 2023-12-15 ENCOUNTER — Other Ambulatory Visit

## 2023-12-16 ENCOUNTER — Other Ambulatory Visit: Payer: Self-pay | Admitting: Women's Health

## 2023-12-16 DIAGNOSIS — E0590 Thyrotoxicosis, unspecified without thyrotoxic crisis or storm: Secondary | ICD-10-CM

## 2023-12-16 DIAGNOSIS — O365990 Maternal care for other known or suspected poor fetal growth, unspecified trimester, not applicable or unspecified: Secondary | ICD-10-CM

## 2023-12-16 DIAGNOSIS — O0992 Supervision of high risk pregnancy, unspecified, second trimester: Secondary | ICD-10-CM

## 2023-12-19 ENCOUNTER — Encounter: Payer: Self-pay | Admitting: Obstetrics & Gynecology

## 2023-12-19 ENCOUNTER — Ambulatory Visit: Admitting: Obstetrics & Gynecology

## 2023-12-19 ENCOUNTER — Ambulatory Visit (INDEPENDENT_AMBULATORY_CARE_PROVIDER_SITE_OTHER)

## 2023-12-19 VITALS — BP 132/81 | HR 89 | Wt 171.2 lb

## 2023-12-19 DIAGNOSIS — O99283 Endocrine, nutritional and metabolic diseases complicating pregnancy, third trimester: Secondary | ICD-10-CM

## 2023-12-19 DIAGNOSIS — Z3A33 33 weeks gestation of pregnancy: Secondary | ICD-10-CM

## 2023-12-19 DIAGNOSIS — O365930 Maternal care for other known or suspected poor fetal growth, third trimester, not applicable or unspecified: Secondary | ICD-10-CM | POA: Diagnosis not present

## 2023-12-19 DIAGNOSIS — O0990 Supervision of high risk pregnancy, unspecified, unspecified trimester: Secondary | ICD-10-CM

## 2023-12-19 DIAGNOSIS — O0993 Supervision of high risk pregnancy, unspecified, third trimester: Secondary | ICD-10-CM

## 2023-12-19 DIAGNOSIS — E0590 Thyrotoxicosis, unspecified without thyrotoxic crisis or storm: Secondary | ICD-10-CM

## 2023-12-19 DIAGNOSIS — O365990 Maternal care for other known or suspected poor fetal growth, unspecified trimester, not applicable or unspecified: Secondary | ICD-10-CM

## 2023-12-19 DIAGNOSIS — O0992 Supervision of high risk pregnancy, unspecified, second trimester: Secondary | ICD-10-CM

## 2023-12-19 NOTE — Written Directive (Signed)
 US  33+1 wks,cephalic,anterior left lateral placenta gr 1,FHR 130 bpm,RI .76,.71,.71=93%,BPP 8/8,AFI 15 cm

## 2023-12-19 NOTE — Progress Notes (Signed)
 HIGH-RISK PREGNANCY VISIT Patient name: Kristen Mejia MRN [id number]  Date of birth: [birth year] Chief Complaint:   Routine Prenatal Visit  History of Present Illness:   Kristen Mejia is a 23 y.o. G1P0 female at [redacted]w[redacted]d with an Estimated Date of Delivery: 02/05/24 being seen today for ongoing management of a high-risk pregnancy complicated by:  -FGR -Hyperthyroidism  Today she reports no complaints.   Contractions: Not present. Vag. Bleeding: None.  Movement: Present. denies leaking of fluid.      10/31/2023    9:27 AM 10/14/2023    4:00 PM 10/10/2023    9:11 AM 07/27/2023   10:28 AM 06/10/2023    9:22 AM  Depression screen PHQ 2/9  Decreased Interest 0 0 0 0 0  Down, Depressed, Hopeless 0 0 0 0 0  PHQ - 2 Score 0 0 0 0 0  Altered sleeping 2   2 0  Tired, decreased energy 2   3 0  Change in appetite 3   2 0  Feeling bad or failure about yourself  0   0 0  Trouble concentrating 0   0 0  Moving slowly or fidgety/restless 0   0 0  Suicidal thoughts 0   0 0  PHQ-9 Score 7   7 0  Difficult doing work/chores     Not difficult at all     Current Outpatient Medications  Medication Instructions   cetirizine  (ZYRTEC  ALLERGY ) 10 mg, Oral, Daily   fluticasone  (FLONASE ) 50 MCG/ACT nasal spray 2 sprays, Each Nare, Daily   methimazole  (TAPAZOLE ) 5 mg, Oral, Daily with breakfast   pantoprazole  (PROTONIX ) 20 mg, Oral, Daily   Prenatal Vit-Fe Fumarate-FA (PRENATAL VITAMIN PO) Take by mouth.   pyridOXINE  (VITAMIN B6) 100 mg, Daily     Review of Systems:   Pertinent items are noted in HPI Denies abnormal vaginal discharge w/ itching/odor/irritation, headaches, visual changes, shortness of breath, chest pain, abdominal pain, severe nausea/vomiting, or problems with urination or bowel movements unless otherwise stated above. Pertinent History Reviewed:  Reviewed past medical,surgical, social, obstetrical and family history.  Reviewed problem list, medications and allergies. Physical  Assessment:   Vitals:   12/19/23 1208  BP: 132/81  Pulse: 89  Weight: 171 lb 3.2 oz (77.7 kg)  Body mass index is 28.49 kg/m.           Physical Examination:   General appearance: alert, well appearing, and in no distress  Mental status: normal mood, behavior, speech, dress, motor activity, and thought processes  Skin: warm & dry   Extremities:      Cardiovascular: normal heart rate noted  Respiratory: normal respiratory effort, no distress  Abdomen: gravid, soft, non-tender  Pelvic: Cervical exam deferred         Fetal Status:     Movement: Present    Fetal Surveillance Testing today: cephalic,anterior left lateral placenta gr 1,FHR 130 bpm,RI .76,.71,.71=93%,BPP 8/8,AFI 15 cm    Chaperone: N/A    No results found for this or any previous visit (from the past 24 hours).   Assessment & Plan:  High-risk pregnancy: G1P0 at [redacted]w[redacted]d with an Estimated Date of Delivery: 02/05/24   1. FGR -reviewed today's doppler, discussed IOL may change pending -for now likely 38wks though may be 37wk if >95% -continue twice weekly testing  2. Supervision of high risk pregnancy, antepartum (Primary) -routine OB care  3. Hyperthyroidism -labs stable -continue current meds  Meds: No orders of the defined types were placed in this  encounter.   Labs/procedures today: doppler, BPP 8/8  Treatment Plan:  as outlined above  Reviewed: Preterm labor symptoms and general obstetric precautions including but not limited to vaginal bleeding, contractions, leaking of fluid and fetal movement were reviewed in detail with the patient.  All questions were answered. Pt has home bp cuff. Check bp weekly, let us  know if >140/90.   Follow-up: No follow-ups on file.   Future Appointments  Date Time Provider Department Center  12/19/2023  1:30 PM Keene Pastures, DO [facility]  12/22/2023 10:30 AM Wendel Hals, NP [facility] None  12/22/2023  3:30 PM [facility] NURSE [facility]  12/26/2023  9:15  AM [facility] US  [facility] None  12/26/2023 10:10 AM Wendelyn Halter, MD [facility]  12/29/2023  3:30 PM [facility] N[facility]  01/03/2024  1:30 PM [facility] IMG 2 [facility] None  01/03/2024  2:30 PM Wendelyn Halter, MD [facility]  01/06/2024  9:10 AM [facility] NURSE [facility]  01/09/2024 10:45 AM [facility] US  [facility] None  01/09/2024 11:30 AM Keene Pastures, DO [facility]  01/12/2024  2:30 PM [facility] NURSE [facility]  01/16/2024  1:30 PM [facility] IMG 2 [facility] None  01/16/2024  2:30 PM Wendelyn Halter, MD [facility]  01/19/2024  3:30 PM [facility] NURSE [facility]  01/23/2024  1:30 PM [facility] IMG 2 [facility] None  01/23/2024  2:30 PM Keene Pastures, DO [facility]  01/26/2024  9:30 AM [facility] NURSE [facility]  01/30/2024 10:45 AM [facility] US  [facility] None  01/30/2024 11:30 AM Cresenzo-Dishmon, Blanca Bunch, CNM [facility]    No orders of the defined types were placed in this encounter.   Cire Deyarmin, DO Attending Obstetrician & Gynecologist, [place] for [employer], [facility]

## 2023-12-21 ENCOUNTER — Ambulatory Visit: Admitting: Nurse Practitioner

## 2023-12-21 ENCOUNTER — Encounter: Payer: Self-pay | Admitting: Nurse Practitioner

## 2023-12-21 ENCOUNTER — Encounter: Payer: Self-pay | Admitting: Obstetrics & Gynecology

## 2023-12-21 VITALS — BP 120/77 | HR 71 | Temp 97.7°F | Ht 65.0 in | Wt 171.0 lb

## 2023-12-21 DIAGNOSIS — N898 Other specified noninflammatory disorders of vagina: Secondary | ICD-10-CM | POA: Diagnosis not present

## 2023-12-21 DIAGNOSIS — Z3A33 33 weeks gestation of pregnancy: Secondary | ICD-10-CM | POA: Insufficient documentation
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
## 2023-12-22 ENCOUNTER — Other Ambulatory Visit

## 2023-12-22 ENCOUNTER — Ambulatory Visit (INDEPENDENT_AMBULATORY_CARE_PROVIDER_SITE_OTHER): Admitting: Nurse Practitioner

## 2023-12-22 ENCOUNTER — Encounter: Payer: Self-pay | Admitting: Nurse Practitioner

## 2023-12-22 ENCOUNTER — Ambulatory Visit (INDEPENDENT_AMBULATORY_CARE_PROVIDER_SITE_OTHER): Admitting: *Deleted

## 2023-12-22 VITALS — BP 134/81 | HR 92 | Wt 171.0 lb

## 2023-12-22 VITALS — BP 112/82 | HR 71 | Ht 65.0 in | Wt 169.8 lb

## 2023-12-22 DIAGNOSIS — O365930 Maternal care for other known or suspected poor fetal growth, third trimester, not applicable or unspecified: Secondary | ICD-10-CM | POA: Diagnosis not present

## 2023-12-22 DIAGNOSIS — Z8349 Family history of other endocrine, nutritional and metabolic diseases: Secondary | ICD-10-CM

## 2023-12-22 DIAGNOSIS — Z3A33 33 weeks gestation of pregnancy: Secondary | ICD-10-CM | POA: Diagnosis not present

## 2023-12-22 DIAGNOSIS — E0590 Thyrotoxicosis, unspecified without thyrotoxic crisis or storm: Secondary | ICD-10-CM | POA: Diagnosis not present

## 2023-12-22 DIAGNOSIS — O365990 Maternal care for other known or suspected poor fetal growth, unspecified trimester, not applicable or unspecified: Secondary | ICD-10-CM

## 2023-12-22 MED ORDER — METHIMAZOLE 5 MG PO TABS: 2.5000 mg | ORAL_TABLET | Freq: Every day | ORAL | Status: DC
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
## 2023-12-22 NOTE — Progress Notes (Signed)
   NURSE VISIT- NST  SUBJECTIVE:  Kristen Mejia is a 23 y.o. G1P0 female at [redacted]w[redacted]d, here for a NST for pregnancy complicated by FGR.  She reports active fetal movement, contractions: none, vaginal bleeding: none, membranes: intact.   OBJECTIVE:  BP 134/81   Pulse 92   Wt 171 lb (77.6 kg)   BMI 28.46 kg/m   Appears well, no apparent distress  No results found for this or any previous visit (from the past 24 hours).  NST: FHR baseline 130 bpm, Variability: moderate, Accelerations:present, Decelerations:  Absent= Cat 1/reactive Toco: none   ASSESSMENT: G1P0 at [redacted]w[redacted]d with FGR NST reactive  PLAN: EFM strip reviewed by Dr. Ozan   Recommendations: keep next appointment as scheduled    Kerrie Peek  12/22/2023 9:22 AM

## 2023-12-23 ENCOUNTER — Other Ambulatory Visit: Payer: Self-pay | Admitting: Women's Health

## 2023-12-23 DIAGNOSIS — E0590 Thyrotoxicosis, unspecified without thyrotoxic crisis or storm: Secondary | ICD-10-CM

## 2023-12-23 DIAGNOSIS — O365990 Maternal care for other known or suspected poor fetal growth, unspecified trimester, not applicable or unspecified: Secondary | ICD-10-CM

## 2023-12-23 DIAGNOSIS — O0993 Supervision of high risk pregnancy, unspecified, third trimester: Secondary | ICD-10-CM

## 2023-12-26 ENCOUNTER — Ambulatory Visit

## 2023-12-26 ENCOUNTER — Ambulatory Visit: Admitting: Obstetrics & Gynecology

## 2023-12-26 ENCOUNTER — Ambulatory Visit: Admitting: Nurse Practitioner

## 2023-12-26 ENCOUNTER — Encounter: Payer: Self-pay | Admitting: Obstetrics & Gynecology

## 2023-12-26 VITALS — BP 114/88 | HR 88 | Wt 172.0 lb

## 2023-12-26 DIAGNOSIS — Z3A34 34 weeks gestation of pregnancy: Secondary | ICD-10-CM | POA: Diagnosis not present

## 2023-12-26 DIAGNOSIS — O365930 Maternal care for other known or suspected poor fetal growth, third trimester, not applicable or unspecified: Secondary | ICD-10-CM

## 2023-12-26 DIAGNOSIS — O0990 Supervision of high risk pregnancy, unspecified, unspecified trimester: Secondary | ICD-10-CM

## 2023-12-26 DIAGNOSIS — E0590 Thyrotoxicosis, unspecified without thyrotoxic crisis or storm: Secondary | ICD-10-CM

## 2023-12-26 DIAGNOSIS — O0993 Supervision of high risk pregnancy, unspecified, third trimester: Secondary | ICD-10-CM | POA: Diagnosis not present

## 2023-12-26 DIAGNOSIS — O99283 Endocrine, nutritional and metabolic diseases complicating pregnancy, third trimester: Secondary | ICD-10-CM | POA: Diagnosis not present

## 2023-12-26 DIAGNOSIS — O365990 Maternal care for other known or suspected poor fetal growth, unspecified trimester, not applicable or unspecified: Secondary | ICD-10-CM

## 2023-12-26 NOTE — Progress Notes (Signed)
 US  34+1 wks,cephalic,anterior left lateral placenta gr 1,AFI 13 cm,BPP 8/8,RI .65,.64,.64=74%

## 2023-12-26 NOTE — Progress Notes (Signed)
 HIGH-RISK PREGNANCY VISIT Patient name: Kristen Mejia MRN [id number]  Date of birth: 22-Oct-2000 Chief Complaint:   Routine Prenatal Visit  History of Present Illness:   Kristen Mejia is a 23 y.o. G1P0 female at [redacted]w[redacted]d with an Estimated Date of Delivery: 02/05/24 being seen today for ongoing management of a high-risk pregnancy complicated by FGR with normal UAD.    Today she reports no complaints. Contractions: Not present. Vag. Bleeding: None.  Movement: Present. denies leaking of fluid.      10/31/2023    9:27 AM 10/14/2023    4:00 PM 10/10/2023    9:11 AM 07/27/2023   10:28 AM 06/10/2023    9:22 AM  Depression screen PHQ 2/9  Decreased Interest 0 0 0 0 0  Down, Depressed, Hopeless 0 0 0 0 0  PHQ - 2 Score 0 0 0 0 0  Altered sleeping 2   2 0  Tired, decreased energy 2   3 0  Change in appetite 3   2 0  Feeling bad or failure about yourself  0   0 0  Trouble concentrating 0   0 0  Moving slowly or fidgety/restless 0   0 0  Suicidal thoughts 0   0 0  PHQ-9 Score 7   7 0  Difficult doing work/chores     Not difficult at all        10/14/2023    4:00 PM 07/27/2023   10:29 AM 06/10/2023    9:22 AM 02/07/2023    2:28 PM  GAD 7 : Generalized Anxiety Score  Nervous, Anxious, on Edge 0 0 0 0  Control/stop worrying 0 0 0 0  Worry too much - different things 0 0 0 0  Trouble relaxing 0 0 0 0  Restless 0 0 0 0  Easily annoyed or irritable 0 0 0 0  Afraid - awful might happen 0 0 0 0  Total GAD 7 Score 0 0 0 0  Anxiety Difficulty Not difficult at all  Not difficult at all Not difficult at all     Review of Systems:   Pertinent items are noted in HPI Denies abnormal vaginal discharge w/ itching/odor/irritation, headaches, visual changes, shortness of breath, chest pain, abdominal pain, severe nausea/vomiting, or problems with urination or bowel movements unless otherwise stated above. Pertinent History Reviewed:  Reviewed past medical,surgical, social, obstetrical and family history.   Reviewed problem list, medications and allergies. Physical Assessment:   Vitals:   12/26/23 0954  BP: 114/88  Pulse: 88  Weight: 172 lb (78 kg)  Body mass index is 28.62 kg/m.           Physical Examination:   General appearance: alert, well appearing, and in no distress  Mental status: alert, oriented to person, place, and time  Skin: warm & dry   Extremities:      Cardiovascular: normal heart rate noted  Respiratory: normal respiratory effort, no distress  Abdomen: gravid, soft, non-tender  Pelvic: Cervical exam deferred         Fetal Status:     Movement: Present    Fetal Surveillance Testing today: BPP 8/8, UAD 74%   Chaperone:     No results found for this or any previous visit (from the past 24 hours).  Assessment & Plan:  High-risk pregnancy: G1P0 at [redacted]w[redacted]d with an Estimated Date of Delivery: 02/05/24      ICD-10-CM   1. Supervision of high risk pregnancy, antepartum  O09.90  2. Fetal growth restriction: 9% normal UAD  O36.5990     3. Hyperthyroidism: Methimazole  2.5 mg daily with normal studies  E05.90         Meds: No orders of the defined types were placed in this encounter.   Orders: No orders of the defined types were placed in this encounter.    Labs/procedures today: U/S  Treatment Plan:  twice weekly surveillance    Follow-up: Return for keep scheduled.   Future Appointments  Date Time Provider Department Center  12/29/2023  3:30 PM [facility] NURSE [facility]  01/03/2024  1:30 PM [facility] 2 [facility] None  01/03/2024  2:30 PM Wendelyn Halter, MD [facility]  01/06/2024  9:10 AM [facility] NURSE [facility]  01/09/2024 10:00 AM [facility] US  [facility] None  01/09/2024 10:50 AM Keene Pastures, DO [facility]  01/12/2024  2:30 PM [facility] NURSE [facility]  01/16/2024  1:30 PM [facility] 2 [facility] None  01/16/2024  2:30 PM Wendelyn Halter, MD [facility]  01/19/2024  3:30 PM [facility] NURSE [facility]   01/23/2024  1:30 PM [facility] 2 [facility] None  01/23/2024  2:30 PM Keene Pastures, DO [facility]  01/26/2024  9:30 AM [facility] NURSE [facility]  01/30/2024 10:45 AM [facility] US  [facility] None  01/30/2024 11:30 AM Cresenzo-Dishmon, Blanca Bunch, CNM [facility]  03/26/2024  1:00 PM Wendel Hals, NP [facility] None    No orders of the defined types were placed in this encounter.  Wendelyn Halter  Attending Physician for the Center for [facility] 12/26/2023 10:27 AM

## 2023-12-27 ENCOUNTER — Encounter: Payer: Self-pay | Admitting: Obstetrics & Gynecology

## 2023-12-27 ENCOUNTER — Encounter: Payer: Self-pay | Admitting: Women's Health

## 2023-12-28 ENCOUNTER — Other Ambulatory Visit: Payer: Self-pay | Admitting: Women's Health

## 2023-12-28 DIAGNOSIS — O0993 Supervision of high risk pregnancy, unspecified, third trimester: Secondary | ICD-10-CM

## 2023-12-28 DIAGNOSIS — O365990 Maternal care for other known or suspected poor fetal growth, unspecified trimester, not applicable or unspecified: Secondary | ICD-10-CM

## 2023-12-28 DIAGNOSIS — Z331 Pregnant state, incidental: Secondary | ICD-10-CM

## 2023-12-28 DIAGNOSIS — E069 Thyroiditis, unspecified: Secondary | ICD-10-CM

## 2023-12-28 DIAGNOSIS — E0590 Thyrotoxicosis, unspecified without thyrotoxic crisis or storm: Secondary | ICD-10-CM

## 2023-12-28 DIAGNOSIS — O0992 Supervision of high risk pregnancy, unspecified, second trimester: Secondary | ICD-10-CM

## 2023-12-28 DIAGNOSIS — O0990 Supervision of high risk pregnancy, unspecified, unspecified trimester: Secondary | ICD-10-CM

## 2023-12-28 DIAGNOSIS — Z1389 Encounter for screening for other disorder: Secondary | ICD-10-CM

## 2023-12-29 ENCOUNTER — Other Ambulatory Visit

## 2023-12-29 ENCOUNTER — Encounter: Payer: Self-pay | Admitting: Advanced Practice Midwife

## 2023-12-29 ENCOUNTER — Ambulatory Visit

## 2023-12-29 VITALS — BP 134/89 | HR 78 | Wt 167.0 lb

## 2023-12-29 DIAGNOSIS — Z3A34 34 weeks gestation of pregnancy: Secondary | ICD-10-CM

## 2023-12-29 DIAGNOSIS — O0990 Supervision of high risk pregnancy, unspecified, unspecified trimester: Secondary | ICD-10-CM

## 2023-12-29 DIAGNOSIS — O0993 Supervision of high risk pregnancy, unspecified, third trimester: Secondary | ICD-10-CM

## 2023-12-29 NOTE — Progress Notes (Signed)
   NURSE VISIT- NST  SUBJECTIVE:  Kristen Mejia is a 23 y.o. G1P0 female at [redacted]w[redacted]d, here for a NST for pregnancy complicated by FGR.  She reports active fetal movement, contractions: none, vaginal bleeding: none, membranes: intact.   OBJECTIVE:    Appears well, no apparent distress  No results found for this or any previous visit (from the past 24 hours).  NST: FHR baseline 125 bpm, Variability: moderate, Accelerations:present, Decelerations:  Absent= Cat 1/reactive Toco: none   ASSESSMENT: G1P0 at [redacted]w[redacted]d with FGR NST reactive  PLAN: EFM strip reviewed by Dr. Randolm Butte   Recommendations: keep next appointment as scheduled    Alyssa Jumper  12/29/2023 3:45 PM

## 2024-01-03 ENCOUNTER — Ambulatory Visit: Admitting: Radiology

## 2024-01-03 ENCOUNTER — Ambulatory Visit: Admitting: Obstetrics & Gynecology

## 2024-01-03 VITALS — BP 130/90 | Wt 160.5 lb

## 2024-01-03 DIAGNOSIS — E0590 Thyrotoxicosis, unspecified without thyrotoxic crisis or storm: Secondary | ICD-10-CM

## 2024-01-03 DIAGNOSIS — O365930 Maternal care for other known or suspected poor fetal growth, third trimester, not applicable or unspecified: Secondary | ICD-10-CM

## 2024-01-03 DIAGNOSIS — O99283 Endocrine, nutritional and metabolic diseases complicating pregnancy, third trimester: Secondary | ICD-10-CM

## 2024-01-03 DIAGNOSIS — O365990 Maternal care for other known or suspected poor fetal growth, unspecified trimester, not applicable or unspecified: Secondary | ICD-10-CM

## 2024-01-03 DIAGNOSIS — R030 Elevated blood-pressure reading, without diagnosis of hypertension: Secondary | ICD-10-CM | POA: Diagnosis not present

## 2024-01-03 DIAGNOSIS — O99282 Endocrine, nutritional and metabolic diseases complicating pregnancy, second trimester: Secondary | ICD-10-CM

## 2024-01-03 DIAGNOSIS — O0993 Supervision of high risk pregnancy, unspecified, third trimester: Secondary | ICD-10-CM

## 2024-01-03 DIAGNOSIS — Z3A35 35 weeks gestation of pregnancy: Secondary | ICD-10-CM | POA: Diagnosis not present

## 2024-01-03 DIAGNOSIS — O0990 Supervision of high risk pregnancy, unspecified, unspecified trimester: Secondary | ICD-10-CM

## 2024-01-03 NOTE — Progress Notes (Signed)
 US : GA = 35+2 weeks Single active female fetus, cephalic, FHR = 149 bpm, anterior pl, gr3, AFI = 10.4 cm, 17% MVP = 3.3 cm, EFW 4 %, 2046g, BPP = 8/8, RI: 0.70, 0.61  76%  good EDF

## 2024-01-03 NOTE — Progress Notes (Signed)
 HIGH-RISK PREGNANCY VISIT Patient name: Kristen Mejia MRN [id number]  Date of birth: 2001-01-25 Chief Complaint:   Routine Prenatal Visit  History of Present Illness:   Kristen Mejia is a 23 y.o. G1P0 female at [redacted]w[redacted]d with an Estimated Date of Delivery: 02/05/24 being seen today for ongoing management of a high-risk pregnancy complicated by FGR .    Today she reports no complaints. Contractions: Not present. Vag. Bleeding: None.  Movement: Present. denies leaking of fluid.      10/31/2023    9:27 AM 10/14/2023    4:00 PM 10/10/2023    9:11 AM 07/27/2023   10:28 AM 06/10/2023    9:22 AM  Depression screen PHQ 2/9  Decreased Interest 0 0 0 0 0  Down, Depressed, Hopeless 0 0 0 0 0  PHQ - 2 Score 0 0 0 0 0  Altered sleeping 2   2 0  Tired, decreased energy 2   3 0  Change in appetite 3   2 0  Feeling bad or failure about yourself  0   0 0  Trouble concentrating 0   0 0  Moving slowly or fidgety/restless 0   0 0  Suicidal thoughts 0   0 0  PHQ-9 Score 7   7 0  Difficult doing work/chores     Not difficult at all        10/14/2023    4:00 PM 07/27/2023   10:29 AM 06/10/2023    9:22 AM 02/07/2023    2:28 PM  GAD 7 : Generalized Anxiety Score  Nervous, Anxious, on Edge 0 0 0 0  Control/stop worrying 0 0 0 0  Worry too much - different things 0 0 0 0  Trouble relaxing 0 0 0 0  Restless 0 0 0 0  Easily annoyed or irritable 0 0 0 0  Afraid - awful might happen 0 0 0 0  Total GAD 7 Score 0 0 0 0  Anxiety Difficulty Not difficult at all  Not difficult at all Not difficult at all     Review of Systems:   Pertinent items are noted in HPI Denies abnormal vaginal discharge w/ itching/odor/irritation, headaches, visual changes, shortness of breath, chest pain, abdominal pain, severe nausea/vomiting, or problems with urination or bowel movements unless otherwise stated above. Pertinent History Reviewed:  Reviewed past medical,surgical, social, obstetrical and family history.  Reviewed  problem list, medications and allergies. Physical Assessment:   Vitals:   01/03/24 1410 01/03/24 1411  BP: (!) 131/91 (!) 130/90  Weight: 160 lb 8 oz (72.8 kg)   Body mass index is 26.71 kg/m.           Physical Examination:   General appearance: alert, well appearing, and in no distress  Mental status: alert, oriented to person, place, and time  Skin: warm & dry   Extremities:      Cardiovascular: normal heart rate noted  Respiratory: normal respiratory effort, no distress  Abdomen: gravid, soft, non-tender  Pelvic: Cervical exam deferred         Fetal Status:     Movement: Present    Fetal Surveillance Testing today: BPP 8/8 AC 1.1% UAD normal   Chaperone: N/A    No results found for this or any previous visit (from the past 24 hours).  Assessment & Plan:  High-risk pregnancy: G1P0 at [redacted]w[redacted]d with an Estimated Date of Delivery: 02/05/24      ICD-10-CM   1. Supervision of high risk pregnancy,  antepartum  O09.90     2. Fetal growth restriction: 4.2% EFW (down from 9) AC 1.1% UAD normal  O36.5990     3. Elevated blood pressure readings:  shie ias 130s/80-90 at home so also probably going to have diagnosis of gHTN  R03.0     4. Hyperthyroidism: Methimazole  2.5 mg daily with normal studies  E05.90         Meds: No orders of the defined types were placed in this encounter.   Orders: No orders of the defined types were placed in this encounter.    Labs/procedures today: U/S  Treatment Plan:  keep scheduled, IOL 37  weeks 01/15/24 due to [facility] 1.1% and creeping up BP  Reviewed: Preterm labor symptoms and general obstetric precautions including but not limited to vaginal bleeding, contractions, leaking of fluid and fetal movement were reviewed in detail with the patient.  All questions were answered. Does have home bp cuff. Office bp cuff given: not applicable. Check bp twice daily, let us  know if consistently >140 and/or >90.  Follow-up: Return for keep scheduled.   Future  Appointments  Date Time Provider Department Center  01/06/2024  9:10 AM [facility] NURSE [facility]  01/09/2024 10:00 AM [facility] US  [facility] None  01/09/2024 10:50 AM Keene Pastures, DO [facility]  01/12/2024  2:30 PM [facility] NURSE [facility]  01/16/2024  1:30 PM [facility] IMG 2 [facility] None  01/16/2024  2:30 PM Wendelyn Halter, MD [facility]  01/19/2024  3:30 PM [facility] NURSE [facility]  01/23/2024  1:30 PM [facility] IMG 2 [facility] None  01/23/2024  2:30 PM Keene Pastures, DO [facility]  01/26/2024  9:30 AM [facility] NURSE [facility]  01/30/2024 10:45 AM [facility] US  [facility] None  01/30/2024 11:30 AM Cresenzo-Dishmon, Blanca Bunch, CNM [facility]  03/26/2024  1:00 PM Wendel Hals, NP [facility] None    No orders of the defined types were placed in this encounter.  Wendelyn Halter  Attending Physician for the Center for [facility] 01/03/2024 3:14 PM

## 2024-01-04 ENCOUNTER — Encounter: Payer: Self-pay | Admitting: Family Medicine

## 2024-01-04 ENCOUNTER — Ambulatory Visit (INDEPENDENT_AMBULATORY_CARE_PROVIDER_SITE_OTHER): Admitting: Family Medicine

## 2024-01-04 VITALS — BP 127/90 | HR 97 | Temp 97.7°F | Ht 63.0 in | Wt 168.4 lb

## 2024-01-04 DIAGNOSIS — J029 Acute pharyngitis, unspecified: Secondary | ICD-10-CM

## 2024-01-04 DIAGNOSIS — Z3493 Encounter for supervision of normal pregnancy, unspecified, third trimester: Secondary | ICD-10-CM
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
## 2024-01-04 NOTE — Patient Instructions (Signed)

## 2024-01-06 ENCOUNTER — Other Ambulatory Visit

## 2024-01-06 ENCOUNTER — Ambulatory Visit: Admitting: *Deleted

## 2024-01-06 ENCOUNTER — Other Ambulatory Visit: Payer: Self-pay | Admitting: Women's Health

## 2024-01-06 VITALS — BP 129/88 | HR 93 | Wt 169.0 lb

## 2024-01-06 DIAGNOSIS — Z1389 Encounter for screening for other disorder: Secondary | ICD-10-CM

## 2024-01-06 DIAGNOSIS — O0993 Supervision of high risk pregnancy, unspecified, third trimester: Secondary | ICD-10-CM | POA: Diagnosis not present

## 2024-01-06 DIAGNOSIS — O365930 Maternal care for other known or suspected poor fetal growth, third trimester, not applicable or unspecified: Secondary | ICD-10-CM

## 2024-01-06 DIAGNOSIS — O365990 Maternal care for other known or suspected poor fetal growth, unspecified trimester, not applicable or unspecified: Secondary | ICD-10-CM

## 2024-01-06 DIAGNOSIS — E0590 Thyrotoxicosis, unspecified without thyrotoxic crisis or storm: Secondary | ICD-10-CM

## 2024-01-06 DIAGNOSIS — Z3A35 35 weeks gestation of pregnancy: Secondary | ICD-10-CM

## 2024-01-06 DIAGNOSIS — Z331 Pregnant state, incidental: Secondary | ICD-10-CM

## 2024-01-06 DIAGNOSIS — O0990 Supervision of high risk pregnancy, unspecified, unspecified trimester: Secondary | ICD-10-CM

## 2024-01-06 LAB — POCT URINALYSIS DIPSTICK OB
Blood, UA: NEGATIVE
Glucose, UA: NEGATIVE
Ketones, UA: NEGATIVE
Leukocytes, UA: NEGATIVE
Nitrite, UA: NEGATIVE

## 2024-01-06 LAB — COVID-19, FLU A+B AND RSV
Influenza A, NAA: NOT DETECTED
Influenza B, NAA: NOT DETECTED
RSV, NAA: NOT DETECTED
SARS-CoV-2, NAA: NOT DETECTED

## 2024-01-06 NOTE — Progress Notes (Signed)
   NURSE VISIT- NST  SUBJECTIVE:  Kristen Mejia is a 23 y.o. G1P0 female at [redacted]w[redacted]d, here for a NST for pregnancy complicated by FGR.  She reports active fetal movement, contractions: none, vaginal bleeding: none, membranes: intact.   OBJECTIVE:  BP 129/88   Pulse 93   Wt 169 lb (76.7 kg)   BMI 29.94 kg/m   Appears well, no apparent distress  No results found for this or any previous visit (from the past 24 hours).  NST: FHR baseline 145 bpm, Variability: moderate, Accelerations:present, Decelerations:  Absent= Cat 1/reactive Toco: none   ASSESSMENT: G1P0 at [redacted]w[redacted]d with FGR NST reactive  PLAN: EFM strip reviewed by Dr. Randolm Butte   Recommendations: keep next appointment as scheduled    Laverne Potter  01/06/2024 10:47 AM

## 2024-01-09 ENCOUNTER — Other Ambulatory Visit (HOSPITAL_COMMUNITY)
Admission: RE | Admit: 2024-01-09 | Discharge: 2024-01-09 | Disposition: A | Discharge status: Home / Self Care | Source: Ambulatory Visit | Attending: Obstetrics & Gynecology | Admitting: Obstetrics & Gynecology

## 2024-01-09 ENCOUNTER — Ambulatory Visit: Admitting: Obstetrics & Gynecology

## 2024-01-09 ENCOUNTER — Ambulatory Visit: Payer: Self-pay | Admitting: Family Medicine

## 2024-01-09 ENCOUNTER — Ambulatory Visit

## 2024-01-09 ENCOUNTER — Encounter: Payer: Self-pay | Admitting: Obstetrics & Gynecology

## 2024-01-09 VITALS — BP 144/97 | HR 116 | Wt 170.8 lb

## 2024-01-09 DIAGNOSIS — O99283 Endocrine, nutritional and metabolic diseases complicating pregnancy, third trimester: Secondary | ICD-10-CM

## 2024-01-09 DIAGNOSIS — O0993 Supervision of high risk pregnancy, unspecified, third trimester: Secondary | ICD-10-CM

## 2024-01-09 DIAGNOSIS — O133 Gestational [pregnancy-induced] hypertension without significant proteinuria, third trimester: Secondary | ICD-10-CM | POA: Diagnosis not present

## 2024-01-09 DIAGNOSIS — Z3A36 36 weeks gestation of pregnancy: Secondary | ICD-10-CM | POA: Diagnosis not present

## 2024-01-09 DIAGNOSIS — H66003 Acute suppurative otitis media without spontaneous rupture of ear drum, bilateral: Secondary | ICD-10-CM

## 2024-01-09 DIAGNOSIS — O139 Gestational [pregnancy-induced] hypertension without significant proteinuria, unspecified trimester: Secondary | ICD-10-CM | POA: Insufficient documentation

## 2024-01-09 DIAGNOSIS — Z8759 Personal history of other complications of pregnancy, childbirth and the puerperium: Secondary | ICD-10-CM | POA: Insufficient documentation

## 2024-01-09 DIAGNOSIS — O365990 Maternal care for other known or suspected poor fetal growth, unspecified trimester, not applicable or unspecified: Secondary | ICD-10-CM

## 2024-01-09 DIAGNOSIS — H6993 Unspecified Eustachian tube disorder, bilateral: Secondary | ICD-10-CM

## 2024-01-09 DIAGNOSIS — Z113 Encounter for screening for infections with a predominantly sexual mode of transmission: Secondary | ICD-10-CM | POA: Diagnosis not present

## 2024-01-09 DIAGNOSIS — O0990 Supervision of high risk pregnancy, unspecified, unspecified trimester: Secondary | ICD-10-CM | POA: Diagnosis not present

## 2024-01-09 DIAGNOSIS — E0590 Thyrotoxicosis, unspecified without thyrotoxic crisis or storm: Secondary | ICD-10-CM

## 2024-01-09 DIAGNOSIS — O365930 Maternal care for other known or suspected poor fetal growth, third trimester, not applicable or unspecified: Secondary | ICD-10-CM | POA: Diagnosis not present

## 2024-01-09 DIAGNOSIS — K219 Gastro-esophageal reflux disease without esophagitis: Secondary | ICD-10-CM

## 2024-01-09 LAB — POCT URINALYSIS DIPSTICK OB
Bilirubin, UA: NEGATIVE
Glucose, UA: NEGATIVE
Ketones, UA: NEGATIVE
Nitrite, UA: NEGATIVE

## 2024-01-09 MED ORDER — PANTOPRAZOLE SODIUM 20 MG PO TBEC: 20.0000 mg | DELAYED_RELEASE_TABLET | Freq: Every day | ORAL | 3 refills | Status: DC

## 2024-01-09 MED ORDER — CETIRIZINE HCL 10 MG PO TABS: 10.0000 mg | ORAL_TABLET | Freq: Every day | ORAL | 1 refills | Status: DC

## 2024-01-09 NOTE — Progress Notes (Signed)
 Negative flu, covid, rsv. Follow up if symptoms continue.

## 2024-01-09 NOTE — Progress Notes (Signed)
 US  36+1 wks,cephalic,anterior placenta gr 3,BPP 8/8,FHR 133 bpm,AFI 10 cm,RI .64,.63,.61=75%

## 2024-01-09 NOTE — Progress Notes (Signed)
 HIGH-RISK PREGNANCY VISIT Patient name: Kristen Mejia MRN [id number]  Date of birth: 12-28-2000 Chief Complaint:   Routine Prenatal Visit  History of Present Illness:   Kristen Mejia is a 23 y.o. G1P0 female at [redacted]w[redacted]d with an Estimated Date of Delivery: 02/05/24 being seen today for ongoing management of a high-risk pregnancy complicated by:  -FGR -GestHTN -Hyperthyroidism  Today she reports notes mild/lingering headache- this has been present for a couple weeks.  No change in vision.  No RUQ pain.   Contractions: Not present. Vag. Bleeding: None.  Movement: Present. denies leaking of fluid.      10/31/2023    9:27 AM 10/14/2023    4:00 PM 10/10/2023    9:11 AM 07/27/2023   10:28 AM 06/10/2023    9:22 AM  Depression screen PHQ 2/9  Decreased Interest 0 0 0 0 0  Down, Depressed, Hopeless 0 0 0 0 0  PHQ - 2 Score 0 0 0 0 0  Altered sleeping 2   2 0  Tired, decreased energy 2   3 0  Change in appetite 3   2 0  Feeling bad or failure about yourself  0   0 0  Trouble concentrating 0   0 0  Moving slowly or fidgety/restless 0   0 0  Suicidal thoughts 0   0 0  PHQ-9 Score 7   7 0  Difficult doing work/chores     Not difficult at all     Current Outpatient Medications  Medication Instructions   cetirizine  (ZYRTEC  ALLERGY ) 10 mg, Oral, Daily   fluticasone  (FLONASE ) 50 MCG/ACT nasal spray 2 sprays, Each Nare, Daily   methimazole  (TAPAZOLE ) 2.5 mg, Oral, Daily with breakfast   pantoprazole  (PROTONIX ) 20 mg, Oral, Daily   Prenatal Vit-Fe Fumarate-FA (PRENATAL VITAMIN PO) Take by mouth.   pyridOXINE  (VITAMIN B6) 100 mg, Daily     Review of Systems:   Pertinent items are noted in HPI Denies abnormal vaginal discharge w/ itching/odor/irritation, shortness of breath, chest pain, abdominal pain, severe nausea/vomiting, or problems with urination or bowel movements unless otherwise stated above. Pertinent History Reviewed:  Reviewed past medical,surgical, social, obstetrical and family  history.  Reviewed problem list, medications and allergies. Physical Assessment:   Vitals:   01/09/24 1034 01/09/24 1058  BP: (!) 147/98 (!) 144/97  Pulse: 83 (!) 116  Weight: 170 lb 12.8 oz (77.5 kg)   Body mass index is 30.26 kg/m.           Physical Examination:   General appearance: alert, well appearing, and in no distress  Mental status: normal mood, behavior, speech, dress, motor activity, and thought processes  Skin: warm & dry   Extremities:   not examined   Cardiovascular: normal heart rate noted  Respiratory: normal respiratory effort, no distress  Abdomen: gravid, soft, non-tender  Pelvic: Cervical exam performed -  Dilation: Closed Effacement (%): Thick Station: -3  Fetal Status:     Movement: Present    Fetal Surveillance Testing today: ,cephalic,anterior placenta gr 3,BPP 8/8,FHR 133 bpm,AFI 10 cm,RI .64,.63,.61=75%    Chaperone: Lorean Rodes    Results for orders placed or performed in visit on 01/09/24 (from the past 24 hours)  POC Urinalysis Dipstick OB   Collection Time: 01/09/24 11:10 AM  Result Value Ref Range   Color, UA     Clarity, UA     Glucose, UA Negative Negative   Bilirubin, UA neg    Ketones, UA neg  Spec Grav, UA     Blood, UA trace    pH, UA     POC,PROTEIN,UA Small (1+) Negative, Trace, Small (1+), Moderate (2+), Large (3+), 4+   Urobilinogen, UA     Nitrite, UA neg    Leukocytes, UA Small (1+) (A) Negative   Appearance     Odor       Assessment & Plan:  High-risk pregnancy: G1P0 at [redacted]w[redacted]d with an Estimated Date of Delivery: 02/05/24   1. Gestational HTN -BP as above, reviewed preeclampsia precautions -lab work today -IOL scheduled for 37wks  2. Fetal growth restriction antepartum BPP 8/8, continue antepartum testing  4. Hyperthyroidism: Methimazole  2.5 mg daily with normal studies Continue current medication  5) Supervision of high risk pregnancy -refill on protonix  and zyrtec  -routine OB care   Meds:  Meds  ordered this encounter  Medications   pantoprazole  (PROTONIX ) 20 MG tablet    Sig: Take 1 tablet (20 mg total) by mouth daily.    Dispense:  30 tablet    Refill:  3   cetirizine  (ZYRTEC  ALLERGY ) 10 MG tablet    Sig: Take 1 tablet (10 mg total) by mouth daily.    Dispense:  90 tablet    Refill:  1    Labs/procedures today: CBC, CMP, PC ratio, BPP/dopplers  Treatment Plan:  as outlined above  Reviewed: Preterm labor symptoms and general obstetric precautions including but not limited to vaginal bleeding, contractions, leaking of fluid and fetal movement were reviewed in detail with the patient.  All questions were answered. Pt has home bp cuff. Check bp weekly, let us  know if >160/110  Follow-up: Return for twice weekly as scheduled.   Future Appointments  Date Time Provider Department Center  01/12/2024  2:30 PM [facility] NURSE [facility]  01/15/2024  6:30 AM [facility] SCHED ROOM [facility] None  03/26/2024  1:00 PM Wendel Hals, NP [facility] None    Orders Placed This Encounter  Procedures   Culture, beta strep (group b only)   CBC   Comprehensive metabolic panel with GFR   Protein / creatinine ratio, urine   POC Urinalysis Dipstick OB    Linea Calles, DO Attending Obstetrician & Gynecologist, Faculty Practice Center for [employer], [facility]

## 2024-01-10 LAB — PROTEIN / CREATININE RATIO, URINE
Creatinine, Urine: 117.2 mg/dL
Protein, Ur: 40.5 mg/dL
Protein/Creat Ratio: 346 mg/g{creat} — ABNORMAL HIGH (ref 0–200)

## 2024-01-10 LAB — COMPREHENSIVE METABOLIC PANEL WITH GFR
ALT: 10 IU/L (ref 0–32)
AST: 22 IU/L (ref 0–40)
Albumin: 3.5 g/dL — ABNORMAL LOW (ref 4.0–5.0)
Alkaline Phosphatase: 363 IU/L — ABNORMAL HIGH (ref 44–121)
BUN/Creatinine Ratio: 4 — ABNORMAL LOW (ref 9–23)
BUN: 3 mg/dL — ABNORMAL LOW (ref 6–20)
Bilirubin Total: 0.8 mg/dL (ref 0.0–1.2)
CO2: 18 mmol/L — ABNORMAL LOW (ref 20–29)
Calcium: 8.9 mg/dL (ref 8.7–10.2)
Chloride: 104 mmol/L (ref 96–106)
Creatinine, Ser: 0.67 mg/dL (ref 0.57–1.00)
Globulin, Total: 2.9 g/dL (ref 1.5–4.5)
Glucose: 91 mg/dL (ref 70–99)
Potassium: 4.2 mmol/L (ref 3.5–5.2)
Sodium: 140 mmol/L (ref 134–144)
Total Protein: 6.4 g/dL (ref 6.0–8.5)
eGFR: 126 mL/min/{1.73_m2} (ref >59)

## 2024-01-10 LAB — CBC
Hematocrit: 35.2 % (ref 34.0–46.6)
Hemoglobin: 11.1 g/dL (ref 11.1–15.9)
MCH: 25.7 pg — ABNORMAL LOW (ref 26.6–33.0)
MCHC: 31.5 g/dL (ref 31.5–35.7)
MCV: 82 fL (ref 79–97)
Platelets: 351 10*3/uL (ref 150–450)
RBC: 4.32 x10E6/uL (ref 3.77–5.28)
RDW: 13.9 % (ref 11.7–15.4)
WBC: 8.7 10*3/uL (ref 3.4–10.8)

## 2024-01-10 LAB — CERVICOVAGINAL ANCILLARY ONLY
Chlamydia: NEGATIVE
Comment: NEGATIVE
Comment: NORMAL
Neisseria Gonorrhea: NEGATIVE

## 2024-01-11 ENCOUNTER — Telehealth (HOSPITAL_COMMUNITY): Payer: Self-pay | Admitting: *Deleted

## 2024-01-11 ENCOUNTER — Encounter (HOSPITAL_COMMUNITY): Payer: Self-pay | Admitting: *Deleted

## 2024-01-11 NOTE — Telephone Encounter (Signed)
 Preadmission screen

## 2024-01-12 ENCOUNTER — Ambulatory Visit: Payer: Self-pay | Admitting: Obstetrics & Gynecology

## 2024-01-12 ENCOUNTER — Other Ambulatory Visit

## 2024-01-12 ENCOUNTER — Ambulatory Visit

## 2024-01-12 VITALS — BP 134/90 | HR 80 | Wt 174.0 lb

## 2024-01-12 DIAGNOSIS — O0993 Supervision of high risk pregnancy, unspecified, third trimester: Secondary | ICD-10-CM | POA: Diagnosis not present

## 2024-01-12 DIAGNOSIS — Z3A36 36 weeks gestation of pregnancy: Secondary | ICD-10-CM | POA: Diagnosis not present

## 2024-01-12 DIAGNOSIS — O0990 Supervision of high risk pregnancy, unspecified, unspecified trimester: Secondary | ICD-10-CM

## 2024-01-12 NOTE — Progress Notes (Signed)
   NURSE VISIT- NST  SUBJECTIVE:  Kristen Mejia is a 23 y.o. G1P0 female at [redacted]w[redacted]d, here for a NST for pregnancy complicated by Pre-eclampsia.  She reports active fetal movement, contractions: none, vaginal bleeding: none, membranes: intact.   OBJECTIVE:  BP (!) 134/90   Pulse 80   Wt 174 lb (78.9 kg)   BMI 30.82 kg/m   Appears well, no apparent distress  No results found for this or any previous visit (from the past 24 hours).  NST: FHR baseline 120 bpm, Variability: moderate, Accelerations:present, Decelerations:  Present= Cat 1/reactive Toco: none   ASSESSMENT: G1P0 at [redacted]w[redacted]d with Pre-eclampsia NST reactive  PLAN: EFM strip reviewed by Dr. Ozan   Recommendations: keep next appointment as scheduled    Jolee Critcher E Aveah Castell  01/12/2024 2:56 PM

## 2024-01-13 ENCOUNTER — Encounter (HOSPITAL_COMMUNITY): Payer: Self-pay | Admitting: Obstetrics and Gynecology

## 2024-01-13 ENCOUNTER — Encounter: Payer: Self-pay | Admitting: Obstetrics & Gynecology

## 2024-01-13 ENCOUNTER — Inpatient Hospital Stay (HOSPITAL_COMMUNITY): Admitting: Anesthesiology

## 2024-01-13 ENCOUNTER — Inpatient Hospital Stay (HOSPITAL_COMMUNITY)
Admission: AD | Admit: 2024-01-13 | Discharge: 2024-01-16 | DRG: 807 | Disposition: A | Discharge status: Home / Self Care | Attending: Family Medicine | Admitting: Family Medicine

## 2024-01-13 ENCOUNTER — Inpatient Hospital Stay (HOSPITAL_COMMUNITY)

## 2024-01-13 ENCOUNTER — Other Ambulatory Visit: Payer: Self-pay

## 2024-01-13 DIAGNOSIS — Z833 Family history of diabetes mellitus: Secondary | ICD-10-CM

## 2024-01-13 DIAGNOSIS — O864 Pyrexia of unknown origin following delivery: Secondary | ICD-10-CM | POA: Diagnosis not present

## 2024-01-13 DIAGNOSIS — O368130 Decreased fetal movements, third trimester, not applicable or unspecified: Secondary | ICD-10-CM | POA: Diagnosis not present

## 2024-01-13 DIAGNOSIS — O3403 Maternal care for unspecified congenital malformation of uterus, third trimester: Secondary | ICD-10-CM | POA: Diagnosis present

## 2024-01-13 DIAGNOSIS — O365930 Maternal care for other known or suspected poor fetal growth, third trimester, not applicable or unspecified: Secondary | ICD-10-CM | POA: Diagnosis present

## 2024-01-13 DIAGNOSIS — O99284 Endocrine, nutritional and metabolic diseases complicating childbirth: Secondary | ICD-10-CM | POA: Diagnosis not present

## 2024-01-13 DIAGNOSIS — E0590 Thyrotoxicosis, unspecified without thyrotoxic crisis or storm: Secondary | ICD-10-CM | POA: Diagnosis present

## 2024-01-13 DIAGNOSIS — O283 Abnormal ultrasonic finding on antenatal screening of mother: Secondary | ICD-10-CM | POA: Diagnosis present

## 2024-01-13 DIAGNOSIS — Z3A36 36 weeks gestation of pregnancy: Secondary | ICD-10-CM

## 2024-01-13 DIAGNOSIS — Z8249 Family history of ischemic heart disease and other diseases of the circulatory system: Secondary | ICD-10-CM

## 2024-01-13 DIAGNOSIS — O133 Gestational [pregnancy-induced] hypertension without significant proteinuria, third trimester: Secondary | ICD-10-CM

## 2024-01-13 DIAGNOSIS — Q513 Bicornate uterus: Secondary | ICD-10-CM

## 2024-01-13 DIAGNOSIS — O1404 Mild to moderate pre-eclampsia, complicating childbirth: Secondary | ICD-10-CM | POA: Diagnosis not present

## 2024-01-13 DIAGNOSIS — O99343 Other mental disorders complicating pregnancy, third trimester: Secondary | ICD-10-CM | POA: Diagnosis not present

## 2024-01-13 DIAGNOSIS — E039 Hypothyroidism, unspecified: Secondary | ICD-10-CM | POA: Diagnosis not present

## 2024-01-13 DIAGNOSIS — O139 Gestational [pregnancy-induced] hypertension without significant proteinuria, unspecified trimester: Secondary | ICD-10-CM

## 2024-01-13 DIAGNOSIS — R030 Elevated blood-pressure reading, without diagnosis of hypertension: Secondary | ICD-10-CM | POA: Diagnosis not present

## 2024-01-13 DIAGNOSIS — O365990 Maternal care for other known or suspected poor fetal growth, unspecified trimester, not applicable or unspecified: Secondary | ICD-10-CM

## 2024-01-13 DIAGNOSIS — O368390 Maternal care for abnormalities of the fetal heart rate or rhythm, unspecified trimester, not applicable or unspecified: Principal | ICD-10-CM

## 2024-01-13 LAB — COMPREHENSIVE METABOLIC PANEL WITH GFR
ALT: 11 U/L (ref 0–44)
AST: 23 U/L (ref 15–41)
Albumin: 2.5 g/dL — ABNORMAL LOW (ref 3.5–5.0)
Alkaline Phosphatase: 273 U/L — ABNORMAL HIGH (ref 38–126)
Anion gap: 14 (ref 5–15)
BUN: <5 mg/dL — ABNORMAL LOW (ref 6–20)
CO2: 20 mmol/L — ABNORMAL LOW (ref 22–32)
Calcium: 8.7 mg/dL — ABNORMAL LOW (ref 8.9–10.3)
Chloride: 103 mmol/L (ref 98–111)
Creatinine, Ser: 0.68 mg/dL (ref 0.44–1.00)
GFR, Estimated: >60 mL/min (ref >60)
Glucose, Bld: 98 mg/dL (ref 70–99)
Potassium: 4.2 mmol/L (ref 3.5–5.1)
Sodium: 137 mmol/L (ref 135–145)
Total Bilirubin: 1.5 mg/dL — ABNORMAL HIGH (ref 0.0–1.2)
Total Protein: 6.2 g/dL — ABNORMAL LOW (ref 6.5–8.1)

## 2024-01-13 LAB — CBC
HCT: 32.3 % — ABNORMAL LOW (ref 36.0–46.0)
HCT: 32.7 % — ABNORMAL LOW (ref 36.0–46.0)
Hemoglobin: 10.5 g/dL — ABNORMAL LOW (ref 12.0–15.0)
Hemoglobin: 10.5 g/dL — ABNORMAL LOW (ref 12.0–15.0)
MCH: 25.7 pg — ABNORMAL LOW (ref 26.0–34.0)
MCH: 25.8 pg — ABNORMAL LOW (ref 26.0–34.0)
MCHC: 32.5 g/dL (ref 30.0–36.0)
MCHC: 32.1 g/dL (ref 30.0–36.0)
MCV: 79.2 fL — ABNORMAL LOW (ref 80.0–100.0)
MCV: 80.3 fL (ref 80.0–100.0)
Platelets: 285 10*3/uL (ref 150–400)
Platelets: 268 10*3/uL (ref 150–400)
RBC: 4.08 MIL/uL (ref 3.87–5.11)
RBC: 4.07 MIL/uL (ref 3.87–5.11)
RDW: 13.7 % (ref 11.5–15.5)
RDW: 13.9 % (ref 11.5–15.5)
WBC: 11.1 10*3/uL — ABNORMAL HIGH (ref 4.0–10.5)
WBC: 9.7 10*3/uL (ref 4.0–10.5)
nRBC: 0 % (ref 0.0–0.2)
nRBC: 0.2 % (ref 0.0–0.2)

## 2024-01-13 LAB — PROTEIN / CREATININE RATIO, URINE
Creatinine, Urine: 221 mg/dL
Protein Creatinine Ratio: 0.25 mg/mg{creat} — ABNORMAL HIGH (ref 0.00–0.15)
Total Protein, Urine: 56 mg/dL

## 2024-01-13 LAB — TYPE AND SCREEN
ABO/RH(D): B POS
Antibody Screen: NEGATIVE

## 2024-01-13 LAB — CULTURE, BETA STREP (GROUP B ONLY): Strep Gp B Culture: NEGATIVE

## 2024-01-13 MED ORDER — FENTANYL-BUPIVACAINE-NACL 0.5-0.125-0.9 MG/250ML-% EP SOLN
12.0000 mL/h | EPIDURAL | Status: DC | PRN
  Administered 2024-01-13: 12 mL/h via EPIDURAL
  Filled 2024-01-13: qty 250

## 2024-01-13 MED ORDER — FENTANYL CITRATE (PF) 100 MCG/2ML IJ SOLN
INTRAMUSCULAR | Status: AC
  Filled 2024-01-13: qty 2

## 2024-01-13 MED ORDER — ACETAMINOPHEN-CAFFEINE 500-65 MG PO TABS
2.0000 | ORAL_TABLET | Freq: Once | ORAL | Status: AC
  Administered 2024-01-13: 2 via ORAL
  Filled 2024-01-13: qty 2

## 2024-01-13 MED ORDER — PHENYLEPHRINE 80 MCG/ML (10ML) SYRINGE FOR IV PUSH (FOR BLOOD PRESSURE SUPPORT): 80.0000 ug | PREFILLED_SYRINGE | INTRAVENOUS | Status: DC | PRN

## 2024-01-13 MED ORDER — OXYTOCIN BOLUS FROM INFUSION
333.0000 mL | Freq: Once | INTRAVENOUS | Status: AC
  Administered 2024-01-14: 333 mL via INTRAVENOUS

## 2024-01-13 MED ORDER — FENTANYL CITRATE (PF) 100 MCG/2ML IJ SOLN: 50.0000 ug | INTRAMUSCULAR | Status: DC | PRN

## 2024-01-13 MED ORDER — EPHEDRINE 5 MG/ML INJ
10.0000 mg | INTRAVENOUS | Status: DC | PRN
Start: 2024-01-13 — End: 2024-01-14

## 2024-01-13 MED ORDER — CALCIUM CARBONATE ANTACID 500 MG PO CHEW: 2.0000 | CHEWABLE_TABLET | Freq: Three times a day (TID) | ORAL | Status: DC

## 2024-01-13 MED ORDER — ACETAMINOPHEN 325 MG PO TABS: 650.0000 mg | ORAL_TABLET | ORAL | Status: DC | PRN

## 2024-01-13 MED ORDER — CYCLOBENZAPRINE HCL 5 MG PO TABS
5.0000 mg | ORAL_TABLET | Freq: Once | ORAL | Status: AC
  Administered 2024-01-13: 5 mg via ORAL
  Filled 2024-01-13: qty 1

## 2024-01-13 MED ORDER — CALCIUM CARBONATE ANTACID 500 MG PO CHEW
2.0000 | CHEWABLE_TABLET | Freq: Three times a day (TID) | ORAL | Status: DC
  Administered 2024-01-15 – 2024-01-16 (×2): 400 mg via ORAL
  Filled 2024-01-13 (×2): qty 2

## 2024-01-13 MED ORDER — LACTATED RINGERS IV SOLN
INTRAVENOUS | Status: DC
Start: 2024-01-13 — End: 2024-01-14

## 2024-01-13 MED ORDER — SOD CITRATE-CITRIC ACID 500-334 MG/5ML PO SOLN
30.0000 mL | ORAL | Status: DC | PRN
Start: 2024-01-13 — End: 2024-01-14
  Administered 2024-01-13: 30 mL via ORAL
  Filled 2024-01-13 (×2): qty 30

## 2024-01-13 MED ORDER — LABETALOL HCL 5 MG/ML IV SOLN: 40.0000 mg | INTRAVENOUS | Status: DC | PRN

## 2024-01-13 MED ORDER — LACTATED RINGERS IV SOLN: 500.0000 mL | INTRAVENOUS | Status: DC | PRN

## 2024-01-13 MED ORDER — METHIMAZOLE 2.5 MG HALF TABLET
2.5000 mg | ORAL_TABLET | Freq: Every day | ORAL | Status: DC
  Administered 2024-01-13: 2.5 mg via ORAL
  Filled 2024-01-13: qty 1

## 2024-01-13 MED ORDER — NIFEDIPINE 10 MG PO CAPS: 10.0000 mg | ORAL_CAPSULE | ORAL | Status: DC | PRN

## 2024-01-13 MED ORDER — PANTOPRAZOLE SODIUM 40 MG PO TBEC
40.0000 mg | DELAYED_RELEASE_TABLET | Freq: Every day | ORAL | Status: DC
  Administered 2024-01-13 – 2024-01-16 (×3): 40 mg via ORAL
  Filled 2024-01-13 (×3): qty 1

## 2024-01-13 MED ORDER — EPHEDRINE 5 MG/ML INJ: 10.0000 mg | INTRAVENOUS | Status: DC | PRN

## 2024-01-13 MED ORDER — NIFEDIPINE 10 MG PO CAPS: 20.0000 mg | ORAL_CAPSULE | ORAL | Status: DC | PRN

## 2024-01-13 MED ORDER — LIDOCAINE HCL (PF) 1 % IJ SOLN
30.0000 mL | INTRAMUSCULAR | Status: DC | PRN
Start: 2024-01-13 — End: 2024-01-14

## 2024-01-13 MED ORDER — OXYTOCIN-SODIUM CHLORIDE 30-0.9 UT/500ML-% IV SOLN
1.0000 m[IU]/min | INTRAVENOUS | Status: DC
  Administered 2024-01-13: 1 m[IU]/min via INTRAVENOUS
  Filled 2024-01-13: qty 500

## 2024-01-13 MED ORDER — LACTATED RINGERS IV SOLN: 500.0000 mL | Freq: Once | INTRAVENOUS | Status: DC

## 2024-01-13 MED ORDER — DIPHENHYDRAMINE HCL 50 MG/ML IJ SOLN: 12.5000 mg | INTRAMUSCULAR | Status: DC | PRN

## 2024-01-13 MED ORDER — LACTATED RINGERS IV BOLUS
1000.0000 mL | Freq: Once | INTRAVENOUS | Status: AC
  Administered 2024-01-13: 1000 mL via INTRAVENOUS

## 2024-01-13 MED ORDER — ONDANSETRON 4 MG PO TBDP
8.0000 mg | ORAL_TABLET | Freq: Once | ORAL | Status: AC
  Administered 2024-01-13: 8 mg via ORAL
  Filled 2024-01-13: qty 2

## 2024-01-13 MED ORDER — ONDANSETRON HCL 4 MG/2ML IJ SOLN: 4.0000 mg | Freq: Four times a day (QID) | INTRAMUSCULAR | Status: DC | PRN

## 2024-01-13 MED ORDER — FENTANYL CITRATE (PF) 100 MCG/2ML IJ SOLN
50.0000 ug | INTRAMUSCULAR | Status: DC | PRN
  Administered 2024-01-13: 100 ug via INTRAVENOUS

## 2024-01-13 MED ORDER — OXYTOCIN-SODIUM CHLORIDE 30-0.9 UT/500ML-% IV SOLN: 2.5000 [IU]/h | INTRAVENOUS | Status: DC

## 2024-01-13 MED ORDER — TERBUTALINE SULFATE 1 MG/ML IJ SOLN: 0.2500 mg | Freq: Once | INTRAMUSCULAR | Status: DC | PRN
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
## 2024-01-13 NOTE — MAU Provider Note (Signed)
 OB Note EFM looks better and borderline category I, no accels. Pt would like to try for vaginal delivery which I told her is reasonable F/u apap to see if still has HA BP in room 140s/80s  Tyler Gallant MD Attending Center for [facility] (Faculty Practice) 01/13/2024 Time: [phone]

## 2024-01-13 NOTE — MAU Note (Signed)
 Kristen Mejia is a 23 y.o. at [redacted]w[redacted]d here in MAU reporting: she took BP @ home about an hour apart and BP's were 206/111 and 144/99.  Reports has current HA and epigastric pain, had floaters earlier this morning.  States took Tylenol  2 tabs @ "ten something."  Reports also had N/V earlier today, vomited four times today. Denies VB or LOF.  Endorses +FM, not as mush as usual.  States has eaten today, had five crackers @ 0530 this morning.   LMP: NA Onset of complaint: today Pain score: 4 Vitals:   01/13/24 1326  BP: 117/81  Pulse: (!) 114  Resp: 18  Temp: 98.3 F (36.8 C)  SpO2: 99%     FHT: 161 bpm  Lab orders placed from triage: None

## 2024-01-13 NOTE — Anesthesia Preprocedure Evaluation (Signed)
 Anesthesia Evaluation  Patient identified by MRN, date of birth, ID band Patient awake    Reviewed: Allergy  & Precautions, NPO status , Patient's Chart, lab work & pertinent test results  Airway Mallampati: II  TM Distance: >3 FB Neck ROM: Full    Dental no notable dental hx.    Pulmonary neg pulmonary ROS   Pulmonary exam normal breath sounds clear to auscultation       Cardiovascular hypertension (preE), Normal cardiovascular exam Rhythm:Regular Rate:Normal     Neuro/Psych  PSYCHIATRIC DISORDERS Anxiety     negative neurological ROS     GI/Hepatic negative GI ROS, Neg liver ROS,,,  Endo/Other   Hyperthyroidism   Renal/GU negative Renal ROS  negative genitourinary   Musculoskeletal negative musculoskeletal ROS (+)    Abdominal   Peds  Hematology negative hematology ROS (+)   Anesthesia Other Findings  IOL for preE, BPP 4/10 and FGR  Reproductive/Obstetrics (+) Pregnancy                             Anesthesia Physical Anesthesia Plan  ASA: 3  Anesthesia Plan: Epidural   Post-op Pain Management:    Induction:   PONV Risk Score and Plan: Treatment may vary due to age or medical condition  Airway Management Planned: Natural Airway  Additional Equipment:   Intra-op Plan:   Post-operative Plan:   Informed Consent: I have reviewed the patients History and Physical, chart, labs and discussed the procedure including the risks, benefits and alternatives for the proposed anesthesia with the patient or authorized representative who has indicated his/her understanding and acceptance.       Plan Discussed with: Anesthesiologist  Anesthesia Plan Comments: (Patient identified. Risks, benefits, options discussed with patient including but not limited to bleeding, infection, nerve damage, paralysis, failed block, incomplete pain control, headache, blood pressure changes, nausea,  vomiting, reactions to medication, itching, and post partum back pain. Confirmed with bedside nurse the patient's most recent platelet count. Confirmed with the patient that they are not taking any anticoagulation, have any bleeding history or any family history of bleeding disorders. Patient expressed understanding and wishes to proceed. All questions were answered. )       Anesthesia Quick Evaluation

## 2024-01-14 ENCOUNTER — Encounter (HOSPITAL_COMMUNITY): Payer: Self-pay

## 2024-01-14 ENCOUNTER — Encounter (HOSPITAL_COMMUNITY): Payer: Self-pay | Admitting: Obstetrics and Gynecology

## 2024-01-14 DIAGNOSIS — O99284 Endocrine, nutritional and metabolic diseases complicating childbirth: Secondary | ICD-10-CM

## 2024-01-14 DIAGNOSIS — O365930 Maternal care for other known or suspected poor fetal growth, third trimester, not applicable or unspecified: Secondary | ICD-10-CM

## 2024-01-14 DIAGNOSIS — O1404 Mild to moderate pre-eclampsia, complicating childbirth: Secondary | ICD-10-CM

## 2024-01-14 DIAGNOSIS — Z3A36 36 weeks gestation of pregnancy: Secondary | ICD-10-CM

## 2024-01-14 DIAGNOSIS — O99343 Other mental disorders complicating pregnancy, third trimester: Secondary | ICD-10-CM

## 2024-01-14 LAB — RPR: RPR Ser Ql: NONREACTIVE

## 2024-01-14 LAB — RESPIRATORY PANEL BY PCR

## 2024-01-14 LAB — COMPREHENSIVE METABOLIC PANEL WITH GFR
ALT: 11 U/L (ref 0–44)
ALT: 12 U/L (ref 0–44)
AST: 26 U/L (ref 15–41)
AST: 31 U/L (ref 15–41)
Albumin: 2.4 g/dL — ABNORMAL LOW (ref 3.5–5.0)
Albumin: 1.9 g/dL — ABNORMAL LOW (ref 3.5–5.0)
Alkaline Phosphatase: 256 U/L — ABNORMAL HIGH (ref 38–126)
Alkaline Phosphatase: 204 U/L — ABNORMAL HIGH (ref 38–126)
Anion gap: 12 (ref 5–15)
Anion gap: 7 (ref 5–15)
BUN: <5 mg/dL — ABNORMAL LOW (ref 6–20)
BUN: <5 mg/dL — ABNORMAL LOW (ref 6–20)
CO2: 21 mmol/L — ABNORMAL LOW (ref 22–32)
CO2: 21 mmol/L — ABNORMAL LOW (ref 22–32)
Calcium: 8.7 mg/dL — ABNORMAL LOW (ref 8.9–10.3)
Calcium: 7.9 mg/dL — ABNORMAL LOW (ref 8.9–10.3)
Chloride: 106 mmol/L (ref 98–111)
Chloride: 107 mmol/L (ref 98–111)
Creatinine, Ser: 0.67 mg/dL (ref 0.44–1.00)
Creatinine, Ser: 0.67 mg/dL (ref 0.44–1.00)
GFR, Estimated: >60 mL/min (ref >60)
GFR, Estimated: >60 mL/min (ref >60)
Glucose, Bld: 110 mg/dL — ABNORMAL HIGH (ref 70–99)
Glucose, Bld: 119 mg/dL — ABNORMAL HIGH (ref 70–99)
Potassium: 3.5 mmol/L (ref 3.5–5.1)
Potassium: 3.3 mmol/L — ABNORMAL LOW (ref 3.5–5.1)
Sodium: 139 mmol/L (ref 135–145)
Sodium: 135 mmol/L (ref 135–145)
Total Bilirubin: 1.6 mg/dL — ABNORMAL HIGH (ref 0.0–1.2)
Total Bilirubin: 1.1 mg/dL (ref 0.0–1.2)
Total Protein: 5.8 g/dL — ABNORMAL LOW (ref 6.5–8.1)
Total Protein: 4.7 g/dL — ABNORMAL LOW (ref 6.5–8.1)

## 2024-01-14 LAB — CBC
HCT: 27.1 % — ABNORMAL LOW (ref 36.0–46.0)
Hemoglobin: 8.7 g/dL — ABNORMAL LOW (ref 12.0–15.0)
MCH: 25.5 pg — ABNORMAL LOW (ref 26.0–34.0)
MCHC: 32.1 g/dL (ref 30.0–36.0)
MCV: 79.5 fL — ABNORMAL LOW (ref 80.0–100.0)
Platelets: 225 10*3/uL (ref 150–400)
RBC: 3.41 MIL/uL — ABNORMAL LOW (ref 3.87–5.11)
RDW: 13.9 % (ref 11.5–15.5)
WBC: 9.5 10*3/uL (ref 4.0–10.5)
nRBC: 0.3 % — ABNORMAL HIGH (ref 0.0–0.2)

## 2024-01-14 MED ORDER — PRENATAL MULTIVITAMIN CH
1.0000 | ORAL_TABLET | Freq: Every day | ORAL | Status: DC
  Administered 2024-01-15 – 2024-01-16 (×2): 1 via ORAL
  Filled 2024-01-14 (×2): qty 1

## 2024-01-14 MED ORDER — HYDROXYZINE HCL 25 MG PO TABS: 25.0000 mg | ORAL_TABLET | Freq: Three times a day (TID) | ORAL | Status: DC | PRN

## 2024-01-14 MED ORDER — BENZOCAINE-MENTHOL 20-0.5 % EX AERO
1.0000 | INHALATION_SPRAY | CUTANEOUS | Status: DC | PRN
  Filled 2024-01-14: qty 56

## 2024-01-14 MED ORDER — LIDOCAINE HCL (PF) 1 % IJ SOLN
INTRAMUSCULAR | Status: DC | PRN
  Administered 2024-01-13: 10 mL via EPIDURAL

## 2024-01-14 MED ORDER — BUTALBITAL-APAP-CAFFEINE 50-325-40 MG PO TABS
2.0000 | ORAL_TABLET | Freq: Four times a day (QID) | ORAL | Status: DC | PRN
  Administered 2024-01-14: 2 via ORAL
  Filled 2024-01-14: qty 2

## 2024-01-14 MED ORDER — ACETAMINOPHEN 325 MG PO TABS
650.0000 mg | ORAL_TABLET | ORAL | Status: DC | PRN
  Administered 2024-01-14: 650 mg via ORAL
  Filled 2024-01-14: qty 2

## 2024-01-14 MED ORDER — NIFEDIPINE ER OSMOTIC RELEASE 30 MG PO TB24
30.0000 mg | ORAL_TABLET | Freq: Every day | ORAL | Status: DC
  Administered 2024-01-14: 30 mg via ORAL
  Filled 2024-01-14 (×2): qty 1

## 2024-01-14 MED ORDER — SIMETHICONE 80 MG PO CHEW: 80.0000 mg | CHEWABLE_TABLET | ORAL | Status: DC | PRN

## 2024-01-14 MED ORDER — SODIUM CHLORIDE 0.9% FLUSH
3.0000 mL | Freq: Two times a day (BID) | INTRAVENOUS | Status: DC
  Administered 2024-01-14: 3 mL via INTRAVENOUS

## 2024-01-14 MED ORDER — IBUPROFEN 600 MG PO TABS
600.0000 mg | ORAL_TABLET | Freq: Four times a day (QID) | ORAL | Status: DC
  Administered 2024-01-14 – 2024-01-16 (×8): 600 mg via ORAL
  Filled 2024-01-14 (×10): qty 1

## 2024-01-14 MED ORDER — ONDANSETRON HCL 4 MG PO TABS
4.0000 mg | ORAL_TABLET | ORAL | Status: DC | PRN
  Administered 2024-01-16: 4 mg via ORAL
  Filled 2024-01-14: qty 1

## 2024-01-14 MED ORDER — DIBUCAINE (PERIANAL) 1 % EX OINT: 1.0000 | TOPICAL_OINTMENT | CUTANEOUS | Status: DC | PRN

## 2024-01-14 MED ORDER — ONDANSETRON HCL 4 MG/2ML IJ SOLN: 4.0000 mg | INTRAMUSCULAR | Status: DC | PRN

## 2024-01-14 MED ORDER — ACETAMINOPHEN 500 MG PO TABS
1000.0000 mg | ORAL_TABLET | Freq: Four times a day (QID) | ORAL | Status: DC | PRN
  Administered 2024-01-14 – 2024-01-16 (×3): 1000 mg via ORAL
  Filled 2024-01-14 (×3): qty 2

## 2024-01-14 MED ORDER — COCONUT OIL OIL: 1.0000 | TOPICAL_OIL | Status: DC | PRN

## 2024-01-14 MED ORDER — SODIUM CHLORIDE 0.9% FLUSH: 3.0000 mL | INTRAVENOUS | Status: DC | PRN

## 2024-01-14 MED ORDER — SODIUM CHLORIDE 0.9 % IV SOLN: 250.0000 mL | INTRAVENOUS | Status: DC | PRN

## 2024-01-14 MED ORDER — DIPHENHYDRAMINE HCL 25 MG PO CAPS: 25.0000 mg | ORAL_CAPSULE | Freq: Four times a day (QID) | ORAL | Status: DC | PRN

## 2024-01-14 MED ORDER — SENNOSIDES-DOCUSATE SODIUM 8.6-50 MG PO TABS
2.0000 | ORAL_TABLET | ORAL | Status: DC
  Administered 2024-01-15 – 2024-01-16 (×2): 2 via ORAL
  Filled 2024-01-14 (×2): qty 2

## 2024-01-14 MED ORDER — FUROSEMIDE 20 MG PO TABS
40.0000 mg | ORAL_TABLET | Freq: Every day | ORAL | Status: DC
  Administered 2024-01-14 – 2024-01-16 (×3): 40 mg via ORAL
  Filled 2024-01-14 (×3): qty 2

## 2024-01-14 MED ORDER — POTASSIUM CHLORIDE CRYS ER 20 MEQ PO TBCR
40.0000 meq | EXTENDED_RELEASE_TABLET | Freq: Every day | ORAL | Status: DC
  Administered 2024-01-14 – 2024-01-16 (×3): 40 meq via ORAL
  Filled 2024-01-14 (×3): qty 2

## 2024-01-14 MED ORDER — WITCH HAZEL-GLYCERIN EX PADS: 1.0000 | MEDICATED_PAD | CUTANEOUS | Status: DC | PRN

## 2024-01-14 MED ORDER — ZOLPIDEM TARTRATE 5 MG PO TABS: 5.0000 mg | ORAL_TABLET | Freq: Every evening | ORAL | Status: DC | PRN

## 2024-01-14 NOTE — Anesthesia Procedure Notes (Addendum)
 Epidural Patient location during procedure: OB Start time: 01/13/2024 11:45 PM End time: 01/13/2024 11:55 PM  Staffing Anesthesiologist: Grace Laura, MD Performed: anesthesiologist   Preanesthetic Checklist Completed: patient identified, IV checked, risks and benefits discussed, monitors and equipment checked, pre-op evaluation and timeout performed  Epidural Patient position: sitting Prep: DuraPrep and site prepped and draped Patient monitoring: continuous pulse ox, blood pressure, heart rate and cardiac monitor Approach: midline Location: L3-L4 Injection technique: LOR air  Needle:  Needle type: Tuohy  Needle gauge: 17 G Needle length: 9 cm Needle insertion depth: 5 cm Catheter type: closed end flexible Catheter size: 19 Gauge Catheter at skin depth: 10 cm Test dose: negative  Assessment Sensory level: T8 Events: blood not aspirated, no cerebrospinal fluid, injection not painful, no injection resistance, no paresthesia and negative IV test  Additional Notes Patient identified. Risks/Benefits/Options discussed with patient including but not limited to bleeding, infection, nerve damage, paralysis, failed block, incomplete pain control, headache, blood pressure changes, nausea, vomiting, reactions to medication both or allergic, itching and postpartum back pain. Confirmed with bedside nurse the patient's most recent platelet count. Confirmed with patient that they are not currently taking any anticoagulation, have any bleeding history or any family history of bleeding disorders. Patient expressed understanding and wished to proceed. All questions were answered. Sterile technique was used throughout the entire procedure. Please see nursing notes for vital signs. Test dose was given through epidural catheter and negative prior to continuing to dose epidural or start infusion. Warning signs of high block given to the patient including shortness of breath, tingling/numbness in hands,  complete motor block, or any concerning symptoms with instructions to call for help. Patient was given instructions on fall risk and not to get out of bed. All questions and concerns addressed with instructions to call with any issues or inadequate analgesia.  Reason for block:procedure for pain

## 2024-01-14 NOTE — Progress Notes (Signed)
 LABOR PROGRESS NOTE  Patient Name: Kristen Mejia, female   DOB: 2001-01-02, 23 y.o.  MRN: [id number]  Cat 1 strip.  S/p epidural.  Agreeable to AROM after counseling.  Clear fluid, mom and baby tolerated well.  Infant is asynclitic to maternal right, hx of bicornuate uterus.  Nursing aware.   Ebony Goldstein, MD

## 2024-01-14 NOTE — Plan of Care (Signed)
  Problem: Education: Goal: Knowledge of disease or condition will improve Outcome: Progressing Goal: Knowledge of the prescribed therapeutic regimen will improve Outcome: Progressing   Problem: Fluid Volume: Goal: Peripheral tissue perfusion will improve Outcome: Progressing   Problem: Clinical Measurements: Goal: Complications related to disease process, condition or treatment will be avoided or minimized Outcome: Progressing   Problem: Education: Goal: Knowledge of General Education information will improve Description: Including pain rating scale, medication(s)/side effects and non-pharmacologic comfort measures Outcome: Progressing   Problem: Health Behavior/Discharge Planning: Goal: Ability to manage health-related needs will improve Outcome: Progressing   Problem: Clinical Measurements: Goal: Ability to maintain clinical measurements within normal limits will improve Outcome: Progressing Goal: Will remain free from infection Outcome: Progressing Goal: Diagnostic test results will improve Outcome: Progressing Goal: Respiratory complications will improve Outcome: Progressing Goal: Cardiovascular complication will be avoided Outcome: Progressing   Problem: Activity: Goal: Risk for activity intolerance will decrease Outcome: Progressing   Problem: Nutrition: Goal: Adequate nutrition will be maintained Outcome: Progressing   Problem: Coping: Goal: Level of anxiety will decrease Outcome: Progressing   Problem: Elimination: Goal: Will not experience complications related to bowel motility Outcome: Progressing Goal: Will not experience complications related to urinary retention Outcome: Progressing   Problem: Pain Managment: Goal: General experience of comfort will improve and/or be controlled Outcome: Progressing   Problem: Safety: Goal: Ability to remain free from injury will improve Outcome: Progressing   Problem: Skin Integrity: Goal: Risk for impaired  skin integrity will decrease Outcome: Progressing

## 2024-01-14 NOTE — Anesthesia Postprocedure Evaluation (Signed)
 Anesthesia Post Note  Patient: Kristen Mejia  Procedure(s) Performed: AN AD HOC LABOR EPIDURAL     Patient location during evaluation: Mother Baby Anesthesia Type: Epidural Level of consciousness: awake and alert Pain management: pain level controlled Vital Signs Assessment: post-procedure vital signs reviewed and stable Respiratory status: spontaneous breathing, nonlabored ventilation and respiratory function stable Cardiovascular status: stable Postop Assessment: no headache, no backache and epidural receding Anesthetic complications: no   No notable events documented.  Last Vitals:  Vitals:   01/14/24 0838 01/14/24 1037  BP: (!) 132/95 (!) 126/99  Pulse: (!) 129 (!) 115  Resp: 20 20  Temp: 36.9 C 36.8 C  SpO2: 100% 100%    Last Pain:  Vitals:   01/14/24 1037  TempSrc: Oral  PainSc: 0-No pain   Pain Goal:                   Kristen Mejia

## 2024-01-14 NOTE — Progress Notes (Signed)
 Infant's resp are 88. 02 sat is 92. Heart rate is 125.Color pink. Good tone.

## 2024-01-14 NOTE — Discharge Summary (Signed)
 Postpartum Discharge Summary  Date of Service updated***     Patient Name: Kristen Mejia DOB: Nov 24, 2000 MRN: [id number]  Date of admission: 01/13/2024 Delivery date:01/14/2024 Delivering provider: Pete Merten C Date of discharge: 01/14/2024  Admitting diagnosis: Abnormal antenatal ultrasound [O28.3] Intrauterine pregnancy: [redacted]w[redacted]d     Secondary diagnosis:  Principal Problem:   Abnormal antenatal ultrasound  Additional problems: ***    Discharge diagnosis: Preterm Pregnancy Delivered and Preeclampsia (mild) and hypothyroidism                                          Post partum procedures:{Postpartum procedures:23558} Augmentation: AROM and Pitocin  Complications: None  Hospital course: Induction of Labor With Vaginal Delivery   23 y.o. yo G1P0101 at [redacted]w[redacted]d was admitted to the hospital 01/13/2024 for induction of labor.  Indication for induction: Preeclampsia.  Patient had an labor course was uncomplicated.  Membrane Rupture Time/Date: 1:05 AM,01/14/2024  Delivery Method:Vaginal, Spontaneous Operative Delivery:N/A Episiotomy: None Lacerations:    Details of delivery can be found in separate delivery note.  Patient had a postpartum course complicated by***. Patient is discharged home 01/14/24.  Newborn Data: Birth date:01/14/2024 Birth time:5:00 AM Gender:Female Living status:Living Apgars:7 ,8  Weight:2380 g  Magnesium Sulfate received: {Mag received:30440022} BMZ received: No Rhophylac:N/A MMR:N/A T-DaP:declined Flu: No RSV Vaccine received: No Transfusion:{Transfusion received:30440034}  Immunizations received: Immunization History  Administered Date(s) Administered   DTaP 11/11/2000, 01/11/2001, 03/20/2001, 01/08/2002, 03/03/2006   DTaP / Hep B / IPV 06-Oct-2000, 11/11/2000, 06/20/2001   HIB (PRP-OMP) 11/11/2000, 01/11/2001, 03/20/2001, 01/08/2002   HPV 9-valent 06/20/2017   Hepatitis A, Ped/Adol-2 Dose 06/20/2017   Hepatitis B 01-15-01, 11/11/2000, 06/20/2001    Hepatitis B, PED/ADOLESCENT 10-10-00   IPV 11/11/2000, 01/11/2001, 01/08/2002, 03/03/2006   Influenza,inj,Quad PF,6+ Mos 06/06/2018, 06/24/2020, 06/10/2021   Influenza,inj,Quad PF,6-35 Mos 04/25/2019   Influenza-Unspecified 06/03/2008, 07/09/2008, 08/25/2011   MMR 09/11/2001, 03/03/2006   Meningococcal Conjugate 05/05/2016   Meningococcal Mcv4o 06/20/2017   PPD Test 09/09/2023   Pneumococcal Conjugate-13 11/11/2000, 01/11/2001, 03/20/2001   Td 03/28/2012   Td (Adult), 2 Lf Tetanus Toxid, Preservative Free 03/28/2012   Tdap 03/28/2012   Varicella 09/11/2001    Physical exam  Vitals:   01/14/24 0231 01/14/24 0300 01/14/24 0330 01/14/24 0401  BP: 123/81 121/81 112/78 117/76  Pulse: 99 91 91 91  Resp:      Temp:   98.1 F (36.7 C)   TempSrc:      SpO2:    100%  Weight:      Height:       General: {Exam; general:21111117} Lochia: {Desc; appropriate/inappropriate:30686::"appropriate"} Uterine Fundus: {Desc; firm/soft:30687} Incision: {Exam; incision:21111123} DVT Evaluation: {Exam; dvt:2111122} Labs: Lab Results  Component Value Date   WBC 9.7 01/13/2024   HGB 10.5 (L) 01/13/2024   HCT 32.7 (L) 01/13/2024   MCV 80.3 01/13/2024   PLT 268 01/13/2024      Latest Ref Rng & Units 01/13/2024   10:45 PM  CMP  Glucose 70 - 99 mg/dL 130   BUN 6 - 20 mg/dL <5   Creatinine 8.65 - 1.00 mg/dL 7.84   Sodium 696 - 295 mmol/L 139   Potassium 3.5 - 5.1 mmol/L 3.5   Chloride 98 - 111 mmol/L 106   CO2 22 - 32 mmol/L 21   Calcium  8.9 - 10.3 mg/dL 8.7   Total Protein 6.5 - 8.1 g/dL 5.8   Total  Bilirubin 0.0 - 1.2 mg/dL 1.6   Alkaline Phos 38 - 126 U/L 256   AST 15 - 41 U/L 26   ALT 0 - 44 U/L 11    Edinburgh Score:     No data to display         No data recorded  After visit meds:  Allergies as of 01/14/2024   No Known Allergies   Med Rec must be completed prior to using this [facility]***        Discharge home in stable condition Infant Feeding: {Baby  feeding:23562} Infant Disposition:{CHL IP OB HOME WITH OZHYQM:57846} Discharge instruction: per After Visit Summary and Postpartum booklet. Activity: Advance as tolerated. Pelvic rest for 6 weeks.  Diet: {OB NGEX:52841324} Future Appointments: Future Appointments  Date Time Provider Department Center  03/26/2024  1:00 PM Wendel Hals, NP [facility] None   Follow up Visit: Message sent FT on 6/7  Please schedule this patient for a In person postpartum visit in 4 weeks with the following provider: Any provider. Additional Postpartum F/U:BP check 1 week  High risk pregnancy complicated by: HTN Delivery mode:  Vaginal, Spontaneous Anticipated Birth Control:  Unsure   01/14/2024 Ebony Goldstein, MD

## 2024-01-15 ENCOUNTER — Inpatient Hospital Stay (HOSPITAL_COMMUNITY): Admission: AD | Admit: 2024-01-15 | Source: Home / Self Care

## 2024-01-15 ENCOUNTER — Inpatient Hospital Stay (HOSPITAL_COMMUNITY)

## 2024-01-15 LAB — CBC
HCT: 21.5 % — ABNORMAL LOW (ref 36.0–46.0)
Hemoglobin: 7 g/dL — ABNORMAL LOW (ref 12.0–15.0)
MCH: 26.1 pg (ref 26.0–34.0)
MCHC: 32.6 g/dL (ref 30.0–36.0)
MCV: 80.2 fL (ref 80.0–100.0)
Platelets: 223 10*3/uL (ref 150–400)
RBC: 2.68 MIL/uL — ABNORMAL LOW (ref 3.87–5.11)
RDW: 14.1 % (ref 11.5–15.5)
WBC: 8.9 10*3/uL (ref 4.0–10.5)
nRBC: 0 % (ref 0.0–0.2)

## 2024-01-15 NOTE — Progress Notes (Signed)
 CSW received consult for hx of Anxiety. CSW met with MOB to offer support and complete assessment. When CSW entered room, MOB was observed sitting in hospital bed. FOB was present laying on couch. Infant admitted to NICU due to hypoglycemia and poor feeding. CSW introduced self. MOB provided verbal consent to speak in front of FOB about anything. CSW explained reason for consult. MOB presented as calm and remained engaged throughout consult.  CSW inquired about "Ruther Cower" and how MOB is feeling emotionally considering infant's NICU admission. MOB reports that she recently visited with infant in the NICU and he is working on his feeding and blood sugars. MOB shared that she was more concerned yesterday and feels reassured that he is now taking a pacifier. MOB reports she has been "okay." CSW provided active listening, acknowledged, and normalized MOB's feelings. MOB reports she feels updated about infant's care.   CSW inquired about MOB's mental health history. MOB acknowledged a history of anxiety, stating that she was diagnosed about 2-3 years ago. CSW inquired about symptoms during pregnancy and current treatment. MOB reported minimal anxiety symptoms while pregnant and states she does not currently receive treatment for anxiety. MOB declined outpatient mental health resources at this time. MOB identified FOB, her best friend, and her dad as supports. MOB denied current SI/HI.   CSW provided education regarding the baby blues period vs. perinatal mood disorders, discussed treatment and gave resources for mental health follow up if concerns arise.  CSW recommends self-evaluation during the postpartum time period using the New Mom Checklist from Postpartum Progress and encouraged MOB to contact a medical professional if symptoms are noted at any time.    MOB reports she has all needed items for infant, including a car seat and bassinet. MOB states she is undecided on a pediatrician for infant and requested a  pediatrician list, which CSW provided.   CSW provided review of Sudden Infant Death Syndrome (SIDS) precautions.    CSW explained that a Child psychotherapist is available if MOB is in need of additional support/resources while infant is admitted to NICU. MOB expressed appreciation.  CSW identifies no further need for intervention and no barriers to discharge at this time.  Signed,  Elizabeth Gulling, MSW, LCSWA, LCASA 01/15/2024 4:54 PM

## 2024-01-15 NOTE — Progress Notes (Addendum)
 POSTPARTUM PROGRESS NOTE  Post Partum Day 1  Subjective:  Kristen Mejia is a 23 y.o. G1P0101 s/p SVD at [redacted]w[redacted]d.  She reports she is doing well. No acute events overnight. She denies any problems with ambulating, voiding or po intake. Denies nausea or vomiting.  Pain is well controlled.  Lochia is appropriate.  Objective: Blood pressure 106/73, pulse (!) 55, temperature (!) 97.4 F (36.3 C), temperature source Oral, resp. rate 16, height 5\' 3"  (1.6 m), weight 78 kg, SpO2 99%, unknown if currently breastfeeding.  Physical Exam:  General: alert, cooperative and no distress Chest: no respiratory distress Heart:regular rate, distal pulses intact Uterine Fundus: firm, appropriately tender DVT Evaluation: No calf swelling or tenderness Extremities: trace edema Skin: warm, dry  Recent Labs    01/14/24 0659 01/15/24 0453  HGB 8.7* 7.0*  HCT 27.1* 21.5*    Assessment/Plan: Kristen Mejia is a 23 y.o. G1P0101 s/p SVD at [redacted]w[redacted]d   PPD#1 - Doing well  Routine postpartum care Mild PreE - soft Bps so holding Procardia , continue Lasix +K Hyperthyroid - continue home Methimazole  PP Fever - afebrile, no leukocytosis, RVP neg, CTM Contraception: Depo Feeding: breast Dispo: Plan for discharge 6/9 if meeting all goals.   LOS: 2 days   Ebony Goldstein, MD OB Fellow  01/15/2024, 8:46 AM

## 2024-01-16 ENCOUNTER — Other Ambulatory Visit: Admitting: Radiology

## 2024-01-16 ENCOUNTER — Encounter: Admitting: Obstetrics & Gynecology

## 2024-01-16 MED ORDER — POTASSIUM CHLORIDE CRYS ER 20 MEQ PO TBCR: 40.0000 meq | EXTENDED_RELEASE_TABLET | Freq: Every day | ORAL | 0 refills | Status: DC

## 2024-01-16 MED ORDER — SENNOSIDES-DOCUSATE SODIUM 8.6-50 MG PO TABS: 2.0000 | ORAL_TABLET | ORAL | 0 refills | Status: DC

## 2024-01-16 MED ORDER — ACETAMINOPHEN 500 MG PO TABS: 1000.0000 mg | ORAL_TABLET | Freq: Four times a day (QID) | ORAL | 0 refills | Status: DC | PRN

## 2024-01-16 MED ORDER — IBUPROFEN 600 MG PO TABS
600.0000 mg | ORAL_TABLET | Freq: Four times a day (QID) | ORAL | 0 refills | Status: DC
Start: 2024-01-16 — End: 2024-03-23

## 2024-01-16 MED ORDER — FUROSEMIDE 40 MG PO TABS: 40.0000 mg | ORAL_TABLET | Freq: Every day | ORAL | 0 refills | Status: DC

## 2024-01-16 NOTE — Lactation Note (Signed)
 This note was copied from a baby's chart.  NICU Lactation Consultation Note  Patient Name: Kristen Mejia [id number] Date: 01/16/2024 Age:23 hours  Reason for consult: Initial assessment; Primapara; 1st time breastfeeding; NICU baby; Early term 37-38.6wks; Infant < 6lbs; Maternal endocrine disorder; Other (Comment) (IUGR) Type of Endocrine Disorder?: Thyroid  (hyperthyroid (methimazole ))  SUBJECTIVE Visited with family of 42 79/2 weeks old AGA NICU female; [name] got admitted due to hypoglycemia and poor feedings. Kristen Mejia is a P1 and originally had planned to formula feed only but after second NICU admission she changed her mind and decided to give pumping a try; she already has a pump for home use. She is expecting to be discharge from the [facility] today. Reviewed discharge education, pump settings, pumping schedule, lactogenesis II, sanitizing with Dr. Bevin Mejia bag and anticipatory guidelines. She voiced she's not ready to start pumping during [facility] consult because she wanted to bond/hold baby, she's planning on rooming in after her discharge and will initiate pumping then.   OBJECTIVE Infant data: Mother's Current Feeding Choice: Breast Milk and Formula  O2 Device: Room Air  Infant feeding assessment IDFTS - Readiness: 2 IDFTS - Quality: 2   Maternal data: G1P0101 Vaginal, Spontaneous Significant Breast History:: moderate breast changes during the pregnancy Current breast feeding challenges:: NICU admission Does the patient have breastfeeding experience prior to this delivery?: No Pumping frequency: set up DEBP at 54 hours post-partum Flange Size: 21 Hands-free pumping top sizes: -- (Has her own pumping bra) Risk factor for low/delayed milk supply:: primipara, prematurity, IUGR, < 6 lbs, infant separation  Pump: Personal, Hands Free ([employer]ow wearable pump at home)  ASSESSMENT Infant: Feeding Status: Scheduled 9-12-3-6 Feeding method: Bottle; Tube/Gavage (Bolus) Nipple Type: Dr.  Leticia Mejia Preemie  Maternal: Breast are soft and tissue is compressible  INTERVENTIONS/PLAN Interventions: Interventions: Breast feeding basics reviewed; DEBP; Education; [employer] brochure; CDC Guidelines for Breast Pump Cleaning; NICU Pumping Log Discharge Education: Engorgement and breast care Tools: Pump; Flanges; Hands-free pumping top Pump Education: Setup, frequency, and cleaning; Milk Storage  Plan: STS around care times Pump every 3 hours for 15 minutes, ideally 8 pumping sessions/24 hours Switch to maintain mode once expressing +20 ml of EBM combined  Female visitor present. All questions and concerns answered, family to contact [facility] services PRN.  Consult Status: NICU follow-up NICU Follow-up type: Verify absence of engorgement; Verify onset of copious milk   Kristen Mejia S Kristen Mejia 01/16/2024, 12:00 PM

## 2024-01-17 ENCOUNTER — Ambulatory Visit (HOSPITAL_COMMUNITY): Payer: Self-pay

## 2024-01-17 NOTE — Lactation Note (Signed)
 This note was copied from a baby's chart. Lactation Consultation Note  Patient Name: Boy Lagina Reader [id number] Date: 01/17/2024 Age:23 days   LC spoke with P1 Mom on the phone.  Mom went home yesterday after her discharge and hasn't pumped.  She is now engorged without any pumping yet and wants LC assistance.  Mom encouraged to remain rooming-in to utilize the [facility] pump.   RN asked to call [facility] when Mom arrives.  Dario Edison 01/17/2024, 2:11 PM

## 2024-01-17 NOTE — Lactation Note (Signed)
 This note was copied from a baby's chart.  NICU Lactation Consultation Note  Patient Name: Kristen Mejia [id number] Date: 01/17/2024 Age:23 years  Reason for consult: Follow-up assessment; NICU baby; Primapara; 1st time breastfeeding; Early term 37-38.6wks; Infant < 6lbs; Other (Comment); Maternal endocrine disorder Type of Endocrine Disorder?: Thyroid  (hyperthyroidism, GHTN)  SUBJECTIVE  LC called that Mom had arrived. Breasts full, but not fully engorged. LC provided Mom with a small pumping top and assisted her to pump using 18 mm flanges.  Mom expressed 15 ml on initiation setting.  Mom educated to use the maintain mode when expressing 20 ml or more when pumping.  Mom plans to sign up for [facility] and was wondering how long she would have to wait for appt.  LC shared she wasn't sure.  Mom informed of pump rentals in hospital gift shop as [employer] may help.  LC asked if this wearable pump was something she obtained through her insurance and she said no.  LC submitted a STORK pump request.  Mom plans to stay tonight and use the [employer] pump and pump at each of baby's feedings, or every 3 hrs.  LC encouraged STS with baby.  Mom asked about offering the breast if baby is showing interest.  LC made an appointment for tomorrow at 3pm to possibly do a feeding assist.  OBJECTIVE Infant data: Mother's Current Feeding Choice: Breast Milk and Formula  O2 Device: Room Air  Infant feeding assessment IDFTS - Readiness: 2 IDFTS - Quality: 3 (Seemed to look stressed when feeding even with strict pacing. Felt like he was gulping even though he had very little formula in his mouth.)   Maternal data: G1P0101 Vaginal, Spontaneous Significant Breast History:: ++ breast changes Current breast feeding challenges:: delayed initiation of pumping to 82 hrs post delivery. Does the patient have breastfeeding experience prior to this delivery?: No Pumping frequency: Initiated pumping at 82 hrs post birth,  Mom encouraged to pump every 3 hrs and do a lot of STS Pumped volume: 15 mL Flange Size: 18 Hands-free pumping top sizes: Small/Medium (Blue) Risk factor for low/delayed milk supply:: infant separation/delayed breast stimulation  Pump: Referral sent for Stork Pump  ASSESSMENT Infant:  Feeding Status: Scheduled 9-12-3-6 Feeding method: Bottle; Tube/Gavage (Bolus) Nipple Type: Dr. [name] Preemie  Maternal: Milk volume: Normal  INTERVENTIONS/PLAN Interventions: Interventions: Breast feeding basics reviewed; Skin to skin; Breast massage; Hand express; DEBP; Hand pump; Education Discharge Education: Engorgement and breast care Tools: Pump; Flanges; Hands-free pumping top; Bottle Pump Education: Setup, frequency, and cleaning; Milk Storage  Plan: 1- STS with baby during feeding times 2- Pump both breasts 15-20 min every 3 hrs 3- LC to consult 6/11 at 3 pm.  Consult Status: NICU follow-up NICU Follow-up type: Verify DEBP issuance; Verify absence of engorgement; Verify onset of copious milk   Kristen Mejia 01/17/2024, 4:42 PM

## 2024-01-18 ENCOUNTER — Encounter: Payer: Self-pay | Admitting: Obstetrics & Gynecology

## 2024-01-18 ENCOUNTER — Ambulatory Visit (HOSPITAL_COMMUNITY): Payer: Self-pay

## 2024-01-18 NOTE — Lactation Note (Signed)
 This note was copied from a baby's chart.  NICU Lactation Consultation Note  Patient Name: Kristen Mejia [id number] Date: 01/18/2024 Age:23 days  Reason for consult: Follow-up assessment; NICU baby; Primapara; 1st time breastfeeding; Early term 37-38.6wks; Infant < 6lbs; Mother's request; Maternal endocrine disorder Type of Endocrine Disorder?: Thyroid  (Hyperthyroid, Grave's Disease on Methimazole  Rx L2)  SUBJECTIVE  LC in to visit with P1 Mom of baby Kristen Mejia in the NICU.  Baby took full volumes PO the last 3 feedings.  LC offered to assist with positioning and latching baby to the breast.  Mom had been gone all day without pumping and wanted to pump and maybe try at a later time.    Her STORK pump (Spectra ) was delivered after she left this am.  She doesn't like her [facility] Wearable pump, so she is aware to order flange inserts for her Spectra  pump.    LC reviewed how to disassemble, wash, rinse and place in drying bin on clean paper towels.  Mom understands about the sanitizing bag and the need to let baby's RN know when parts are washed and clean and ready.  LC provided Mom with another pumping top.  Mom will use the maintain mode now that she expresses more than 20 ml.  Breasts are less full feeling today than yesterday.    LC talked about the need to remove milk to make milk.  Mom states she is staying in baby's room again tonight and will pump every 2-3 hrs to help stimulate her supply.  OBJECTIVE Infant data: Mother's Current Feeding Choice: Breast Milk and Formula  O2 Device: Room Air  Infant feeding assessment IDFTS - Readiness: 2 IDFTS - Quality: 2   Maternal data: G1P0101 Vaginal, Spontaneous Significant Breast History:: ++ breast changes Current breast feeding challenges:: delayed initiation of pumping to 82 hrs post delivery. Pumping frequency: Not consistent, last pumped this morning before leaving NICU.  Mom encouraged to pump 8 times per 24 hrs Pumped volume: 20  mL Flange Size: 18 Hands-free pumping top sizes: Small/Medium (Blue) Risk factor for low/delayed milk supply:: infant separation/delayed breast stimulation  Pump: Received Stork Pump (Spectra )   Feeding Status: Scheduled 9-12-3-6 Feeding method: Bottle Nipple Type: Dr. [name] Preemie  Maternal: Milk volume: Normal  INTERVENTIONS/PLAN Interventions: Interventions: Breast feeding basics reviewed; Skin to skin; Breast massage; Hand express; DEBP; Education Discharge Education: Engorgement and breast care Tools: Pump; Flanges; Hands-free pumping top; Bottle Pump Education: Setup, frequency, and cleaning; Milk Storage  Plan: Consult Status: NICU follow-up NICU Follow-up type: Verify absence of engorgement; Verify onset of copious milk   Dario Edison 01/18/2024, 5:31 PM

## 2024-01-19 ENCOUNTER — Other Ambulatory Visit

## 2024-01-20 NOTE — Lactation Note (Signed)
 This note was copied from a baby's chart.  NICU Lactation Consultation Note  Patient Name: Kristen Mejia [id number] Date: 01/20/2024 Age:23 days  Reason for consult: Follow-up assessment; Primapara; 1st time breastfeeding; NICU baby; Infant < 6lbs; Early term 37-38.6wks; Maternal endocrine disorder Type of Endocrine Disorder?: Thyroid  (hyperthyroid (methimazole ))  SUBJECTIVE Visited with family of 28 20/43 weeks old AGA NICU female Ruther Cower; [name] reported she's no longer engorged since she started pumping, although noticed that pumping is still no consistent. [name] is taking baby Isaiah home today. Reviewed discharge education and the importance of consistent pumping even after feedings/attempts at the breast for the prevention of engorgement and to protect her supply. She said that she's only used the pump in baby's room a few times because she hasn't opened her Spectra  Stork 2 pump. She inquired about getting inserts for her home pump, provided some recommendations. Referral to [facility] sent for [facility] OP F/U. No other support person at this time. All questions and concerns answered, family to contact [facility] services PRN.  OBJECTIVE Infant data: Mother's Current Feeding Choice: Breast Milk and Formula  O2 Device: Room Air  Infant feeding assessment IDFTS - Readiness: 2 IDFTS - Quality: 4   Maternal data: G1P0101 Vaginal, Spontaneous Pumping frequency: 3 times/24 hours; not consistent Pumped volume: 30 mL  Pump: Received Stork Pump (Spectra )  ASSESSMENT Infant: Feeding Status: Ad lib Feeding method: Bottle Nipple Type: Dr. [name] Preemie  Maternal: Milk volume: Low  INTERVENTIONS/PLAN Interventions: Interventions: Breast feeding basics reviewed; DEBP; Education; Coconut oil Discharge Education: Outpatient recommendation; Outpatient Epic message sent  Plan: Consult Status: Complete   [name] Bare 01/20/2024, 4:21 PM

## 2024-01-23 ENCOUNTER — Other Ambulatory Visit: Admitting: Radiology

## 2024-01-23 ENCOUNTER — Encounter: Admitting: Obstetrics & Gynecology

## 2024-01-23 ENCOUNTER — Other Ambulatory Visit: Payer: Self-pay | Admitting: Obstetrics & Gynecology

## 2024-01-23 ENCOUNTER — Telehealth: Admitting: *Deleted

## 2024-01-23 VITALS — BP 131/102 | HR 98

## 2024-01-23 DIAGNOSIS — Z8759 Personal history of other complications of pregnancy, childbirth and the puerperium: Secondary | ICD-10-CM

## 2024-01-23 DIAGNOSIS — O1493 Unspecified pre-eclampsia, third trimester: Secondary | ICD-10-CM

## 2024-01-23 MED ORDER — NIFEDIPINE ER OSMOTIC RELEASE 30 MG PO TB24: 30.0000 mg | ORAL_TABLET | Freq: Every day | ORAL | 0 refills | Status: DC

## 2024-01-23 NOTE — Progress Notes (Signed)
   NURSE VISIT- BLOOD PRESSURE CHECK  I connected with Beaulah Bouquet on 01/23/2024 by MyChart video and verified that I am speaking with the correct person using two identifiers.   I discussed the limitations of evaluation and management by telemedicine. The patient expressed understanding and agreed to proceed.  Nurse is at the office, and patient is at home.  SUBJECTIVE:  Kristen Mejia is a 23 y.o. G57P0101 female here for BP check. She is postpartum, delivery date 01/14/24    HYPERTENSION ROS:  Postpartum:  Severe headaches that don't go away with tylenol /other medicines: no Visual changes (seeing spots/double/blurred vision) No  Severe pain under right breast breast or in center of upper chest No  Severe nausea/vomiting No  Taking medicines as instructed not applicable    OBJECTIVE:  BP (!) 131/102 (BP Location: Right Arm, Patient Position: Sitting, Cuff Size: Normal)   Pulse 98   Breastfeeding Yes   Appearance alert, well appearing, and in no distress.  ASSESSMENT: Postpartum  blood pressure check  PLAN: Discussed with Dr. Ozan   Recommendations: new prescription will be sent -start Procardia  30mg  daily Follow-up: 1 week virtual bp check   Laverne Potter  01/23/2024 12:20 PM

## 2024-01-23 NOTE — Progress Notes (Signed)
 Rx for procardia  daily  Roisin Mones, DO Attending Obstetrician & Gynecologist, [facility] for [employer], [facility]

## 2024-01-26 ENCOUNTER — Other Ambulatory Visit

## 2024-01-30 ENCOUNTER — Encounter: Admitting: Advanced Practice Midwife

## 2024-01-30 ENCOUNTER — Other Ambulatory Visit

## 2024-01-31 ENCOUNTER — Telehealth

## 2024-01-31 VITALS — BP 115/86

## 2024-01-31 DIAGNOSIS — Z8759 Personal history of other complications of pregnancy, childbirth and the puerperium: Secondary | ICD-10-CM

## 2024-01-31 DIAGNOSIS — Z0130 Encounter for examination of blood pressure without abnormal findings: Secondary | ICD-10-CM

## 2024-01-31 NOTE — Progress Notes (Signed)
   NURSE VISIT- BLOOD PRESSURE CHECK  I connected with Morna Rung on 01/31/2024 by telephone  and verified that I am speaking with the correct person using two identifiers.   I discussed the limitations of evaluation and management by telemedicine. The patient expressed understanding and agreed to proceed.  Nurse is at the office, and patient is at home.  SUBJECTIVE:  Kristen Mejia is a 23 y.o. [id number] female here for BP check. She is postpartum, delivery date 01/14/2024    HYPERTENSION ROS:  Postpartum Severe headaches that don't go away with tylenol /other medicines: No  Visual changes (seeing spots/double/blurred vision) No  Severe pain under right breast breast or in center of upper chest No  Severe nausea/vomiting No  Taking medicines as instructed yes  OBJECTIVE:  BP 115/86   Appearance alert, well appearing, and in no distress.  ASSESSMENT: Postpartum  blood pressure check  PLAN: Discussed with Dr. Ozan   Recommendations: stop medicine 2 days before next visit   Follow-up: as scheduled   Zanayah Shadowens E Katira Dumais  01/31/2024 1:54 PM

## 2024-02-02 ENCOUNTER — Other Ambulatory Visit

## 2024-02-08 ENCOUNTER — Ambulatory Visit: Payer: Self-pay

## 2024-02-08 NOTE — Telephone Encounter (Signed)
 Patient scheduled to see Bari Learn 07/03

## 2024-02-08 NOTE — Telephone Encounter (Signed)
 FYI Only or Action Required?: FYI only for provider.  Patient was last seen in primary care on 01/04/2024 by Cathlene Marry Lenis, FNP. Called Nurse Triage reporting No chief complaint on file.. Symptoms began several days ago. Interventions attempted: Nothing. Symptoms are: gradually worsening.  Triage Disposition: See Physician Within 24 Hours  Patient/caregiver understands and will follow disposition?: Yes  Copied from CRM [id number]. Topic: Clinical - Red Word Triage >> Feb 08, 2024 12:41 PM DeAngela L wrote: Red Word that prompted transfer to Nurse Triage: possible UTI the patient is using the restroom more often and this started Monday and has increased daily  Pt num [phone] (M) Reason for Disposition  Urinating more frequently than usual (i.e., frequency)  Answer Assessment - Initial Assessment Questions 1. SYMPTOM: What's the main symptom you're concerned about? (e.g., frequency, incontinence)     Frequency, Concentrated Odor  2. ONSET: When did the  symptoms  start?     Monday  3. PAIN: Is there any pain? If Yes, ask: How bad is it? (Scale: 1-10; mild, moderate, severe)     No  4. CAUSE: What do you think is causing the symptoms?     UTI  5. OTHER SYMPTOMS: Do you have any other symptoms? (e.g., blood in urine, fever, flank pain, pain with urination)     NO  6. PREGNANCY: Is there any chance you are pregnant? When was your last menstrual period?     Recently Pregnant 01-14-24  Protocols used: Urinary Symptoms-A-AH

## 2024-02-09 ENCOUNTER — Ambulatory Visit: Payer: Self-pay | Admitting: Family

## 2024-02-09 ENCOUNTER — Ambulatory Visit (INDEPENDENT_AMBULATORY_CARE_PROVIDER_SITE_OTHER): Admitting: Family

## 2024-02-09 VITALS — BP 130/85 | HR 87 | Temp 97.7°F | Ht 63.0 in | Wt 158.4 lb

## 2024-02-09 DIAGNOSIS — B9689 Other specified bacterial agents as the cause of diseases classified elsewhere: Secondary | ICD-10-CM | POA: Diagnosis not present

## 2024-02-09 DIAGNOSIS — N898 Other specified noninflammatory disorders of vagina: Secondary | ICD-10-CM

## 2024-02-09 DIAGNOSIS — B3731 Acute candidiasis of vulva and vagina: Secondary | ICD-10-CM

## 2024-02-09 DIAGNOSIS — N760 Acute vaginitis: Secondary | ICD-10-CM | POA: Diagnosis not present

## 2024-02-09 DIAGNOSIS — R300 Dysuria: Secondary | ICD-10-CM

## 2024-02-09 LAB — MICROSCOPIC EXAMINATION
Renal Epithel, UA: NONE SEEN /HPF
Yeast, UA: NONE SEEN

## 2024-02-09 LAB — URINALYSIS, COMPLETE
Bilirubin, UA: NEGATIVE
Glucose, UA: NEGATIVE
Ketones, UA: NEGATIVE
Nitrite, UA: NEGATIVE
Specific Gravity, UA: 1.02 (ref 1.005–1.030)
Urobilinogen, Ur: 0.2 mg/dL (ref 0.2–1.0)
pH, UA: 7 (ref 5.0–7.5)

## 2024-02-09 LAB — WET PREP FOR TRICH, YEAST, CLUE
Clue Cell Exam: POSITIVE — AB
Trichomonas Exam: NEGATIVE
Yeast Exam: POSITIVE — AB

## 2024-02-09 MED ORDER — FLUCONAZOLE 150 MG PO TABS: 150.0000 mg | ORAL_TABLET | ORAL | 0 refills | Status: DC | PRN

## 2024-02-09 MED ORDER — METRONIDAZOLE 500 MG PO TABS: 500.0000 mg | ORAL_TABLET | Freq: Two times a day (BID) | ORAL | 0 refills | Status: AC

## 2024-02-09 NOTE — Progress Notes (Signed)
 Subjective:    Patient ID: Kristen Mejia, female    DOB: 09-29-2000, 23 y.o.   MRN: [id number]  No chief complaint on file.  PT presents to the office today with vaginal odor that started 4 days ago.  Vaginal Discharge The patient's primary symptoms include a genital odor and vaginal discharge. The patient's pertinent negatives include no genital itching. This is a new problem. The current episode started in the past 7 days. The problem has been gradually worsening. The patient is experiencing no pain. Associated symptoms include frequency. Pertinent negatives include no chills, constipation, diarrhea, flank pain, hematuria, painful intercourse, sore throat, urgency or vomiting. The vaginal discharge was white. She has tried nothing for the symptoms. The treatment provided no relief.      Review of Systems  Constitutional:  Negative for chills.  HENT:  Negative for sore throat.   Gastrointestinal:  Negative for constipation, diarrhea and vomiting.  Genitourinary:  Positive for frequency and vaginal discharge. Negative for flank pain, hematuria and urgency.  All other systems reviewed and are negative.   Social History   Socioeconomic History   Marital status: Single    Spouse name: Not on file   Number of children: Not on file   Years of education: Not on file   Highest education level: Some college, no degree  Occupational History   Not on file  Tobacco Use   Smoking status: Never    Passive exposure: Yes   Smokeless tobacco: Never  Vaping Use   Vaping status: Former   Substances: Nicotine, Flavoring  Substance and Sexual Activity   Alcohol use: No    Comment: occas   Drug use: No   Sexual activity: Yes    Birth control/protection: None  Other Topics Concern   Not on file  Social History Narrative   Not on file   Social Drivers of Health   Financial Resource Strain: Low Risk  (02/09/2024)   Overall Financial Resource Strain (CARDIA)    Difficulty of Paying Living  Expenses: Not very hard  Food Insecurity: No Food Insecurity (02/09/2024)   Hunger Vital Sign    Worried About Running Out of Food in the Last Year: Never true    Ran Out of Food in the Last Year: Never true  Transportation Needs: No Transportation Needs (02/09/2024)   PRAPARE - Administrator, Civil Service (Medical): No    Lack of Transportation (Non-Medical): No  Physical Activity: Insufficiently Active (02/09/2024)   Exercise Vital Sign    Days of Exercise per Week: 2 days    Minutes of Exercise per Session: 30 min  Stress: Stress Concern Present (02/09/2024)   Kristen Mejia of Occupational Health - Occupational Stress Questionnaire    Feeling of Stress: To some extent  Social Connections: Moderately Integrated (02/09/2024)   Social Connection and Isolation Panel    Frequency of Communication with Friends and Family: More than three times a week    Frequency of Social Gatherings with Friends and Family: Twice a week    Attends Religious Services: More than 4 times per year    Active Member of [employer] or Organizations: Yes    Attends Banker Meetings: Never    Marital Status: Never married   Family History  Problem Relation Age of Onset   Hypertension Mother    Hypertension Father    Hypothyroidism Maternal Aunt    Hypothyroidism Maternal Uncle    Hypothyroidism Maternal Grandmother    Hypothyroidism Maternal  Grandfather    Diabetes Paternal Grandmother         Objective:   Physical Exam Vitals reviewed.  Constitutional:      General: She is not in acute distress.    Appearance: She is well-developed.  HENT:     Head: Normocephalic and atraumatic.  Eyes:     Pupils: Pupils are equal, round, and reactive to light.  Neck:     Thyroid : No thyromegaly.  Cardiovascular:     Rate and Rhythm: Normal rate and regular rhythm.     Heart sounds: Normal heart sounds. No murmur heard. Pulmonary:     Effort: Pulmonary effort is normal. No respiratory  distress.     Breath sounds: Normal breath sounds. No wheezing.  Abdominal:     General: Bowel sounds are normal. There is no distension.     Palpations: Abdomen is soft.     Tenderness: There is no abdominal tenderness.  Musculoskeletal:        General: No tenderness. Normal range of motion.     Cervical back: Normal range of motion and neck supple.  Skin:    General: Skin is warm and dry.  Neurological:     Mental Status: She is alert and oriented to person, place, and time.     Cranial Nerves: No cranial nerve deficit.     Deep Tendon Reflexes: Reflexes are normal and symmetric.  Psychiatric:        Behavior: Behavior normal.        Thought Content: Thought content normal.        Judgment: Judgment normal.            Assessment & Plan:  Kristen Mejia comes in today with chief complaint of No chief complaint on file.   Diagnosis and orders addressed:  1. Dysuria (Primary) - Urinalysis, Complete - Urine Culture  2. Vaginal discharge - WET PREP FOR TRICH, YEAST, CLUE  3. Vagina, candidiasis Start diflucan   Probiotic  Cotton underwear - fluconazole  (DIFLUCAN ) 150 MG tablet; Take 1 tablet (150 mg total) by mouth every three (3) days as needed.  Dispense: 3 tablet; Refill: 0  4. BV (bacterial vaginosis) Start flayl  Probiotic  - metroNIDAZOLE  (FLAGYL ) 500 MG tablet; Take 1 tablet (500 mg total) by mouth 2 (two) times daily for 7 days.  Dispense: 14 tablet; Refill: 0     Bari Learn, FNP

## 2024-02-11 LAB — URINE CULTURE

## 2024-02-21 ENCOUNTER — Ambulatory Visit: Admitting: Women's Health

## 2024-03-01 ENCOUNTER — Encounter: Payer: Self-pay | Admitting: Women's Health

## 2024-03-08 ENCOUNTER — Encounter: Payer: Self-pay | Admitting: Women's Health

## 2024-03-08 ENCOUNTER — Ambulatory Visit: Admitting: Women's Health

## 2024-03-20 ENCOUNTER — Ambulatory Visit: Admitting: Women's Health

## 2024-03-23 ENCOUNTER — Ambulatory Visit (INDEPENDENT_AMBULATORY_CARE_PROVIDER_SITE_OTHER): Admitting: Family

## 2024-03-23 ENCOUNTER — Other Ambulatory Visit (HOSPITAL_COMMUNITY)
Admission: RE | Admit: 2024-03-23 | Discharge: 2024-03-23 | Disposition: A | Discharge status: Home / Self Care | Source: Ambulatory Visit | Attending: Family | Admitting: Family

## 2024-03-23 ENCOUNTER — Encounter: Payer: Self-pay | Admitting: Family

## 2024-03-23 VITALS — BP 116/78 | HR 86 | Temp 97.7°F | Ht 63.0 in | Wt 165.2 lb

## 2024-03-23 DIAGNOSIS — D649 Anemia, unspecified: Secondary | ICD-10-CM | POA: Diagnosis not present

## 2024-03-23 DIAGNOSIS — Z7289 Other problems related to lifestyle: Secondary | ICD-10-CM | POA: Diagnosis not present

## 2024-03-23 DIAGNOSIS — Z113 Encounter for screening for infections with a predominantly sexual mode of transmission: Secondary | ICD-10-CM | POA: Insufficient documentation

## 2024-03-23 DIAGNOSIS — Z1159 Encounter for screening for other viral diseases: Secondary | ICD-10-CM | POA: Diagnosis not present

## 2024-03-23 NOTE — Patient Instructions (Signed)
Iron Deficiency Anemia, Adult  Iron deficiency anemia is a condition in which the concentration of red blood cells or hemoglobin in the blood is below normal because of too little iron. Hemoglobin is a substance in red blood cells that carries oxygen to the body's tissues. When the concentration of red blood cells or hemoglobin is too low, not enough oxygen reaches these tissues. Iron deficiency anemia is usually long-lasting, and it develops over time. It may or may not cause symptoms. It is a common type of anemia. What are the causes? This condition may be caused by: Not enough iron in the diet. Abnormal absorption in the gut. Blood loss. What increases the risk? You are more likely to develop this condition if you get menstrual periods (menstruate) or are pregnant. What are the signs or symptoms? Symptoms of this condition may include: Pale skin, lips, and nail beds. Weakness, dizziness, and getting tired easily. Shortness of breath when moving or exercising. Cold hands or feet. Mild anemia may not cause any symptoms. How is this diagnosed? This condition is diagnosed based on: Your medical history. A physical exam. Blood tests. How is this treated? This condition is treated by correcting the cause of your iron deficiency. Treatment may involve: Adding iron-rich foods to your diet. Taking iron supplements. If you are pregnant or breastfeeding, you may need to take extra iron because your normal diet usually does not provide the amount of iron that you need. Increasing vitamin C intake. Vitamin C helps your body absorb iron. Your health care provider may recommend that you take iron supplements along with a glass of orange juice or a vitamin C supplement. Medicines to make heavy menstrual flow lighter. Surgery or additional testing procedures to determine the cause of your anemia. You may need repeat blood tests to determine whether treatment is working. If the treatment does not  seem to be working, you may need more tests. Follow these instructions at home: Medicines Take over-the-counter and prescription medicines only as told by your health care provider. This includes iron supplements and vitamins. This is important because too much iron can be harmful. For the best iron absorption, you should take iron supplements when your stomach is empty. If you cannot tolerate them on an empty stomach, you may need to take them with food. Do not drink milk or take antacids at the same time as your iron supplements. Milk and antacids may interfere with how your body absorbs iron. Iron supplements may turn stool (feces) a darker color and it may appear black. If you cannot tolerate taking iron supplements by mouth, talk with your health care provider about taking them through an IV or through an injection into a muscle. Eating and drinking Talk with your health care provider before changing your diet. Your provider may recommend that you eat foods that contain a lot of iron, such as: Liver. Low-fat (lean) beef. Breads and cereals that have iron added to them (are fortified). Eggs. Dried fruit. Dark green, leafy vegetables. To help your body use the iron from iron-rich foods, eat those foods at the same time as fresh fruits and vegetables that are high in vitamin C. Foods that are high in vitamin C include: Oranges. Peppers. Tomatoes. Mangoes. Managing constipation If you are taking an iron supplement, it may cause constipation. To prevent or treat constipation, you may need to: Drink enough fluid to keep your urine pale yellow. Take over-the-counter or prescription medicines. Eat foods that are high in fiber, such   as beans, whole grains, and fresh fruits and vegetables. Limit foods that are high in fat and processed sugars, such as fried or sweet foods. General instructions Return to your normal activities as told by your health care provider. Ask your health care provider  what activities are safe for you. Keep all follow-up visits. Contact a health care provider if: You feel nauseous or you vomit. You feel weak. You become light-headed when getting up from a sitting or lying down position. You have unexplained sweating. You develop symptoms of constipation. You have a heaviness in your chest. You have trouble breathing with physical activity. Get help right away if: You faint. If this happens, do not drive yourself to the hospital. You have an irregular or rapid heartbeat. Summary Iron deficiency anemia is a condition in which the concentration of red blood cells or hemoglobin in the blood is below normal because of too little iron. This condition is treated by correcting the cause of your iron deficiency. Take over-the-counter and prescription medicines only as told by your health care provider. This includes iron supplements and vitamins. To help your body use the iron from iron-rich foods, eat those foods at the same time as fresh fruits and vegetables that are high in vitamin C. Seek medical help if you have signs or symptoms of worsening anemia. This information is not intended to replace advice given to you by your health care provider. Make sure you discuss any questions you have with your health care provider. Document Revised: 09/02/2021 Document Reviewed: 09/02/2021 Elsevier Patient Education  2024 Elsevier Inc.  

## 2024-03-23 NOTE — Progress Notes (Signed)
 Subjective:    Patient ID: Kristen Mejia, female    DOB: 10-27-2000, 23 y.o.   MRN: [id number]  Chief Complaint  Patient presents with   std screening   PT presents to the office today to recheck iron and STD screening. Pt states she had her hgb checked and it was 9. Denies any blood in stools, urine.   She has had unprotected sex and wants to be tested for all STD's at this time.  Anemia Presents for follow-up visit. Symptoms include light-headedness, malaise/fatigue, pallor and palpitations. There has been no bruising/bleeding easily. Signs of blood loss that are not present include menorrhagia and vaginal bleeding.      Review of Systems  Constitutional:  Positive for malaise/fatigue.  Cardiovascular:  Positive for palpitations.  Genitourinary:  Negative for menorrhagia and vaginal bleeding.  Skin:  Positive for pallor.  Neurological:  Positive for light-headedness.  Hematological:  Does not bruise/bleed easily.  All other systems reviewed and are negative.   Social History   Socioeconomic History   Marital status: Single    Spouse name: Not on file   Number of children: Not on file   Years of education: Not on file   Highest education level: Some college, no degree  Occupational History   Not on file  Tobacco Use   Smoking status: Never    Passive exposure: Yes   Smokeless tobacco: Never  Vaping Use   Vaping status: Former   Substances: Nicotine, Flavoring  Substance and Sexual Activity   Alcohol use: No    Comment: occas   Drug use: No   Sexual activity: Yes    Birth control/protection: None  Other Topics Concern   Not on file  Social History Narrative   Not on file   Social Drivers of Health   Financial Resource Strain: Low Risk  (02/09/2024)   Overall Financial Resource Strain (CARDIA)    Difficulty of Paying Living Expenses: Not very hard  Food Insecurity: No Food Insecurity (02/09/2024)   Hunger Vital Sign    Worried About Running Out of Food in the  Last Year: Never true    Ran Out of Food in the Last Year: Never true  Transportation Needs: No Transportation Needs (02/09/2024)   PRAPARE - Administrator, Civil Service (Medical): No    Lack of Transportation (Non-Medical): No  Physical Activity: Insufficiently Active (02/09/2024)   Exercise Vital Sign    Days of Exercise per Week: 2 days    Minutes of Exercise per Session: 30 min  Stress: Stress Concern Present (02/09/2024)   [employer] of Occupational Health - Occupational Stress Questionnaire    Feeling of Stress: To some extent  Social Connections: Moderately Integrated (02/09/2024)   Social Connection and Isolation Panel    Frequency of Communication with Friends and Family: More than three times a week    Frequency of Social Gatherings with Friends and Family: Twice a week    Attends Religious Services: More than 4 times per year    Active Member of [employer] or Organizations: Yes    Attends Banker Meetings: Never    Marital Status: Never married   Family History  Problem Relation Age of Onset   Hypertension Mother    Hypertension Father    Hypothyroidism Maternal Aunt    Hypothyroidism Maternal Uncle    Hypothyroidism Maternal Grandmother    Hypothyroidism Maternal Grandfather    Diabetes Paternal Grandmother  Objective:   Physical Exam Vitals reviewed.  Constitutional:      General: She is not in acute distress.    Appearance: She is well-developed.  HENT:     Head: Normocephalic and atraumatic.  Eyes:     Pupils: Pupils are equal, round, and reactive to light.  Neck:     Thyroid : No thyromegaly.  Cardiovascular:     Rate and Rhythm: Normal rate and regular rhythm.     Heart sounds: Normal heart sounds. No murmur heard. Pulmonary:     Effort: Pulmonary effort is normal. No respiratory distress.     Breath sounds: Normal breath sounds. No wheezing.  Abdominal:     General: Bowel sounds are normal. There is no distension.      Palpations: Abdomen is soft.     Tenderness: There is no abdominal tenderness.  Musculoskeletal:        General: No tenderness. Normal range of motion.     Cervical back: Normal range of motion and neck supple.  Skin:    General: Skin is warm and dry.     Coloration: Skin is pale.  Neurological:     Mental Status: She is alert and oriented to person, place, and time.     Cranial Nerves: No cranial nerve deficit.     Deep Tendon Reflexes: Reflexes are normal and symmetric.  Psychiatric:        Behavior: Behavior normal.        Thought Content: Thought content normal.        Judgment: Judgment normal.       BP 116/78   Pulse 86   Temp 97.7 F (36.5 C) (Temporal)   Ht 5' 3 (1.6 m)   Wt 165 lb 3.2 oz (74.9 kg)   Breastfeeding No   BMI 29.26 kg/m      Assessment & Plan:  Kristen Mejia comes in today with chief complaint of std screening   Diagnosis and orders addressed:  1. Anemia, unspecified type (Primary) Labs pending  Iron rich diet - Anemia Profile B - CMP14+EGFR  2. Screening for STD (sexually transmitted disease) Safe sex STD screening  - CMP14+EGFR - Hepatitis C antibody - HIV Antibody (routine testing w rflx) - RPR - Urine cytology ancillary only   Labs pending Continue current medications  No follow-ups on file.    Bari Learn, FNP

## 2024-03-26 ENCOUNTER — Ambulatory Visit: Payer: Self-pay | Admitting: Family

## 2024-03-26 ENCOUNTER — Ambulatory Visit: Admitting: Nurse Practitioner

## 2024-03-26 ENCOUNTER — Telehealth: Payer: Self-pay

## 2024-03-26 DIAGNOSIS — E0590 Thyrotoxicosis, unspecified without thyrotoxic crisis or storm: Secondary | ICD-10-CM

## 2024-03-26 NOTE — Telephone Encounter (Signed)
 Lab called with critical result - HBG 8.8

## 2024-03-26 NOTE — Telephone Encounter (Signed)
 See result note.

## 2024-03-26 NOTE — Telephone Encounter (Signed)
 Copied from CRM #[id number]. Topic: Clinical - Lab/Test Results Will call once reviewed     >> Mar 26, 2024  1:58 PM Larissa S wrote: Reason for CRM: Patient requesting lab results, no provider note available at this time.

## 2024-03-27 LAB — ANEMIA PROFILE B
Basophils Absolute: 0 x10E3/uL (ref 0.0–0.2)
Basos: 0 %
EOS (ABSOLUTE): 0.2 x10E3/uL (ref 0.0–0.4)
Eos: 3 %
Ferritin: 10 ng/mL — AB (ref 15–150)
Folate: 4.6 ng/mL (ref >3.0)
Hematocrit: 30.4 % — ABNORMAL LOW (ref 34.0–46.6)
Hemoglobin: 8.8 g/dL — CL (ref 11.1–15.9)
Immature Grans (Abs): 0 x10E3/uL (ref 0.0–0.1)
Immature Granulocytes: 0 %
Iron Saturation: 4 — AB (ref 15–55)
Iron: 19 ug/dL — AB (ref 27–159)
Lymphocytes Absolute: 1.1 x10E3/uL (ref 0.7–3.1)
Lymphs: 18 %
MCH: 22.2 pg — ABNORMAL LOW (ref 26.6–33.0)
MCHC: 28.9 g/dL — ABNORMAL LOW (ref 31.5–35.7)
MCV: 77 fL — ABNORMAL LOW (ref 79–97)
Monocytes Absolute: 0.4 x10E3/uL (ref 0.1–0.9)
Monocytes: 7 %
Neutrophils Absolute: 4.1 x10E3/uL (ref 1.4–7.0)
Neutrophils: 72 %
Platelets: 410 x10E3/uL (ref 150–450)
RBC: 3.97 x10E6/uL (ref 3.77–5.28)
RDW: 16.1 % — ABNORMAL HIGH (ref 11.7–15.4)
Retic Ct Pct: 2.6 % (ref 0.6–2.6)
Total Iron Binding Capacity: 532 ug/dL — AB (ref 250–450)
UIBC: 513 ug/dL — AB (ref 131–425)
Vitamin B-12: 246 pg/mL (ref 232–1245)
WBC: 5.8 x10E3/uL (ref 3.4–10.8)

## 2024-03-27 LAB — CMP14+EGFR
ALT: 39 IU/L — AB (ref 0–32)
AST: 33 IU/L (ref 0–40)
Albumin: 4.5 g/dL (ref 4.0–5.0)
Alkaline Phosphatase: 159 IU/L — AB (ref 44–121)
BUN/Creatinine Ratio: 14 (ref 9–23)
BUN: 10 mg/dL (ref 6–20)
Bilirubin Total: 0.9 mg/dL (ref 0.0–1.2)
Calcium: 9.5 mg/dL (ref 8.7–10.2)
Chloride: 105 mmol/L (ref 96–106)
Creatinine, Ser: 0.72 mg/dL (ref 0.57–1.00)
Globulin, Total: 2.8 g/dL (ref 1.5–4.5)
Glucose: 88 mg/dL (ref 70–99)
Potassium: 4.2 mmol/L (ref 3.5–5.2)
Sodium: 138 mmol/L (ref 134–144)
Total Protein: 7.3 g/dL (ref 6.0–8.5)
eGFR: 120 mL/min/1.73 (ref >59)

## 2024-03-27 LAB — URINE CYTOLOGY ANCILLARY ONLY
Chlamydia: NEGATIVE
Comment: NEGATIVE
Comment: NEGATIVE
Comment: NORMAL
Neisseria Gonorrhea: NEGATIVE
Trichomonas: NEGATIVE

## 2024-03-27 LAB — RPR: RPR Ser Ql: NONREACTIVE

## 2024-03-27 LAB — HIV ANTIBODY (ROUTINE TESTING W REFLEX): HIV Screen 4th Generation wRfx: NONREACTIVE

## 2024-03-27 LAB — HEPATITIS C ANTIBODY: Hep C Virus Ab: NONREACTIVE

## 2024-03-28 ENCOUNTER — Ambulatory Visit (INDEPENDENT_AMBULATORY_CARE_PROVIDER_SITE_OTHER): Admitting: Advanced Practice Midwife

## 2024-03-28 ENCOUNTER — Encounter: Payer: Self-pay | Admitting: Advanced Practice Midwife

## 2024-03-28 DIAGNOSIS — Z3202 Encounter for pregnancy test, result negative: Secondary | ICD-10-CM | POA: Diagnosis not present

## 2024-03-28 DIAGNOSIS — O0990 Supervision of high risk pregnancy, unspecified, unspecified trimester: Secondary | ICD-10-CM

## 2024-03-28 DIAGNOSIS — E069 Thyroiditis, unspecified: Secondary | ICD-10-CM

## 2024-03-28 DIAGNOSIS — Z30013 Encounter for initial prescription of injectable contraceptive: Secondary | ICD-10-CM | POA: Diagnosis not present

## 2024-03-28 DIAGNOSIS — D649 Anemia, unspecified: Secondary | ICD-10-CM

## 2024-03-28 LAB — HSV DNA BY PCR (REFERENCE LAB)

## 2024-03-28 LAB — POCT URINE PREGNANCY: Preg Test, Ur: NEGATIVE

## 2024-03-28 LAB — SPECIMEN STATUS REPORT

## 2024-03-28 MED ORDER — MEDROXYPROGESTERONE ACETATE 150 MG/ML IM SUSY
150.0000 mg | PREFILLED_SYRINGE | Freq: Once | INTRAMUSCULAR | Status: AC
  Administered 2024-03-28: 150 mg via INTRAMUSCULAR

## 2024-03-28 MED ORDER — MEDROXYPROGESTERONE ACETATE 150 MG/ML IM SUSP
150.0000 mg | INTRAMUSCULAR | 3 refills | Status: AC
Start: 2024-03-28 — End: ?

## 2024-03-28 NOTE — Progress Notes (Signed)
 POSTPARTUM VISIT Patient name: Kristen Mejia MRN [id number]  Date of birth: 29-Apr-2001 Chief Complaint:   Postpartum Care (Vaginal delivery.had sex 2 week ago.period 03-19-14. Want depo)  History of Present Illness:   Kristen Mejia is a 23 y.o. G76P0101 Caucasian female being seen today for a postpartum visit. She is 10 weeks postpartum following a spontaneous vaginal delivery at 36.6 gestational weeks. IOL: yes, for preeclampsia w/o severe features. Anesthesia: epidural.  Laceration: none.  Complications: she presented at [redacted]w[redacted]d with ^BP and H/A (resolved w meds)= dx of pre-e and induced. Inpatient contraception: no.   Pregnancy complicated by thyroiditis; FGR; eventual pre-e without SF. Tobacco use: no. Substance use disorder: no. Last pap smear: Dec 2024 and results were NILM w/ HRHPV not done. Next pap smear due: Dec 2027 Patient's last menstrual period was 03/19/2024.  Postpartum course has been uncomplicated. Bleeding none. Bowel function is a little diarrhea recently. Bladder function is normal. Urinary incontinence? no, fecal incontinence? no Patient is sexually active. Last sexual activity: 2-3 weeks ago. Desired contraception: Depo at pp visit  . Patient does want a pregnancy in the future.  Desired family size is unsure number of children.   Upstream - 03/28/24 1408       Pregnancy Intention Screening   Does the patient want to become pregnant in the next year? No    Does the patient's partner want to become pregnant in the next year? No    Would the patient like to discuss contraceptive options today? Yes      Contraception Wrap Up   Current Method Hormonal Injection    End Method Hormonal Injection    Contraception Counseling Provided Yes         The pregnancy intention screening data noted above was reviewed. Potential methods of contraception were discussed. The patient elected to proceed with Hormonal Injection.  Edinburgh Postpartum Depression Screening: positive: H/O  mental health disorder: yes anxiety d/o. Currently on meds: no.  Currently in therapy: no.  Sleeping: as expected.  Appetite: nl.  Still finds joy in things she used to: Yes.  Support at home: not FOB, but grandma and dad.  SI/HI/II: no.  Interested in medicine: No.  Interested in therapy: No.  Edinburgh Postnatal Depression Scale - 03/28/24 1403       Edinburgh Postnatal Depression Scale:  In the Past 7 Days   I have been able to laugh and see the funny side of things. 1    I have looked forward with enjoyment to things. 1    I have blamed myself unnecessarily when things went wrong. 1    I have been anxious or worried for no good reason. 2    I have felt scared or panicky for no good reason. 1    Things have been getting on top of me. 1    I have been so unhappy that I have had difficulty sleeping. 2    I have felt sad or miserable. 1    I have been so unhappy that I have been crying. 1    The thought of harming myself has occurred to me. 0    Edinburgh Postnatal Depression Scale Total 11             10/14/2023    4:00 PM 07/27/2023   10:29 AM 06/10/2023    9:22 AM 02/07/2023    2:28 PM  GAD 7 : Generalized Anxiety Score  Nervous, Anxious, on Edge 0 0 0 0  Control/stop worrying 0 0 0 0  Worry too much - different things 0 0 0 0  Trouble relaxing 0 0 0 0  Restless 0 0 0 0  Easily annoyed or irritable 0 0 0 0  Afraid - awful might happen 0 0 0 0  Total GAD 7 Score 0 0 0 0  Anxiety Difficulty Not difficult at all  Not difficult at all Not difficult at all     Baby's course has been complicated by NICU stay x 6d due to prematurity. Baby is feeding by bottle. Infant has a pediatrician/family doctor? Yes.  Childcare strategy if returning to work/school: family and church daycare.  Pt has material needs met for her and baby: Yes.   Review of Systems:   Pertinent items are noted in HPI Denies Abnormal vaginal discharge w/ itching/odor/irritation, headaches, visual changes, shortness of  breath, chest pain, abdominal pain, severe nausea/vomiting, or problems with urination or bowel movements. Pertinent History Reviewed:  Reviewed past medical,surgical, obstetrical and family history.  Reviewed problem list, medications and allergies. OB History  Gravida Para Term Preterm AB Living  1 1  1  1   SAB IAB Ectopic Multiple Live Births     0 1    # Outcome Date GA Lbr Len/2nd Weight Sex Type Anes PTL Lv  1 Preterm 01/14/24 [redacted]w[redacted]d 03:42 / 00:14 5 lb 4 oz (2.38 kg) M Vag-Spont EPI  LIV   Physical Assessment:   Vitals:   03/28/24 1401  BP: 107/74  Pulse: 80  Weight: 162 lb 6.4 oz (73.7 kg)  Height: 5' 4 (1.626 m)  Body mass index is 27.88 kg/m.       Physical Examination:   General appearance: alert, well appearing, and in no distress  Mental status: alert, oriented to person, place, and time  Skin: warm & dry   Cardiovascular: normal heart rate noted   Respiratory: normal respiratory effort, no distress   Breasts: deferred, no complaints   Abdomen: soft, non-tender   Pelvic: examination not indicated. Thin prep pap obtained: No  Rectal: not examined  Extremities: Edema: none         Results for orders placed or performed in visit on 03/28/24 (from the past 24 hours)  POCT urine pregnancy   Collection Time: 03/28/24  2:30 PM  Result Value Ref Range   Preg Test, Ur Negative Negative    Assessment & Plan:  1) Postpartum exam 2) Ten wks s/p spontaneous vaginal delivery 3) bottle feeding 4) Depression screening: positive; mostly feels anxious; has good support and declines meds and therapy 5) Contraception management: starting on Depo today 6) s/p pre-e without SF: BP stable now 7) Anemia: PCP found Hgb to be 8.8 and she is working with Morna to improve and follow this up 8) Thyroiditis, followed by PCP  Essential components of care per ACOG recommendations:  1.  Mood and well being:  If positive depression screen, discussed and plan developed.  If using  tobacco we discussed reduction/cessation and risk of relapse If current substance abuse, we discussed and referral to local resources was offered.   2. Infant care and feeding:  If breastfeeding, discussed returning to work, pumping, breastfeeding-associated pain, guidance regarding return to fertility while lactating if not using another method. If needed, patient was provided with a letter to be allowed to pump q 2-3hrs to support lactation in a private location with access to a refrigerator to store breastmilk.   Recommended that all caregivers be immunized for  flu, pertussis and other preventable communicable diseases If pt does not have material needs met for her/baby, referred to local resources for help obtaining these.  3. Sexuality, contraception and birth spacing Provided guidance regarding sexuality, management of dyspareunia, and resumption of intercourse Discussed avoiding interpregnancy interval <66mths and recommended birth spacing of 18 months  4. Sleep and fatigue Discussed coping options for fatigue and sleep disruption Encouraged family/partner/community support of 4 hrs of uninterrupted sleep to help with mood and fatigue  5. Physical recovery  If pt had a C/S, assessed incisional pain and providing guidance on normal vs prolonged recovery If pt had a laceration, perineal healing and pain reviewed.  If urinary or fecal incontinence, discussed management and referred to PT or uro/gyn if indicated  Patient is safe to resume physical activity. Discussed attainment of healthy weight.  6.  Chronic disease management Discussed pregnancy complications if any, and their implications for future childbearing and long-term maternal health. Review recommendations for prevention of recurrent pregnancy complications, such as 17 hydroxyprogesterone caproate to reduce risk for recurrent PTB not applicable, or aspirin to reduce risk of preeclampsia yes. Pt had GDM: no. If yes, 2hr GTT  scheduled: not applicable. Reviewed medications and non-pregnant dosing including consideration of whether pt is breastfeeding using a reliable resource such as LactMed: not applicable Referred for f/u w/ PCP or subspecialist providers as indicated: has PCP Gery Learn, FNP)  7. Health maintenance Mammogram at 23yo or earlier if indicated Pap smears as indicated  Meds:  Meds ordered this encounter  Medications   medroxyPROGESTERone  (DEPO-PROVERA ) 150 MG/ML injection    Sig: Inject 1 mL (150 mg total) into the muscle every 3 (three) months.    Dispense:  1 mL    Refill:  3    Supervising Provider:   MARILYNN NEST [[id number]]    Follow-up: Return in about 3 months (around 06/28/2024) for Depo injection as RN visit.   Orders Placed This Encounter  Procedures   POCT urine pregnancy    Suzen JONETTA Gentry [facility] 03/28/2024 2:36 PM

## 2024-03-28 NOTE — Patient Instructions (Signed)
Use the website www.postpartum.net for helpful postpartum resources!

## 2024-04-21 DIAGNOSIS — Z20822 Contact with and (suspected) exposure to covid-19: Secondary | ICD-10-CM | POA: Diagnosis not present

## 2024-04-21 DIAGNOSIS — R070 Pain in throat: Secondary | ICD-10-CM | POA: Diagnosis not present

## 2024-04-21 DIAGNOSIS — U071 COVID-19: Secondary | ICD-10-CM | POA: Diagnosis not present

## 2024-05-01 ENCOUNTER — Encounter: Payer: Self-pay | Admitting: Nurse Practitioner

## 2024-05-01 ENCOUNTER — Telehealth: Payer: Self-pay

## 2024-05-01 ENCOUNTER — Telehealth: Payer: Self-pay | Admitting: *Deleted

## 2024-05-01 NOTE — Telephone Encounter (Signed)
 She wants to be added to cancellation list.  She will need those labs done before the visit though.

## 2024-05-01 NOTE — Telephone Encounter (Signed)
 Apt scheduled 05/07/2024

## 2024-05-01 NOTE — Telephone Encounter (Signed)
 Per [place] she has sent a message to the patient in [place].

## 2024-05-01 NOTE — Telephone Encounter (Signed)
 She sent me a message too.  I have asked that she go ahead and get her labs done and then we can go from there [place] reached out to her), whether it be adjusting meds over mychart until being seen at next available slot or overbooking for her sooner.

## 2024-05-01 NOTE — Telephone Encounter (Signed)
 Patient left a message. She states that she has appointment with Whitney on 06/28/24. She says that it as been 5-6 months since she was seen and the birth of her baby. She is losing large amounts of hair lately. She is asking if she could be seen sooner or if there was a cancellation.

## 2024-05-01 NOTE — Telephone Encounter (Signed)
 Copied from CRM [id number]. Topic: Clinical - Request for Lab/Test Order >> May 01, 2024 10:38 AM Montie POUR wrote: Reason for CRM:  Kristen Mejia would like to come in to have her thyroid  checked out due to her hair loss. Please send her a message through MyChart when she can come into labs.

## 2024-05-07 ENCOUNTER — Encounter: Payer: Self-pay | Admitting: Family

## 2024-05-07 ENCOUNTER — Ambulatory Visit: Admitting: Family

## 2024-05-07 VITALS — BP 124/79 | HR 99 | Temp 97.8°F | Ht 64.0 in | Wt 155.4 lb

## 2024-05-07 DIAGNOSIS — Z8349 Family history of other endocrine, nutritional and metabolic diseases: Secondary | ICD-10-CM | POA: Diagnosis not present

## 2024-05-07 DIAGNOSIS — E0590 Thyrotoxicosis, unspecified without thyrotoxic crisis or storm: Secondary | ICD-10-CM | POA: Diagnosis not present

## 2024-05-07 DIAGNOSIS — R5383 Other fatigue: Secondary | ICD-10-CM

## 2024-05-07 DIAGNOSIS — L659 Nonscarring hair loss, unspecified: Secondary | ICD-10-CM

## 2024-05-07 DIAGNOSIS — Z23 Encounter for immunization: Secondary | ICD-10-CM | POA: Diagnosis not present

## 2024-05-07 DIAGNOSIS — D509 Iron deficiency anemia, unspecified: Secondary | ICD-10-CM | POA: Diagnosis not present

## 2024-05-07 NOTE — Progress Notes (Signed)
 Subjective:    Patient ID: Kristen Mejia, female    DOB: May 29, 2001, 23 y.o.   MRN: [id number]  Chief Complaint  Patient presents with   Medical Management of Chronic Issues   Pt presents to the office today to recheck iron. She states she is feeling fatigued and having hair loss. She never started her oral iron.   Requesting her thyroid  be checked today. She did have a baby 01/14/24. She is followed by Endocrinologists for hyperthyroidism.  Anemia Presents for follow-up visit. Symptoms include malaise/fatigue and pica. There has been no confusion, fever, leg swelling or light-headedness.  Thyroid  Problem Presents for follow-up visit. Symptoms include fatigue and hair loss. Patient reports no diarrhea. The symptoms have been stable.      Review of Systems  Constitutional:  Positive for fatigue and malaise/fatigue. Negative for fever.  Gastrointestinal:  Negative for diarrhea.  Neurological:  Negative for light-headedness.  Psychiatric/Behavioral:  Negative for confusion.   All other systems reviewed and are negative.   Social History   Socioeconomic History   Marital status: Single    Spouse name: Not on file   Number of children: Not on file   Years of education: Not on file   Highest education level: Some college, no degree  Occupational History   Not on file  Tobacco Use   Smoking status: Never    Passive exposure: Yes   Smokeless tobacco: Never  Vaping Use   Vaping status: Former   Substances: Nicotine, Flavoring  Substance and Sexual Activity   Alcohol use: No    Comment: occas   Drug use: No   Sexual activity: Yes    Birth control/protection: None  Other Topics Concern   Not on file  Social History Narrative   Not on file   Social Drivers of Health   Financial Resource Strain: Low Risk  (02/09/2024)   Overall Financial Resource Strain (CARDIA)    Difficulty of Paying Living Expenses: Not very hard  Food Insecurity: No Food Insecurity (02/09/2024)    Hunger Vital Sign    Worried About Running Out of Food in the Last Year: Never true    Ran Out of Food in the Last Year: Never true  Transportation Needs: No Transportation Needs (02/09/2024)   PRAPARE - Administrator, Civil Service (Medical): No    Lack of Transportation (Non-Medical): No  Physical Activity: Insufficiently Active (02/09/2024)   Exercise Vital Sign    Days of Exercise per Week: 2 days    Minutes of Exercise per Session: 30 min  Stress: Stress Concern Present (02/09/2024)   [employer] of Occupational Health - Occupational Stress Questionnaire    Feeling of Stress: To some extent  Social Connections: Moderately Integrated (02/09/2024)   Social Connection and Isolation Panel    Frequency of Communication with Friends and Family: More than three times a week    Frequency of Social Gatherings with Friends and Family: Twice a week    Attends Religious Services: More than 4 times per year    Active Member of [employer] or Organizations: Yes    Attends Banker Meetings: Never    Marital Status: Never married   Family History  Problem Relation Age of Onset   Hypertension Mother    Hypertension Father    Hypothyroidism Maternal Aunt    Hypothyroidism Maternal Uncle    Hypothyroidism Maternal Grandmother    Hypothyroidism Maternal Grandfather    Diabetes Paternal Grandmother  Objective:   Physical Exam Vitals reviewed.  Constitutional:      General: She is not in acute distress.    Appearance: She is well-developed.  HENT:     Head: Normocephalic and atraumatic.     Right Ear: Tympanic membrane normal.     Left Ear: Tympanic membrane normal.  Eyes:     Pupils: Pupils are equal, round, and reactive to light.  Neck:     Thyroid : No thyromegaly.  Cardiovascular:     Rate and Rhythm: Normal rate and regular rhythm.     Heart sounds: Normal heart sounds. No murmur heard. Pulmonary:     Effort: Pulmonary effort is normal. No respiratory  distress.     Breath sounds: Normal breath sounds. No wheezing.  Abdominal:     General: Bowel sounds are normal. There is no distension.     Palpations: Abdomen is soft.     Tenderness: There is no abdominal tenderness.  Musculoskeletal:        General: No tenderness. Normal range of motion.     Cervical back: Normal range of motion and neck supple.  Skin:    General: Skin is warm and dry.  Neurological:     Mental Status: She is alert and oriented to person, place, and time.     Cranial Nerves: No cranial nerve deficit.     Deep Tendon Reflexes: Reflexes are normal and symmetric.  Psychiatric:        Behavior: Behavior normal.        Thought Content: Thought content normal.        Judgment: Judgment normal.       BP 124/79   Pulse 99   Temp 97.8 F (36.6 C) (Temporal)   Ht 5' 4 (1.626 m)   Wt 155 lb 6.4 oz (70.5 kg)   Breastfeeding No   BMI 26.67 kg/m      Assessment & Plan:  [name] comes in today with chief complaint of Medical Management of Chronic Issues   Diagnosis and orders addressed:  1. Encounter for immunization - Flu vaccine trivalent PF, 6mos and older(Flulaval,Afluria,Fluarix,Fluzone) - CMP14+EGFR  2. Hyperthyroidism Labs pending  - CMP14+EGFR  3. Iron deficiency anemia, unspecified iron deficiency anemia type (Primary) Iron rich diet  Start oral iron daily with stool softener  - Anemia Profile B - CMP14+EGFR  4. Other fatigue  5. Hair loss   Labs pending Continue current medications  Keep follow up with specialists  Health Maintenance reviewed Diet and exercise encouraged  Return if symptoms worsen or fail to improve.    Bari Learn, FNP

## 2024-05-07 NOTE — Patient Instructions (Signed)
Iron Deficiency Anemia, Adult  Iron deficiency anemia is a condition in which the concentration of red blood cells or hemoglobin in the blood is below normal because of too little iron. Hemoglobin is a substance in red blood cells that carries oxygen to the body's tissues. When the concentration of red blood cells or hemoglobin is too low, not enough oxygen reaches these tissues. Iron deficiency anemia is usually long-lasting, and it develops over time. It may or may not cause symptoms. It is a common type of anemia. What are the causes? This condition may be caused by: Not enough iron in the diet. Abnormal absorption in the gut. Blood loss. What increases the risk? You are more likely to develop this condition if you get menstrual periods (menstruate) or are pregnant. What are the signs or symptoms? Symptoms of this condition may include: Pale skin, lips, and nail beds. Weakness, dizziness, and getting tired easily. Shortness of breath when moving or exercising. Cold hands or feet. Mild anemia may not cause any symptoms. How is this diagnosed? This condition is diagnosed based on: Your medical history. A physical exam. Blood tests. How is this treated? This condition is treated by correcting the cause of your iron deficiency. Treatment may involve: Adding iron-rich foods to your diet. Taking iron supplements. If you are pregnant or breastfeeding, you may need to take extra iron because your normal diet usually does not provide the amount of iron that you need. Increasing vitamin C intake. Vitamin C helps your body absorb iron. Your health care provider may recommend that you take iron supplements along with a glass of orange juice or a vitamin C supplement. Medicines to make heavy menstrual flow lighter. Surgery or additional testing procedures to determine the cause of your anemia. You may need repeat blood tests to determine whether treatment is working. If the treatment does not  seem to be working, you may need more tests. Follow these instructions at home: Medicines Take over-the-counter and prescription medicines only as told by your health care provider. This includes iron supplements and vitamins. This is important because too much iron can be harmful. For the best iron absorption, you should take iron supplements when your stomach is empty. If you cannot tolerate them on an empty stomach, you may need to take them with food. Do not drink milk or take antacids at the same time as your iron supplements. Milk and antacids may interfere with how your body absorbs iron. Iron supplements may turn stool (feces) a darker color and it may appear black. If you cannot tolerate taking iron supplements by mouth, talk with your health care provider about taking them through an IV or through an injection into a muscle. Eating and drinking Talk with your health care provider before changing your diet. Your provider may recommend that you eat foods that contain a lot of iron, such as: Liver. Low-fat (lean) beef. Breads and cereals that have iron added to them (are fortified). Eggs. Dried fruit. Dark green, leafy vegetables. To help your body use the iron from iron-rich foods, eat those foods at the same time as fresh fruits and vegetables that are high in vitamin C. Foods that are high in vitamin C include: Oranges. Peppers. Tomatoes. Mangoes. Managing constipation If you are taking an iron supplement, it may cause constipation. To prevent or treat constipation, you may need to: Drink enough fluid to keep your urine pale yellow. Take over-the-counter or prescription medicines. Eat foods that are high in fiber, such   as beans, whole grains, and fresh fruits and vegetables. Limit foods that are high in fat and processed sugars, such as fried or sweet foods. General instructions Return to your normal activities as told by your health care provider. Ask your health care provider  what activities are safe for you. Keep all follow-up visits. Contact a health care provider if: You feel nauseous or you vomit. You feel weak. You become light-headed when getting up from a sitting or lying down position. You have unexplained sweating. You develop symptoms of constipation. You have a heaviness in your chest. You have trouble breathing with physical activity. Get help right away if: You faint. If this happens, do not drive yourself to the hospital. You have an irregular or rapid heartbeat. Summary Iron deficiency anemia is a condition in which the concentration of red blood cells or hemoglobin in the blood is below normal because of too little iron. This condition is treated by correcting the cause of your iron deficiency. Take over-the-counter and prescription medicines only as told by your health care provider. This includes iron supplements and vitamins. To help your body use the iron from iron-rich foods, eat those foods at the same time as fresh fruits and vegetables that are high in vitamin C. Seek medical help if you have signs or symptoms of worsening anemia. This information is not intended to replace advice given to you by your health care provider. Make sure you discuss any questions you have with your health care provider. Document Revised: 09/02/2021 Document Reviewed: 09/02/2021 Elsevier Patient Education  2024 Elsevier Inc.  

## 2024-05-08 ENCOUNTER — Ambulatory Visit: Payer: Self-pay | Admitting: Family

## 2024-05-08 ENCOUNTER — Encounter: Payer: Self-pay | Admitting: Nurse Practitioner

## 2024-05-08 ENCOUNTER — Ambulatory Visit (INDEPENDENT_AMBULATORY_CARE_PROVIDER_SITE_OTHER): Admitting: Nurse Practitioner

## 2024-05-08 ENCOUNTER — Ambulatory Visit: Payer: Self-pay | Admitting: Nurse Practitioner

## 2024-05-08 VITALS — BP 98/62 | HR 102 | Ht 64.0 in | Wt 157.2 lb

## 2024-05-08 DIAGNOSIS — Z8349 Family history of other endocrine, nutritional and metabolic diseases: Secondary | ICD-10-CM | POA: Diagnosis not present

## 2024-05-08 DIAGNOSIS — E0590 Thyrotoxicosis, unspecified without thyrotoxic crisis or storm: Secondary | ICD-10-CM | POA: Diagnosis not present

## 2024-05-08 LAB — CMP14+EGFR
ALT: 41 IU/L — AB (ref 0–32)
AST: 26 IU/L (ref 0–40)
Albumin: 4.3 g/dL (ref 4.0–5.0)
Alkaline Phosphatase: 118 IU/L — AB (ref 41–116)
BUN/Creatinine Ratio: 18 (ref 9–23)
BUN: 10 mg/dL (ref 6–20)
Bilirubin Total: 1.2 mg/dL (ref 0.0–1.2)
CO2: 19 mmol/L — AB (ref 20–29)
Calcium: 9.8 mg/dL (ref 8.7–10.2)
Chloride: 106 mmol/L (ref 96–106)
Creatinine, Ser: 0.56 mg/dL — AB (ref 0.57–1.00)
Globulin, Total: 2.6 g/dL (ref 1.5–4.5)
Glucose: 95 mg/dL (ref 70–99)
Potassium: 4.2 mmol/L (ref 3.5–5.2)
Sodium: 138 mmol/L (ref 134–144)
Total Protein: 6.9 g/dL (ref 6.0–8.5)
eGFR: 131 mL/min/1.73 (ref >59)

## 2024-05-08 LAB — T3, FREE: T3, Free: 19.8 pg/mL — ABNORMAL HIGH (ref 2.0–4.4)

## 2024-05-08 LAB — ANEMIA PROFILE B
Basophils Absolute: 0 x10E3/uL (ref 0.0–0.2)
Basos: 0 %
EOS (ABSOLUTE): 0.2 x10E3/uL (ref 0.0–0.4)
Eos: 3 %
Ferritin: 13 ng/mL — AB (ref 15–150)
Folate: 6.8 ng/mL (ref >3.0)
Hematocrit: 36.9 % (ref 34.0–46.6)
Hemoglobin: 10.7 g/dL — ABNORMAL LOW (ref 11.1–15.9)
Immature Grans (Abs): 0 x10E3/uL (ref 0.0–0.1)
Immature Granulocytes: 0 %
Iron Saturation: 6 % — AB (ref 15–55)
Iron: 25 ug/dL — AB (ref 27–159)
Lymphocytes Absolute: 2 x10E3/uL (ref 0.7–3.1)
Lymphs: 39 %
MCH: 21.5 pg — ABNORMAL LOW (ref 26.6–33.0)
MCHC: 29 g/dL — ABNORMAL LOW (ref 31.5–35.7)
MCV: 74 fL — ABNORMAL LOW (ref 79–97)
Monocytes Absolute: 0.5 x10E3/uL (ref 0.1–0.9)
Monocytes: 11 %
Neutrophils Absolute: 2.4 x10E3/uL (ref 1.4–7.0)
Neutrophils: 47 %
Platelets: 374 x10E3/uL (ref 150–450)
RBC: 4.97 x10E6/uL (ref 3.77–5.28)
RDW: 16.5 % — ABNORMAL HIGH (ref 11.7–15.4)
Retic Ct Pct: 1.8 % (ref 0.6–2.6)
Total Iron Binding Capacity: 454 ug/dL — AB (ref 250–450)
UIBC: 429 ug/dL — AB (ref 131–425)
Vitamin B-12: 308 pg/mL (ref 232–1245)
WBC: 5 x10E3/uL (ref 3.4–10.8)

## 2024-05-08 LAB — TSH: TSH: <0.005 u[IU]/mL — ABNORMAL LOW (ref 0.450–4.500)

## 2024-05-08 LAB — T4, FREE: Free T4: 4.84 ng/dL — ABNORMAL HIGH (ref 0.82–1.77)

## 2024-05-08 MED ORDER — PROPRANOLOL HCL 20 MG PO TABS
20.0000 mg | ORAL_TABLET | Freq: Two times a day (BID) | ORAL | 1 refills | Status: DC
Start: 2024-05-08 — End: 2024-07-03

## 2024-05-08 MED ORDER — METHIMAZOLE 10 MG PO TABS: 10.0000 mg | ORAL_TABLET | Freq: Two times a day (BID) | ORAL | 1 refills | Status: DC
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
## 2024-05-08 NOTE — Progress Notes (Signed)
Pt is coming this afternoon.
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
## 2024-05-08 NOTE — Progress Notes (Signed)
 Need to get her on the schedule ASAP, we can overbook.  Labs show she is way overactive.

## 2024-05-30 ENCOUNTER — Encounter: Payer: Self-pay | Admitting: Women's Health

## 2024-06-20 ENCOUNTER — Ambulatory Visit

## 2024-06-21 ENCOUNTER — Ambulatory Visit (INDEPENDENT_AMBULATORY_CARE_PROVIDER_SITE_OTHER): Admitting: *Deleted

## 2024-06-21 DIAGNOSIS — Z3042 Encounter for surveillance of injectable contraceptive: Secondary | ICD-10-CM

## 2024-06-21 MED ORDER — MEDROXYPROGESTERONE ACETATE 150 MG/ML IM SUSP
150.0000 mg | Freq: Once | INTRAMUSCULAR | Status: AC
  Administered 2024-06-21: 150 mg via INTRAMUSCULAR

## 2024-06-21 NOTE — Progress Notes (Signed)
   NURSE VISIT- INJECTION  SUBJECTIVE:  Kristen Mejia is a 23 y.o. G2P0101 female here for a Depo Provera  for contraception/period management. She is a GYN patient.   OBJECTIVE:  There were no vitals taken for this visit.  Appears well, in no apparent distress  Injection administered in: Right deltoid  Meds ordered this encounter  Medications   medroxyPROGESTERone  (DEPO-PROVERA ) injection 150 mg    ASSESSMENT: GYN patient Depo Provera  for contraception/period management PLAN: Follow-up: in 11-13 weeks for next Depo   Rutherford Rover  06/21/2024 3:28 PM

## 2024-06-28 ENCOUNTER — Ambulatory Visit: Admitting: Nurse Practitioner

## 2024-07-02 ENCOUNTER — Other Ambulatory Visit

## 2024-07-02 DIAGNOSIS — E0590 Thyrotoxicosis, unspecified without thyrotoxic crisis or storm: Secondary | ICD-10-CM | POA: Diagnosis not present

## 2024-07-02 DIAGNOSIS — Z8349 Family history of other endocrine, nutritional and metabolic diseases: Secondary | ICD-10-CM | POA: Diagnosis not present

## 2024-07-03 ENCOUNTER — Ambulatory Visit: Payer: Self-pay

## 2024-07-03 ENCOUNTER — Encounter: Payer: Self-pay | Admitting: Nurse Practitioner

## 2024-07-03 ENCOUNTER — Ambulatory Visit (INDEPENDENT_AMBULATORY_CARE_PROVIDER_SITE_OTHER): Admitting: Nurse Practitioner

## 2024-07-03 VITALS — BP 102/80 | HR 98 | Ht 64.0 in | Wt 162.8 lb

## 2024-07-03 DIAGNOSIS — Z8349 Family history of other endocrine, nutritional and metabolic diseases: Secondary | ICD-10-CM | POA: Diagnosis not present

## 2024-07-03 DIAGNOSIS — E0590 Thyrotoxicosis, unspecified without thyrotoxic crisis or storm: Secondary | ICD-10-CM | POA: Diagnosis not present

## 2024-07-03 LAB — TSH: TSH: <0.005 u[IU]/mL — ABNORMAL LOW (ref 0.450–4.500)

## 2024-07-03 LAB — T3, FREE: T3, Free: 9.6 pg/mL — ABNORMAL HIGH (ref 2.0–4.4)

## 2024-07-03 LAB — T4, FREE: Free T4: 3.25 ng/dL — ABNORMAL HIGH (ref 0.82–1.77)

## 2024-07-03 MED ORDER — METHIMAZOLE 10 MG PO TABS: 10.0000 mg | ORAL_TABLET | Freq: Three times a day (TID) | ORAL | 1 refills | Status: AC

## 2024-07-03 MED ORDER — PROPRANOLOL HCL 20 MG PO TABS: 20.0000 mg | ORAL_TABLET | Freq: Two times a day (BID) | ORAL | 1 refills | Status: AC

## 2024-07-03 NOTE — Telephone Encounter (Addendum)
 Duplicate CRM, please see other nurse triage encounter.

## 2024-07-03 NOTE — Telephone Encounter (Signed)
 Copied from CRM [id number]. Topic: Clinical - Pink Word Triage >> Jul 03, 2024  3:17 PM Joesph NOVAK wrote: Dawne Word triggered transfer to Nurse Triage. See Triage Message for details. >> Jul 03, 2024  3:18 PM Joesph B wrote: Reason for Triage: Abdominal pain, diarrhea, vomiting. Patient wants to come in for a appointment today.  Phone call placed to patient-no answer. Unable to leave a voicemail as mailbox is full. Will place in call back folder.
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
## 2024-07-03 NOTE — Telephone Encounter (Signed)
 Phone call placed to patient for triage. No answer. Unable to leave a voicemail as mailbox is full. Will route to office.
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
## 2024-07-04 ENCOUNTER — Encounter: Payer: Self-pay | Admitting: Family

## 2024-07-04 ENCOUNTER — Ambulatory Visit: Admitting: Family Medicine

## 2024-08-17 ENCOUNTER — Ambulatory Visit: Admitting: Family Medicine

## 2024-08-22 ENCOUNTER — Ambulatory Visit

## 2024-08-22 ENCOUNTER — Encounter: Payer: Self-pay | Admitting: Family Medicine

## 2024-08-22 VITALS — BP 110/58 | HR 72 | Temp 97.9°F | Ht 64.0 in | Wt 173.2 lb

## 2024-08-22 DIAGNOSIS — J3089 Other allergic rhinitis: Secondary | ICD-10-CM | POA: Diagnosis not present

## 2024-08-22 MED ORDER — FLUTICASONE PROPIONATE 50 MCG/ACT NA SUSP: 2.0000 | Freq: Every day | NASAL | 6 refills | Status: AC

## 2024-08-22 MED ORDER — LEVOCETIRIZINE DIHYDROCHLORIDE 5 MG PO TABS: 5.0000 mg | ORAL_TABLET | Freq: Every evening | ORAL | 2 refills | Status: AC
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
## 2024-08-28 ENCOUNTER — Telehealth: Admitting: Nurse Practitioner

## 2024-09-03 ENCOUNTER — Telehealth: Admitting: Nurse Practitioner

## 2024-09-13 ENCOUNTER — Ambulatory Visit

## 2024-09-13 ENCOUNTER — Encounter: Payer: Self-pay | Admitting: Women's Health

## 2024-09-14 NOTE — Telephone Encounter (Signed)
 Oh my, it is really important for her to take the medication as prescribed and have her follow ups here.  An over-active is dangerous to her health, could lead to a heart attack or [email or web address]  But yes, we can reschedule for about 4 weeks out or so.

## 2024-10-17 ENCOUNTER — Telehealth: Admitting: Nurse Practitioner
